# Patient Record
Sex: Female | Born: 1935
Health system: Southern US, Community
[De-identification: ages and names within clinical notes are randomized; demographics above are authoritative.]

## PROBLEM LIST (undated history)

## (undated) DIAGNOSIS — M8580 Other specified disorders of bone density and structure, unspecified site: Secondary | ICD-10-CM

## (undated) DIAGNOSIS — E785 Hyperlipidemia, unspecified: Secondary | ICD-10-CM

## (undated) DIAGNOSIS — I10 Essential (primary) hypertension: Secondary | ICD-10-CM

## (undated) DIAGNOSIS — N2889 Other specified disorders of kidney and ureter: Secondary | ICD-10-CM

## (undated) DIAGNOSIS — N261 Atrophy of kidney (terminal): Secondary | ICD-10-CM

## (undated) HISTORY — DX: Atrophy of kidney (terminal): N26.1

## (undated) HISTORY — PX: OTHER SURGICAL HISTORY: SHX169

## (undated) HISTORY — DX: Hyperlipidemia, unspecified: E78.5

## (undated) HISTORY — DX: Essential (primary) hypertension: I10

## (undated) HISTORY — DX: Other specified disorders of bone density and structure, unspecified site: M85.80

## (undated) HISTORY — DX: Other specified disorders of kidney and ureter: N28.89

## (undated) HISTORY — PX: CATARACT EXTRACTION: SUR2

---

## 2000-09-04 ENCOUNTER — Encounter: Admission: RE | Admit: 2000-09-04 | Discharge: 2000-12-03 | Payer: Self-pay | Admitting: Internal Medicine

## 2002-11-03 ENCOUNTER — Other Ambulatory Visit: Admission: RE | Admit: 2002-11-03 | Discharge: 2002-11-03 | Payer: Self-pay | Admitting: Dermatology

## 2003-09-16 LAB — HM COLONOSCOPY

## 2004-09-01 ENCOUNTER — Ambulatory Visit: Payer: Self-pay | Admitting: Internal Medicine

## 2004-09-16 ENCOUNTER — Ambulatory Visit: Payer: Self-pay | Admitting: Internal Medicine

## 2005-01-19 ENCOUNTER — Ambulatory Visit: Payer: Self-pay | Admitting: Internal Medicine

## 2005-02-16 ENCOUNTER — Ambulatory Visit: Payer: Self-pay | Admitting: Internal Medicine

## 2005-05-17 ENCOUNTER — Ambulatory Visit: Payer: Self-pay | Admitting: Internal Medicine

## 2005-05-30 ENCOUNTER — Ambulatory Visit: Payer: Self-pay | Admitting: Internal Medicine

## 2005-06-01 ENCOUNTER — Ambulatory Visit: Payer: Self-pay

## 2005-06-08 ENCOUNTER — Ambulatory Visit: Payer: Self-pay | Admitting: Internal Medicine

## 2005-09-26 ENCOUNTER — Ambulatory Visit: Payer: Self-pay | Admitting: Internal Medicine

## 2005-10-02 ENCOUNTER — Ambulatory Visit: Payer: Self-pay | Admitting: Internal Medicine

## 2005-10-23 ENCOUNTER — Ambulatory Visit: Payer: Self-pay | Admitting: Internal Medicine

## 2005-11-27 ENCOUNTER — Ambulatory Visit: Payer: Self-pay | Admitting: Internal Medicine

## 2005-12-06 ENCOUNTER — Ambulatory Visit: Payer: Self-pay | Admitting: Internal Medicine

## 2005-12-08 ENCOUNTER — Ambulatory Visit: Payer: Self-pay | Admitting: Internal Medicine

## 2005-12-12 ENCOUNTER — Ambulatory Visit: Payer: Self-pay | Admitting: Internal Medicine

## 2005-12-16 ENCOUNTER — Encounter: Admission: RE | Admit: 2005-12-16 | Discharge: 2005-12-16 | Payer: Self-pay | Admitting: Internal Medicine

## 2006-04-04 ENCOUNTER — Ambulatory Visit: Payer: Self-pay | Admitting: Internal Medicine

## 2006-07-18 ENCOUNTER — Ambulatory Visit: Payer: Self-pay | Admitting: Internal Medicine

## 2006-07-25 ENCOUNTER — Encounter: Admission: RE | Admit: 2006-07-25 | Discharge: 2006-07-25 | Payer: Self-pay | Admitting: Internal Medicine

## 2006-08-08 ENCOUNTER — Ambulatory Visit: Payer: Self-pay | Admitting: Internal Medicine

## 2006-10-31 ENCOUNTER — Ambulatory Visit: Payer: Self-pay | Admitting: Internal Medicine

## 2006-10-31 LAB — CONVERTED CEMR LAB
ALT: 26 units/L (ref 0–40)
AST: 24 units/L (ref 0–37)
Albumin: 4.4 g/dL (ref 3.5–5.2)
Alkaline Phosphatase: 62 units/L (ref 39–117)
BUN: 21 mg/dL (ref 6–23)
Bilirubin, Direct: 0.2 mg/dL (ref 0.0–0.3)
CO2: 29 meq/L (ref 19–32)
Calcium: 10 mg/dL (ref 8.4–10.5)
Chloride: 102 meq/L (ref 96–112)
Cholesterol: 182 mg/dL (ref 0–200)
Creatinine, Ser: 1 mg/dL (ref 0.4–1.2)
GFR calc Af Amer: 69 mL/min
GFR calc non Af Amer: 57 mL/min
Glucose, Bld: 150 mg/dL — ABNORMAL HIGH (ref 70–99)
HDL: 41.9 mg/dL (ref >39.0)
Hgb A1c MFr Bld: 6.9 % — ABNORMAL HIGH (ref 4.6–6.0)
LDL Cholesterol: 103 mg/dL — ABNORMAL HIGH (ref 0–99)
Potassium: 4.6 meq/L (ref 3.5–5.1)
Sodium: 140 meq/L (ref 135–145)
Total Bilirubin: 1.1 mg/dL (ref 0.3–1.2)
Total CHOL/HDL Ratio: 4.3
Total Protein: 7.2 g/dL (ref 6.0–8.3)
Triglycerides: 188 mg/dL — ABNORMAL HIGH (ref 0–149)
VLDL: 38 mg/dL (ref 0–40)

## 2006-11-07 ENCOUNTER — Ambulatory Visit: Payer: Self-pay | Admitting: Internal Medicine

## 2006-12-05 ENCOUNTER — Ambulatory Visit: Payer: Self-pay | Admitting: Internal Medicine

## 2007-02-11 ENCOUNTER — Ambulatory Visit: Payer: Self-pay | Admitting: Internal Medicine

## 2007-02-11 LAB — CONVERTED CEMR LAB
ALT: 20 units/L (ref 0–40)
AST: 21 units/L (ref 0–37)
Albumin: 4 g/dL (ref 3.5–5.2)
Alkaline Phosphatase: 72 units/L (ref 39–117)
BUN: 18 mg/dL (ref 6–23)
Bilirubin, Direct: 0.1 mg/dL (ref 0.0–0.3)
CO2: 31 meq/L (ref 19–32)
Calcium: 9.6 mg/dL (ref 8.4–10.5)
Chloride: 109 meq/L (ref 96–112)
Cholesterol: 289 mg/dL (ref 0–200)
Creatinine, Ser: 0.8 mg/dL (ref 0.4–1.2)
Creatinine,U: 30.7 mg/dL
Direct LDL: 201.3 mg/dL
GFR calc Af Amer: 89 mL/min
GFR calc non Af Amer: 74 mL/min
Glucose, Bld: 118 mg/dL — ABNORMAL HIGH (ref 70–99)
HDL: 39.6 mg/dL (ref >39.0)
Hgb A1c MFr Bld: 6.7 % — ABNORMAL HIGH (ref 4.6–6.0)
Microalb Creat Ratio: 9.8 mg/g (ref 0.0–30.0)
Microalb, Ur: 0.3 mg/dL (ref 0.0–1.9)
Potassium: 4 meq/L (ref 3.5–5.1)
Sodium: 146 meq/L — ABNORMAL HIGH (ref 135–145)
Total Bilirubin: 0.7 mg/dL (ref 0.3–1.2)
Total CHOL/HDL Ratio: 7.3
Total Protein: 7.5 g/dL (ref 6.0–8.3)
Triglycerides: 257 mg/dL (ref 0–149)
VLDL: 51 mg/dL — ABNORMAL HIGH (ref 0–40)

## 2007-04-18 DIAGNOSIS — M8580 Other specified disorders of bone density and structure, unspecified site: Secondary | ICD-10-CM

## 2007-04-18 DIAGNOSIS — I10 Essential (primary) hypertension: Secondary | ICD-10-CM | POA: Insufficient documentation

## 2007-04-18 DIAGNOSIS — E119 Type 2 diabetes mellitus without complications: Secondary | ICD-10-CM

## 2007-04-18 DIAGNOSIS — E785 Hyperlipidemia, unspecified: Secondary | ICD-10-CM

## 2007-06-21 ENCOUNTER — Ambulatory Visit: Payer: Self-pay | Admitting: Internal Medicine

## 2007-06-24 ENCOUNTER — Ambulatory Visit: Payer: Self-pay | Admitting: Cardiology

## 2007-06-24 LAB — CONVERTED CEMR LAB
ALT: 25 units/L (ref 0–35)
AST: 24 units/L (ref 0–37)
Albumin: 4.3 g/dL (ref 3.5–5.2)
Alkaline Phosphatase: 61 units/L (ref 39–117)
BUN: 25 mg/dL — ABNORMAL HIGH (ref 6–23)
Bilirubin, Direct: 0.1 mg/dL (ref 0.0–0.3)
CO2: 27 meq/L (ref 19–32)
Calcium: 9.7 mg/dL (ref 8.4–10.5)
Chloride: 103 meq/L (ref 96–112)
Cholesterol: 241 mg/dL (ref 0–200)
Creatinine, Ser: 0.8 mg/dL (ref 0.4–1.2)
Direct LDL: 183.9 mg/dL
GFR calc Af Amer: 89 mL/min
GFR calc non Af Amer: 74 mL/min
Glucose, Bld: 139 mg/dL — ABNORMAL HIGH (ref 70–99)
HDL: 39 mg/dL (ref >39.0)
Hgb A1c MFr Bld: 6.7 % — ABNORMAL HIGH (ref 4.6–6.0)
Potassium: 4.3 meq/L (ref 3.5–5.1)
Sodium: 139 meq/L (ref 135–145)
Total Bilirubin: 1.1 mg/dL (ref 0.3–1.2)
Total CHOL/HDL Ratio: 6.2
Total Protein: 7.3 g/dL (ref 6.0–8.3)
Triglycerides: 129 mg/dL (ref 0–149)
VLDL: 26 mg/dL (ref 0–40)

## 2007-07-11 ENCOUNTER — Encounter: Payer: Self-pay | Admitting: Internal Medicine

## 2007-08-22 ENCOUNTER — Ambulatory Visit: Payer: Self-pay | Admitting: Internal Medicine

## 2007-11-04 ENCOUNTER — Ambulatory Visit: Payer: Self-pay | Admitting: Internal Medicine

## 2007-11-04 DIAGNOSIS — D4959 Neoplasm of unspecified behavior of other genitourinary organ: Secondary | ICD-10-CM

## 2007-11-04 DIAGNOSIS — I8390 Asymptomatic varicose veins of unspecified lower extremity: Secondary | ICD-10-CM

## 2007-11-04 LAB — CONVERTED CEMR LAB
Bilirubin Urine: NEGATIVE
Glucose, Urine, Semiquant: NEGATIVE
Protein, U semiquant: NEGATIVE
Urobilinogen, UA: 0.2
pH: 5

## 2007-11-05 LAB — CONVERTED CEMR LAB
AST: 27 units/L (ref 0–37)
Albumin: 4.3 g/dL (ref 3.5–5.2)
Bilirubin, Direct: 0.1 mg/dL (ref 0.0–0.3)
Chloride: 103 meq/L (ref 96–112)
Cholesterol: 239 mg/dL (ref 0–200)
Direct LDL: 154.4 mg/dL
GFR calc non Af Amer: 64 mL/min
Glucose, Bld: 135 mg/dL — ABNORMAL HIGH (ref 70–99)
HDL: 39.5 mg/dL (ref >39.0)
Hgb A1c MFr Bld: 6.7 % — ABNORMAL HIGH (ref 4.6–6.0)
Potassium: 5 meq/L (ref 3.5–5.1)
Sodium: 143 meq/L (ref 135–145)
Total Bilirubin: 0.9 mg/dL (ref 0.3–1.2)
Total CHOL/HDL Ratio: 6.1
Triglycerides: 228 mg/dL (ref 0–149)
VLDL: 46 mg/dL — ABNORMAL HIGH (ref 0–40)

## 2008-01-10 ENCOUNTER — Encounter: Payer: Self-pay | Admitting: Internal Medicine

## 2008-01-22 ENCOUNTER — Telehealth: Payer: Self-pay | Admitting: Internal Medicine

## 2008-03-03 ENCOUNTER — Ambulatory Visit: Payer: Self-pay | Admitting: Internal Medicine

## 2008-03-04 LAB — CONVERTED CEMR LAB
AST: 18 units/L (ref 0–37)
CO2: 30 meq/L (ref 19–32)
Chloride: 109 meq/L (ref 96–112)
Glucose, Bld: 113 mg/dL — ABNORMAL HIGH (ref 70–99)
Hgb A1c MFr Bld: 6.7 % — ABNORMAL HIGH (ref 4.6–6.0)
Sodium: 143 meq/L (ref 135–145)
Total CHOL/HDL Ratio: 5.4
Triglycerides: 197 mg/dL — ABNORMAL HIGH (ref 0–149)

## 2008-07-01 ENCOUNTER — Ambulatory Visit: Payer: Self-pay | Admitting: Internal Medicine

## 2008-07-01 DIAGNOSIS — M1990 Unspecified osteoarthritis, unspecified site: Secondary | ICD-10-CM | POA: Insufficient documentation

## 2008-07-02 LAB — CONVERTED CEMR LAB
BUN: 20 mg/dL (ref 6–23)
CO2: 31 meq/L (ref 19–32)
Chloride: 111 meq/L (ref 96–112)
Cholesterol: 191 mg/dL (ref 0–200)
Creatinine, Ser: 0.8 mg/dL (ref 0.4–1.2)
Glucose, Bld: 121 mg/dL — ABNORMAL HIGH (ref 70–99)
HDL: 45 mg/dL (ref >39.0)
Triglycerides: 146 mg/dL (ref 0–149)

## 2008-08-10 ENCOUNTER — Telehealth: Payer: Self-pay | Admitting: Internal Medicine

## 2008-09-08 ENCOUNTER — Ambulatory Visit: Payer: Self-pay | Admitting: Internal Medicine

## 2008-10-14 ENCOUNTER — Telehealth: Payer: Self-pay | Admitting: Internal Medicine

## 2008-10-19 ENCOUNTER — Ambulatory Visit: Payer: Self-pay | Admitting: Internal Medicine

## 2008-10-29 ENCOUNTER — Ambulatory Visit: Payer: Self-pay | Admitting: Internal Medicine

## 2008-11-23 ENCOUNTER — Telehealth: Payer: Self-pay | Admitting: Internal Medicine

## 2008-11-25 ENCOUNTER — Ambulatory Visit: Payer: Self-pay | Admitting: Internal Medicine

## 2008-12-01 ENCOUNTER — Ambulatory Visit: Payer: Self-pay | Admitting: Internal Medicine

## 2008-12-09 ENCOUNTER — Ambulatory Visit: Payer: Self-pay | Admitting: Vascular Surgery

## 2009-01-19 ENCOUNTER — Telehealth: Payer: Self-pay | Admitting: Internal Medicine

## 2009-01-19 ENCOUNTER — Ambulatory Visit: Payer: Self-pay | Admitting: Internal Medicine

## 2009-01-19 LAB — CONVERTED CEMR LAB
ALT: 13 units/L (ref 0–35)
Basophils Relative: 0.3 % (ref 0.0–3.0)
CO2: 29 meq/L (ref 19–32)
Calcium: 9.5 mg/dL (ref 8.4–10.5)
Direct LDL: 147.6 mg/dL
Eosinophils Relative: 1.4 % (ref 0.0–5.0)
Glucose, Bld: 139 mg/dL — ABNORMAL HIGH (ref 70–99)
HCT: 38.6 % (ref 36.0–46.0)
HDL: 39.4 mg/dL (ref >39.00)
Hemoglobin: 13.1 g/dL (ref 12.0–15.0)
Lymphs Abs: 2.4 10*3/uL (ref 0.7–4.0)
MCV: 86.8 fL (ref 78.0–100.0)
Monocytes Absolute: 0.6 10*3/uL (ref 0.1–1.0)
Neutro Abs: 4.1 10*3/uL (ref 1.4–7.7)
Neutrophils Relative %: 57.2 % (ref 43.0–77.0)
RBC: 4.45 M/uL (ref 3.87–5.11)
Sodium: 141 meq/L (ref 135–145)
VLDL: 22.8 mg/dL (ref 0.0–40.0)
WBC: 7.2 10*3/uL (ref 4.5–10.5)

## 2009-02-05 ENCOUNTER — Encounter: Payer: Self-pay | Admitting: Internal Medicine

## 2009-02-11 ENCOUNTER — Ambulatory Visit: Payer: Self-pay | Admitting: Internal Medicine

## 2009-06-01 ENCOUNTER — Ambulatory Visit: Payer: Self-pay | Admitting: Internal Medicine

## 2009-06-01 LAB — CONVERTED CEMR LAB
ALT: 28 units/L (ref 0–35)
AST: 26 units/L (ref 0–37)
CO2: 31 meq/L (ref 19–32)
Chloride: 111 meq/L (ref 96–112)
Cholesterol: 211 mg/dL — ABNORMAL HIGH (ref 0–200)
Creatinine, Ser: 0.8 mg/dL (ref 0.4–1.2)
Direct LDL: 153.4 mg/dL
Sodium: 145 meq/L (ref 135–145)
Total Bilirubin: 0.8 mg/dL (ref 0.3–1.2)
Total CHOL/HDL Ratio: 6

## 2009-06-08 ENCOUNTER — Ambulatory Visit: Payer: Self-pay | Admitting: Internal Medicine

## 2009-07-30 ENCOUNTER — Ambulatory Visit: Payer: Self-pay | Admitting: Internal Medicine

## 2009-08-26 ENCOUNTER — Encounter: Payer: Self-pay | Admitting: Internal Medicine

## 2009-09-22 ENCOUNTER — Ambulatory Visit: Payer: Self-pay | Admitting: Internal Medicine

## 2009-09-22 LAB — CONVERTED CEMR LAB
Albumin: 4.4 g/dL (ref 3.5–5.2)
BUN: 19 mg/dL (ref 6–23)
Calcium: 9.7 mg/dL (ref 8.4–10.5)
Cholesterol: 199 mg/dL (ref 0–200)
Creatinine, Ser: 0.8 mg/dL (ref 0.4–1.2)
GFR calc non Af Amer: 73.26 mL/min (ref >60)
Glucose, Bld: 134 mg/dL — ABNORMAL HIGH (ref 70–99)
HDL: 43.8 mg/dL (ref >39.00)
Hgb A1c MFr Bld: 6.8 % — ABNORMAL HIGH (ref 4.6–6.5)
VLDL: 33.8 mg/dL (ref 0.0–40.0)

## 2009-09-29 ENCOUNTER — Ambulatory Visit: Payer: Self-pay | Admitting: Internal Medicine

## 2009-12-02 ENCOUNTER — Ambulatory Visit: Payer: Self-pay | Admitting: Internal Medicine

## 2009-12-23 ENCOUNTER — Telehealth: Payer: Self-pay | Admitting: *Deleted

## 2010-02-07 ENCOUNTER — Ambulatory Visit: Payer: Self-pay | Admitting: Internal Medicine

## 2010-02-07 LAB — CONVERTED CEMR LAB
ALT: 24 units/L (ref 0–35)
BUN: 15 mg/dL (ref 6–23)
GFR calc non Af Amer: 73.19 mL/min (ref >60)
HDL: 46.8 mg/dL (ref >39.00)
Hgb A1c MFr Bld: 6.9 % — ABNORMAL HIGH (ref 4.6–6.5)
Potassium: 4.1 meq/L (ref 3.5–5.1)
Sodium: 143 meq/L (ref 135–145)
Total Protein: 7.4 g/dL (ref 6.0–8.3)
Triglycerides: 159 mg/dL — ABNORMAL HIGH (ref 0.0–149.0)

## 2010-02-10 ENCOUNTER — Encounter: Payer: Self-pay | Admitting: Internal Medicine

## 2010-02-15 ENCOUNTER — Ambulatory Visit: Payer: Self-pay | Admitting: Internal Medicine

## 2010-06-22 ENCOUNTER — Ambulatory Visit: Payer: Self-pay | Admitting: Internal Medicine

## 2010-06-22 LAB — CONVERTED CEMR LAB
ALT: 19 units/L (ref 0–35)
AST: 22 units/L (ref 0–37)
Alkaline Phosphatase: 73 units/L (ref 39–117)
Calcium: 9.5 mg/dL (ref 8.4–10.5)
Cholesterol: 256 mg/dL — ABNORMAL HIGH (ref 0–200)
Creatinine, Ser: 0.6 mg/dL (ref 0.4–1.2)
GFR calc non Af Amer: 99.98 mL/min (ref >60)
Glucose, Bld: 123 mg/dL — ABNORMAL HIGH (ref 70–99)
Sodium: 142 meq/L (ref 135–145)
Total Bilirubin: 0.6 mg/dL (ref 0.3–1.2)
Triglycerides: 231 mg/dL — ABNORMAL HIGH (ref 0.0–149.0)

## 2010-06-29 ENCOUNTER — Ambulatory Visit: Payer: Self-pay | Admitting: Internal Medicine

## 2010-06-29 LAB — CONVERTED CEMR LAB
Glucose, Urine, Semiquant: NEGATIVE
Nitrite: NEGATIVE
Protein, U semiquant: NEGATIVE
Urobilinogen, UA: 0.2

## 2010-09-16 ENCOUNTER — Telehealth: Payer: Self-pay | Admitting: Internal Medicine

## 2010-09-16 ENCOUNTER — Ambulatory Visit: Payer: Self-pay | Admitting: Internal Medicine

## 2010-10-16 LAB — HM DIABETES EYE EXAM: HM Diabetic Eye Exam: NORMAL

## 2010-10-19 ENCOUNTER — Other Ambulatory Visit: Payer: Self-pay | Admitting: Internal Medicine

## 2010-10-19 ENCOUNTER — Ambulatory Visit
Admission: RE | Admit: 2010-10-19 | Discharge: 2010-10-19 | Payer: Self-pay | Source: Home / Self Care | Attending: Internal Medicine | Admitting: Internal Medicine

## 2010-10-19 LAB — BASIC METABOLIC PANEL
BUN: 21 mg/dL (ref 6–23)
CO2: 26 mEq/L (ref 19–32)
Calcium: 9.4 mg/dL (ref 8.4–10.5)
Chloride: 104 mEq/L (ref 96–112)
Creatinine, Ser: 0.6 mg/dL (ref 0.4–1.2)
GFR: 99.9 mL/min (ref >60.00)
Glucose, Bld: 127 mg/dL — ABNORMAL HIGH (ref 70–99)
Potassium: 4.5 mEq/L (ref 3.5–5.1)
Sodium: 139 mEq/L (ref 135–145)

## 2010-10-19 LAB — HEPATIC FUNCTION PANEL
ALT: 20 U/L (ref 0–35)
AST: 26 U/L (ref 0–37)
Albumin: 4.3 g/dL (ref 3.5–5.2)
Alkaline Phosphatase: 72 U/L (ref 39–117)
Bilirubin, Direct: 0.4 mg/dL — ABNORMAL HIGH (ref 0.0–0.3)
Total Bilirubin: 1.3 mg/dL — ABNORMAL HIGH (ref 0.3–1.2)
Total Protein: 7.8 g/dL (ref 6.0–8.3)

## 2010-10-19 LAB — LIPID PANEL
Cholesterol: 214 mg/dL — ABNORMAL HIGH (ref 0–200)
HDL: 43.4 mg/dL (ref >39.00)
Total CHOL/HDL Ratio: 5
Triglycerides: 175 mg/dL — ABNORMAL HIGH (ref 0.0–149.0)
VLDL: 35 mg/dL (ref 0.0–40.0)

## 2010-10-19 LAB — HEMOGLOBIN A1C: Hgb A1c MFr Bld: 6.9 % — ABNORMAL HIGH (ref 4.6–6.5)

## 2010-10-19 LAB — LDL CHOLESTEROL, DIRECT: Direct LDL: 147.1 mg/dL

## 2010-10-26 ENCOUNTER — Ambulatory Visit
Admission: RE | Admit: 2010-10-26 | Discharge: 2010-10-26 | Payer: Self-pay | Source: Home / Self Care | Attending: Internal Medicine | Admitting: Internal Medicine

## 2010-11-15 NOTE — Progress Notes (Signed)
Summary: Pt wants work in ov with Dr. Cato Mulligan today.Refuses to see diff dr  Phone Note Call from Patient Call back at Home Phone [phone]   Caller: Patient Summary of Call: Pt wants work in ov to see Dr. Cato Mulligan only today for pain in shoulder. Pt refuses to see another doctor. Pt req cortisone shot. Initial call taken by: Lucy Antigua,  September 16, 2010 8:47 AM  Follow-up for Phone Call        Phone Call Completed, Appt Scheduled Today Follow-up by: Alfred Levins, CMA,  September 16, 2010 8:57 AM

## 2010-11-15 NOTE — Progress Notes (Signed)
Summary: Pt has questions re: labs here and for Dr Aaron Edelman office  Phone Note Call from Patient Call back at [facility] Phone [phone]   Caller: Patient Reason for Call: Acute Illness Summary of Call: Pt called and said that Dr. Annabell Howells is wanting to do blood work on pt when she comes in for ov on 02/02/10, but pt already has bloodwork sch with Dr Cato Mulligan on 01/26/10. Pt wants to know if she still needs to have her blood work done here or not? Pt is unsure about what type of Labs Dr Annabell Howells wants to do.          Initial call taken by: Lucy Antigua,  December 23, 2009 2:51 PM  Follow-up for Phone Call        left message on machine suggested she have the lab at dr Aaron Edelman; and they will send dr swords report,therefore call back here about 1 week prior to scheduled lab work and dr swords can look at labs done at dr Annabell Howells and decided what he wants Follow-up by: Willy Eddy, LPN,  December 24, 2009 9:28 AM     Appended Document: Pt has questions re: labs here and for Dr Aaron Edelman office Pt called back to make sure appt was cx for labs at [facility] on 01/26/2010... Pt was instructed (as in previous notation) to have labwork performed at Dr Belva Crome office... Pt adv that she would take instruction sheet from previous appt w/ Dr Cato Mulligan to her lab appt at Dr Belva Crome office to make sure that the labs that Dr Cato Mulligan ordered for her would be included (lipid 272.4/lfts,bmet 995.2/a1c 250.02).... Pt has f/u appt with Dr Cato Mulligan on 02/07/2010 at 10:30am - Pt acknowledged same.

## 2010-11-15 NOTE — Assessment & Plan Note (Signed)
Summary: 4 month rov/njr   Vital Signs:  Patient profile:   77 year old female Height:      63 inches Weight:      129 pounds BMI:     22.93 Temp:     98.5 degrees F oral BP sitting:   150 / 84  (left arm) Cuff size:   regular  Vitals Entered By: Kern Reap CMA Duncan Dull) (June 29, 2010 10:35 AM)  Serial Vital Signs/Assessments:  Time      Position  BP       Pulse  Resp  Temp     By                     132/70                         Birdie Sons MD  CC: follow-up visit, polyuria Is Patient Diabetic? Yes   CC:  follow-up visit and polyuria.  History of Present Illness:  Follow-Up Visit      This is an 77 year old woman who presents for Follow-up visit.  The patient denies chest pain and palpitations.  Since the last visit the patient notes no new problems or concerns.  The patient reports taking meds as prescribed.  When questioned about possible medication side effects, the patient notes none.    All other systems reviewed and were negative   Current Medications (verified): 1)  Simvastatin 20 Mg  Tabs (Simvastatin) .... Once Daily 2)  Losartan Potassium 50 Mg Tabs (Losartan Potassium) .... Take 1 Tablet By Mouth Once A Day 3)  Cipro 250 Mg Tabs (Ciprofloxacin Hcl) .Marland Kitchen.. 1 By Mouth 2 Times Daily  Allergies: 1)  Lisinopril  Past History:  Past Medical History: Last updated: 11/04/2007 Diabetes mellitus, type II Hyperlipidemia Hypertension Osteopenia kidney mass atrophic left kidney  Past Surgical History: Last updated: 07/01/2008 Foot sx-spur  Family History: Last updated: 12/01/2008 non contributory  Social History: Last updated: 06/21/2007 Never Smoked widow  Risk Factors: Smoking Status: never (02/15/2010)  Review of Systems       Flu Vaccine Consent Questions     Do you have a history of severe allergic reactions to this vaccine? no    Any prior history of allergic reactions to egg and/or gelatin? no    Do you have a sensitivity to the  preservative Thimersol? no    Do you have a past history of Guillan-Barre Syndrome? no    Do you currently have an acute febrile illness? no    Have you ever had a severe reaction to latex? no    Vaccine information given and explained to patient? yes    Are you currently pregnant? no    Lot Number:[id number]   Exp Date:04/15/2011   Site Given  Left Deltoid IM   Physical Exam  General:  Well-developed,well-nourished,in no acute distress; alert,appropriate and cooperative throughout examination Head:  normocephalic and atraumatic.   Eyes:  pupils equal and pupils round.   Ears:  R ear normal and L ear normal.   Neck:  No deformities, masses, or tenderness noted. Chest Wall:  no deformities and no tenderness.   Lungs:  normal respiratory effort and no intercostal retractions.   Heart:  normal rate and regular rhythm.   Abdomen:  soft and non-tender.   Skin:  turgor normal, color normal, and no rashes.   Psych:  good eye contact and not anxious appearing.  Impression & Recommendations:  Problem # 1:  VARICOSE VEIN (ICD-456.8) no treatment necessary  Problem # 2:  UTI (ICD-599.0) see abx side effects dkscussed Her updated medication list for this problem includes:    Cipro 250 Mg Tabs (Ciprofloxacin hcl) .Marland Kitchen... 1 by mouth 2 times daily  Problem # 3:  DIABETES MELLITUS, TYPE II (ICD-250.00)  Her updated medication list for this problem includes:    Losartan Potassium 50 Mg Tabs (Losartan potassium) .Marland Kitchen... Take 1 tablet by mouth once a day  Problem # 4:  HYPERLIPIDEMIA (ICD-272.4)  Her updated medication list for this problem includes:    Simvastatin 20 Mg Tabs (Simvastatin) ..... Once daily  Complete Medication List: 1)  Simvastatin 20 Mg Tabs (Simvastatin) .... Once daily 2)  Losartan Potassium 50 Mg Tabs (Losartan potassium) .... Take 1 tablet by mouth once a day 3)  Cipro 250 Mg Tabs (Ciprofloxacin hcl) .Marland Kitchen.. 1 by mouth 2 times daily  Other Orders: UA Dipstick w/o  Micro (automated)  (81003) Admin 1st Vaccine (16109) Flu Vaccine 56yrs + (60454)  Patient Instructions: 1)  Please schedule a follow-up appointment in 4 months. 2)  labs one week prior to visit 3)  lipids---272.4 4)  lfts-995.2 5)  bmet-995.2 6)  A1C-250.02 7)     Prescriptions: SIMVASTATIN 20 MG  TABS (SIMVASTATIN) once daily  #90 Tablet x 3   Entered and Authorized by:   Birdie Sons MD   Signed by:   Birdie Sons MD on 06/29/2010   Method used:   Electronically to        [facility] (retail)       [address] [place], [place]  [address]       Ph: [phone]       Fax: [phone]   RxID:   [id number] CIPRO 250 MG TABS (CIPROFLOXACIN HCL) 1 by mouth 2 times daily  #10 x 0   Entered and Authorized by:   Birdie Sons MD   Signed by:   Birdie Sons MD on 06/29/2010   Method used:   Electronically to        [facility] (retail)       [address]       [place], [place]  [address]       Ph: [phone]       Fax: [phone]   RxID:   [id number]   Laboratory Results   Urine Tests  Date/Time Recieved: June 29, 2010 10:43 AM  Date/Time Reported: June 29, 2010 10:43 AM   Routine Urinalysis   Color: yellow Appearance: Clear Glucose: negative   (Normal Range: Negative) Bilirubin: negative   (Normal Range: Negative) Ketone: negative   (Normal Range: Negative) Spec. Gravity: 1.025   (Normal Range: 1.003-1.035) Blood: negative   (Normal Range: Negative) pH: 5.0   (Normal Range: 5.0-8.0) Protein: negative   (Normal Range: Negative) Urobilinogen: 0.2   (Normal Range: 0-1) Nitrite: negative   (Normal Range: Negative) Leukocyte Esterace: 2+   (Normal Range: Negative)    Comments: Wynona Canes, CMA  June 29, 2010 10:43 AM

## 2010-11-15 NOTE — Assessment & Plan Note (Signed)
Summary: swelling around ankles/cjr   Vital Signs:  Patient profile:   77 year old female Weight:      132 pounds Temp:     98.3 degrees F oral Pulse rate:   72 / minute Resp:     12 per minute BP sitting:   124 / 78  (left arm)  Vitals Entered By: Gladis Riffle, RN (December 02, 2009 10:58 AM)  Procedure Note Last Tetanus: Historical (10/16/2005)  Injections: Duration of symptoms: months  Procedure # 1: joint aspiration & injection    Location: knee    Medication: 40 mg depomedrol    Anesthesia: 1% lidocaine w/o epinephrine    Comment: verbal consent  CC: c/o swelling left leg x 2 days with pain at inner aspect knee down leg Is Patient Diabetic? Yes Did you bring your meter with you today? No   CC:  c/o swelling left leg x 2 days with pain at inner aspect knee down leg.  History of Present Illness: knee pain no swelling no known acute trauma rates pain 5/10 duration---progressive for 6 weeks  All other systems reviewed and were negative   Preventive Screening-Counseling & Management  Alcohol-Tobacco     Smoking Status: never  Medications Prior to Update: 1)  Simvastatin 20 Mg  Tabs (Simvastatin) .... Once Daily 2)  Amlodipine Besylate 10 Mg  Tabs (Amlodipine Besylate) .... Once Daily  Allergies: 1)  Lisinopril  Past History:  Past Medical History: Last updated: 11/04/2007 Diabetes mellitus, type II Hyperlipidemia Hypertension Osteopenia kidney mass atrophic left kidney  Past Surgical History: Last updated: 07/01/2008 Foot sx-spur  Family History: Last updated: 12/01/2008 non contributory  Social History: Last updated: 06/21/2007 Never Smoked widow  Risk Factors: Smoking Status: never (12/02/2009)  Review of Systems       All other systems reviewed and were negative   Physical Exam  General:  Well-developed,well-nourished,in no acute distress; alert,appropriate and cooperative throughout examination Head:  normocephalic and  atraumatic.   Eyes:  pupils equal and pupils round.   Ears:  R ear normal and L ear normal.   Neck:  No deformities, masses, or tenderness noted. Chest Wall:  no deformities and no tenderness.   Lungs:  Normal respiratory effort, chest expands symmetrically. Lungs are clear to auscultation, no crackles or wheezes. Abdomen:  soft and non-tender.   Neurologic:  cranial nerves II-XII intact and gait normal.     Impression & Recommendations:  Problem # 1:  OSTEOARTHROSIS, UNSPECIFIED SITE (ICD-715.90)  knee discussed trial voltaren gel knee injection---verbal consent  Orders: Joint Aspirate / Injection, Large (20610) Depo- Medrol 40mg  (J1030)  Complete Medication List: 1)  Simvastatin 20 Mg Tabs (Simvastatin) .... Once daily 2)  Amlodipine Besylate 10 Mg Tabs (Amlodipine besylate) .... Once daily 3)  Voltaren 1 % Gel (Diclofenac sodium) .... Apply to left knee two times a day for 5 days Prescriptions: VOLTAREN 1 % GEL (DICLOFENAC SODIUM) apply to left knee two times a day for 5 days  #1 tube x 0   Entered and Authorized by:   Birdie Sons MD   Signed by:   Birdie Sons MD on 12/02/2009   Method used:   Electronically to        [facility] (retail)       [address] [place], [place]  [address]       Ph: [phone]  Fax: [phone]   RxID:   [id number]

## 2010-11-15 NOTE — Assessment & Plan Note (Signed)
Summary: shoulder pain/cdw   Vital Signs:  Patient profile:   77 year old female Weight:      130 pounds Temp:     98.7 degrees F oral BP sitting:   162 / 80  (left arm) Cuff size:   regular  Vitals Entered By: Alfred Levins, CMA (September 16, 2010 9:47 AM) CC: rt shoulder pain   CC:  rt shoulder pain.  Current Medications (verified): 1)  Simvastatin 20 Mg  Tabs (Simvastatin) .... Once Daily 2)  Losartan Potassium 50 Mg Tabs (Losartan Potassium) .... Take 1 Tablet By Mouth Once A Day  Allergies (verified): 1)  Lisinopril   Impression & Recommendations:  Problem # 1:  OSTEOARTHROSIS, UNSPECIFIED SITE (ICD-715.90)  chronic shoulder pain. Progressively worse. Discussed risks and benefits of shoulder injection. She agreed to proceed. Verbal consent obtained. Right shoulder prepped and draped in a sterile fashion. Skin was infiltrated with 1% lidocaine. 22-gauge needle inserted in the subacromial space. 40 mg Depo-Medrol injected. No complications. Patient will continue to move shoulder.  Orders: Joint Aspirate / Injection, Large (20610) Depo- Medrol 40mg  (J1030)  Complete Medication List: 1)  Simvastatin 20 Mg Tabs (Simvastatin) .... Once daily 2)  Losartan Potassium 50 Mg Tabs (Losartan potassium) .... Take 1 tablet by mouth once a day   Orders Added: 1)  Joint Aspirate / Injection, Large [20610] 2)  Depo- Medrol 40mg  [J1030]

## 2010-11-15 NOTE — Letter (Signed)
Summary: [facility]  [facility]   Imported By: Maryln Gottron 02/23/2010 15:58:14  _____________________________________________________________________  External Attachment:    Type:   Image     Comment:   External Document

## 2010-11-15 NOTE — Assessment & Plan Note (Signed)
Summary: 4 month rov./njr/pt rsc/cjr/PT RESCD//CCM   Vital Signs:  Patient profile:   77 year old female Weight:      130 pounds Temp:     98.9 degrees F oral Pulse rate:   78 / minute Pulse rhythm:   regular Resp:     12 per minute BP sitting:   128 / 72  (left arm) Cuff size:   regular  Vitals Entered By: Gladis Riffle, RN (Feb 15, 2010 10:51 AM) CC: 4 month rov, labs done--states co-q 10 helping right leg, but left still hurting Is Patient Diabetic? No   CC:  4 month rov, labs done--states co-q 10 helping right leg, and but left still hurting.  History of Present Illness:  Follow-Up Visit      This is an 77 year old woman who presents for Follow-up visit.  The patient denies chest pain and palpitations.  Since the last visit the patient notes no new problems or concerns.  The patient reports taking meds as prescribed.  When questioned about possible medication side effects, the patient notes none except notes lower extremity edema.   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 Once Daily 2)  Amlodipine Besylate 10 Mg  Tabs (Amlodipine Besylate) .... Once Daily 3)  Voltaren 1 % Gel (Diclofenac Sodium) .... Apply To Left Knee Two Times A Day For 5 Days 4)  Co Q-10 30 Mg Caps (Coenzyme Q10) .... Two Times A Day  Allergies: 1)  Lisinopril  Family History: Reviewed history from 12/01/2008 and no changes required. non contributory  Social  History: Reviewed history from 06/21/2007 and no changes required. Never Smoked widow  Physical Exam  General:  Well-developed,well-nourished,in no acute distress; alert,appropriate and cooperative throughout examination Head:  normocephalic and atraumatic.   Eyes:  pupils equal and pupils round.   Ears:  R ear normal and L ear normal.   Nose:  no external deformity and no external erythema.   Neck:  No deformities, masses, or tenderness noted. Lungs:  normal respiratory effort.   Heart:  normal rate and regular rhythm.   Abdomen:  soft and non-tender.   Msk:  No deformity or scoliosis noted of thoracic or lumbar spine.   Neurologic:  cranial nerves II-XII intact and gait normal.     Impression & Recommendations:  Problem # 1:  VARICOSE VEIN (ICD-456.8) stable may be contributing to edema  Problem # 2:  HYPERTENSION (ICD-401.9) controlled but edema caused by amlodipine--will dc try losartan The following medications were removed from the medication list:    Amlodipine Besylate 10 Mg Tabs (Amlodipine besylate) ..... Once daily Her updated medication list for this problem includes:    Losartan Potassium 50 Mg Tabs (Losartan potassium) .Marland Kitchen... Take 1 tablet by mouth once a day  BP today: 128/72 Prior BP: 124/78 (12/02/2009)  Prior 10 Yr Risk Heart Disease: > 32 % (07/01/2008)  Labs Reviewed: K+: 4.1 (02/07/2010) Creat: : 0.8 (02/07/2010)   Chol: 203 (02/07/2010)   HDL: 46.80 (  02/07/2010)   LDL: 121 (09/22/2009)   TG: 159.0 (02/07/2010)  Problem # 3:  DISORDER, KIDNEY/URETER NOS (ICD-593.9)  reviewed dr wrenn's note---no f/u necessary  Problem # 4:  DIABETES MELLITUS, TYPE II (ICD-250.00) controlled no meds start ARB for BP Her updated medication list for this problem includes:    Losartan Potassium 50 Mg Tabs (Losartan potassium) .Marland Kitchen... Take 1 tablet by mouth once a day  Labs Reviewed: Creat: 0.8 (02/07/2010)     Last Eye Exam: normal (03/16/2009) Reviewed HgBA1c  results: 6.9 (02/07/2010)  6.8 (09/22/2009)  Complete Medication List: 1)  Simvastatin 20 Mg Tabs (Simvastatin) .... Once daily 2)  Voltaren 1 % Gel (Diclofenac sodium) .... Apply to left knee two times a day for 5 days 3)  Co Q-10 30 Mg Caps (Coenzyme q10) .... Two times a day 4)  Losartan Potassium 50 Mg Tabs (Losartan potassium) .... Take 1 tablet by mouth once a day  Patient Instructions: 1)  Please schedule a follow-up appointment in 4 months. 2)  labs one week prior to visit 3)  lipids---272.4 4)  lfts-995.2 5)  bmet-995.2 6)  A1C-250.02 7)     Prescriptions: LOSARTAN POTASSIUM 50 MG TABS (LOSARTAN POTASSIUM) Take 1 tablet by mouth once a day  #90 x 3   Entered and Authorized by:   Birdie Sons MD   Signed by:   Birdie Sons MD on 02/15/2010   Method used:   Electronically to        [facility] (retail)       [address] [place], [place]  [address]       Ph: [phone]       Fax: [phone]   RxID:   [id number]

## 2010-11-17 NOTE — Assessment & Plan Note (Signed)
Summary: 4 month fup//ccm   Vital Signs:  Patient profile:   77 year old female Weight:      133 pounds Temp:     98.9 degrees F oral Pulse rate:   68 / minute Pulse rhythm:   regular BP sitting:   132 / 74  (left arm) Cuff size:   regular  Vitals Entered By: Alfred Levins, CMA (October 26, 2010 10:11 AM)  Procedure Note Last Tetanus: Historical (10/16/2005)  Injections: Indication: chronic pain  Procedure # 1: joint aspiration & injection    Region: knee    Technique: 20 g needle    Medication: 40 mg depomedrol  CC: discuss lab results   CC:  discuss lab results.  History of Present Illness:  Follow-Up Visit      This is an 77 year old woman who presents for Follow-up visit.  The patient denies chest pain, palpitations, and SOB.  Since the last visit the patient notes no new problems or concerns---has had recurrent L knee pain---previous steroid injection with relief.  The patient reports taking meds as prescribed.  When questioned about possible medication side effects, the patient notes none.    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 Once Daily 2)  Losartan Potassium 50 Mg Tabs (Losartan Potassium) .... Take 1 Tablet By Mouth Once A Day  Allergies (verified): 1)  Lisinopril  Past History:  Past Medical History: Last updated: 11/04/2007 Diabetes mellitus, type II Hyperlipidemia Hypertension Osteopenia kidney mass atrophic left kidney  Past Surgical History: Last updated: 07/01/2008 Foot sx-spur  Family History: Last updated: 12/01/2008 non  contributory  Social History: Last updated: 06/21/2007 Never Smoked widow  Risk Factors: Smoking Status: never (02/15/2010)  Physical Exam  General:  Well-developed,well-nourished,in no acute distress; alert,appropriate and cooperative throughout examination Head:  normocephalic and atraumatic.   Eyes:  pupils equal and pupils round.   Neck:  No deformities, masses, or tenderness noted. Lungs:  normal respiratory effort and no intercostal retractions.   Heart:  normal rate and regular rhythm.   Abdomen:  soft and non-tender.   Skin:  turgor normal and color normal.   Psych:  normally interactive and good eye contact.    Diabetes Management Exam:    Eye Exam:       Eye Exam done elsewhere          Date: 10/16/2010          Results: normal-pt's report          Done by: ophthalm   Impression & Recommendations:  Problem # 1:  OSTEOARTHROSIS, UNSPECIFIED SITE (ICD-715.90)  knee  Orders: Depo- Medrol 40mg  (J1030) Joint Aspirate / Injection, Large (16109)  Problem # 2:  HYPERTENSION (ICD-401.9) controlled continue current medications  Her updated medication list for this problem includes:    Losartan Potassium 50 Mg Tabs (Losartan potassium) .Marland Kitchen... Take 1 tablet by mouth once a day  BP today: 132/74 Prior BP: 162/80 (09/16/2010)  Prior 10 Yr Risk Heart Disease: > 32 % (07/01/2008)  Labs Reviewed: K+: 4.5 (10/19/2010) Creat: : 0.6 (10/19/2010)   Chol: 214 (10/19/2010)   HDL: 43.40 (  10/19/2010)   LDL: 121 (09/22/2009)   TG: 175.0 (10/19/2010)  Problem # 3:  HYPERLIPIDEMIA (ICD-272.4) controlled continue current medications  Her updated medication list for this problem includes:    Simvastatin 20 Mg Tabs (Simvastatin) ..... Once daily  Labs Reviewed: SGOT: 26 (10/19/2010)   SGPT: 20 (10/19/2010)  Prior 10 Yr Risk Heart Disease: > 32 % (07/01/2008)   HDL:43.40 (10/19/2010), 41.40 (06/22/2010)  LDL:121 (09/22/2009), 117 (07/01/2008)  Chol:214 (10/19/2010), 256  (06/22/2010)  Trig:175.0 (10/19/2010), 231.0 (06/22/2010)  Complete Medication List: 1)  Simvastatin 20 Mg Tabs (Simvastatin) .... Once daily 2)  Losartan Potassium 50 Mg Tabs (Losartan potassium) .... Take 1 tablet by mouth once a day  Patient Instructions: 1)  Please schedule a follow-up appointment in 4 months. 2)  labs one week prior to visit 3)  lipids---272.4 4)  lfts-995.2 5)  bmet-995.2 6)  A1C-250.02 7)       Orders Added: 1)  Est. Patient Level IV [16109] 2)  Depo- Medrol 40mg  [J1030] 3)  Joint Aspirate / Injection, Large [20610]

## 2011-02-20 ENCOUNTER — Other Ambulatory Visit (INDEPENDENT_AMBULATORY_CARE_PROVIDER_SITE_OTHER): Payer: PRIVATE HEALTH INSURANCE | Admitting: Internal Medicine

## 2011-02-20 DIAGNOSIS — T887XXA Unspecified adverse effect of drug or medicament, initial encounter: Secondary | ICD-10-CM

## 2011-02-20 DIAGNOSIS — IMO0001 Reserved for inherently not codable concepts without codable children: Secondary | ICD-10-CM

## 2011-02-20 DIAGNOSIS — E785 Hyperlipidemia, unspecified: Secondary | ICD-10-CM

## 2011-02-20 LAB — BASIC METABOLIC PANEL
CO2: 26 mEq/L (ref 19–32)
Glucose, Bld: 123 mg/dL — ABNORMAL HIGH (ref 70–99)
Potassium: 4.3 mEq/L (ref 3.5–5.1)
Sodium: 139 mEq/L (ref 135–145)

## 2011-02-20 LAB — HEPATIC FUNCTION PANEL
ALT: 24 U/L (ref 0–35)
Alkaline Phosphatase: 69 U/L (ref 39–117)
Bilirubin, Direct: 0.1 mg/dL (ref 0.0–0.3)
Total Protein: 7 g/dL (ref 6.0–8.3)

## 2011-02-22 ENCOUNTER — Telehealth: Payer: Self-pay | Admitting: Internal Medicine

## 2011-02-22 MED ORDER — CIPROFLOXACIN HCL 500 MG PO TABS: 500.0000 mg | ORAL_TABLET | Freq: Two times a day (BID) | ORAL | Status: AC

## 2011-02-22 MED ORDER — METRONIDAZOLE 500 MG PO TABS: 500.0000 mg | ORAL_TABLET | Freq: Three times a day (TID) | ORAL | Status: AC

## 2011-02-22 NOTE — Telephone Encounter (Signed)
Pt has ? diverticulitis flare up. Pt in a lot of pt. Pt req work in appt with Dr Cato Mulligan today. Pt refuses to see another doctor. Pt says that if she can not be work in, pt req med be called in to [facility].

## 2011-02-22 NOTE — Telephone Encounter (Signed)
cipro 500 bid for 7 days Metronidazole 500 tid for 7 days  Call for fever

## 2011-02-22 NOTE — Telephone Encounter (Signed)
Notified pt of Dr. Marliss Coots recommendations.

## 2011-02-24 ENCOUNTER — Encounter: Payer: Self-pay | Admitting: Internal Medicine

## 2011-02-27 ENCOUNTER — Ambulatory Visit (INDEPENDENT_AMBULATORY_CARE_PROVIDER_SITE_OTHER): Payer: PRIVATE HEALTH INSURANCE | Admitting: Internal Medicine

## 2011-02-27 ENCOUNTER — Encounter: Payer: Self-pay | Admitting: Internal Medicine

## 2011-02-27 DIAGNOSIS — E119 Type 2 diabetes mellitus without complications: Secondary | ICD-10-CM

## 2011-02-27 DIAGNOSIS — I10 Essential (primary) hypertension: Secondary | ICD-10-CM

## 2011-02-27 DIAGNOSIS — E785 Hyperlipidemia, unspecified: Secondary | ICD-10-CM

## 2011-02-27 NOTE — Progress Notes (Signed)
  Subjective:    Patient ID: Linda Golden, female    DOB: 1927/04/26, 76 y.o.   MRN: [id number]  HPI  emperically treated for diverticulitis---she is much better Still on ABX   patient comes in for followup of multiple medical problems including type 2 diabetes, hyperlipidemia, hypertension. The patient does not check blood sugar or blood pressure at home. The patetient does not follow an exercise or diet program. The patient denies any polyuria, polydipsia.  In the past the patient has gone to diabetic treatment center. The patient is tolerating medications  Without difficulty. The patient does admit to medication compliance.    Past Medical History  Diagnosis Date  . Diabetes mellitus   . Hyperlipidemia   . Hypertension   . Osteopenia   . Kidney mass   . Atrophy kidney    Past Surgical History  Procedure Date  . Foot spur     reports that she has never smoked. She does not have any smokeless tobacco history on file. Her alcohol and drug histories not on file. family history is not on file. Allergies  Allergen Reactions  . Lisinopril     REACTION: cough     Review of Systems  patient denies chest pain, shortness of breath, orthopnea. Denies lower extremity edema, abdominal pain, change in appetite, change in bowel movements. Patient denies rashes, musculoskeletal complaints. No other specific complaints in a complete review of systems.      Objective:   Physical Exam  Well-developed well-nourished female in no acute distress. HEENT exam atraumatic, normocephalic, extraocular muscles are intact. Neck is supple. No jugular venous distention no thyromegaly. Chest clear to auscultation without increased work of breathing. Cardiac exam S1 and S2 are regular. Abdominal exam active bowel sounds, soft, nontender. Extremities no edema. Neurologic exam she is alert without any motor sensory deficits. Gait is normal.        Assessment & Plan:

## 2011-02-27 NOTE — Assessment & Plan Note (Signed)
Not controlled Discussed diet She refuses to change meds at this time

## 2011-02-27 NOTE — Assessment & Plan Note (Signed)
Controlled Continue meds 

## 2011-02-27 NOTE — Assessment & Plan Note (Signed)
Controlled Continue off all meds

## 2011-02-28 NOTE — Procedures (Signed)
LOWER EXTREMITY VENOUS REFLUX EXAM   INDICATION:  Left lower extremity varicose vein with pain.   EXAM:  Using color-flow imaging and pulse Doppler spectral analysis, the  left common femoral, superficial femoral, popliteal, posterior tibial,  greater and lesser saphenous veins are evaluated.  There is no evidence  suggesting deep venous insufficiency in the left lower extremity.   The left saphenofemoral junction is not competent.  The left GSV is not  competent with the caliber as described below.   The left proximal short saphenous vein demonstrates competency.   GSV Diameter (used if found to be incompetent only)                                            Right    Left  Proximal Greater Saphenous Vein           cm       1.60 cm  Proximal-to-mid-thigh                     cm       1.60 cm  Mid thigh                                 cm       0.53 cm  Mid-distal thigh                          cm       0.53 cm  Distal thigh                              cm       0.59 cm  Knee                                      cm       0.60 cm   IMPRESSION:  1. Left greater saphenous vein reflux is identified with the caliber      ranging from 0.60 cm to 1.05 cm knee to groin.  2. The left greater saphenous vein is not aneurysmal.  3. The left greater saphenous vein is not tortuous.  4. The deep venous system is competent.  5. The left lesser saphenous vein is competent.  6. No evidence of deep venous thrombosis noted in the left leg.   ___________________________________________  Larina Earthly, M.D.   MG/MEDQ  D:  12/09/2008  T:  12/10/2008  Job:  [id number]

## 2011-02-28 NOTE — Consult Note (Signed)
NEW PATIENT CONSULTATION   Linda Golden, Linda Golden  DOB:  [birth year]                                       12/09/2008  JYNWG#:[id number]   The patient presents today for evaluation of left leg venous  hypertension and varicose veins.  She is a very pleasant active 77-year-  old white female with progressive enlargement of varicosities in her  left posterior calf.  She does report some chronic swelling and some  aching sensation with prolonged standing in her distal calf.  She does  not have any prior history of deep venous thrombosis, superficial  thrombophlebitis or bleeding.   PAST MEDICAL HISTORY:  Significant for non-insulin-dependent diabetes,  hypertension, elevated cholesterol.   FAMILY HISTORY:  Negative for premature atherosclerotic disease.   SOCIAL HISTORY:  She is widowed with 1 child.  She does not smoke or  drink alcohol.   REVIEW OF SYSTEMS:  Weight is reported at 150 pounds.  She is 5 feet 4  inches tall.  She denies any cardiac, pulmonary, GI or GU dysfunction.  She does have arthritis and difficulty hearing.   MEDICATION ALLERGIES:  None.   CURRENT MEDICATIONS:  Simvastatin and amlodipine.   PHYSICAL EXAMINATION:  Vital signs:  Blood pressure is 177/74, pulse 74,  respirations 18.  General:  She is a well-developed, well-nourished  white female appearing younger than her stated age of 23.  She is  grossly intact neurologically.  She has 2+ radial and 2+ dorsalis pedis  pulses bilaterally.  Her right leg does not have any evidence of venous  pathology.  On her left leg she does have a marked nest of varices in  her left posterior calf.  She does not have any evidence of ulceration  although she does have slight swelling in her left ankle versus right.   She underwent a formal venous duplex in our office today and I reviewed  this result with the patient.  This does show a gross reflux throughout  her great saphenous vein from her groin distally  on the left.  She does  not have any reflux in her small saphenous vein and does not have any  reflux in her deep system.  I have discussed this at length with the  patient.  I explained that the venous hypertension related to her  saphenous vein incompetence is what is causing the swelling and  varicosities in her posterior calf.  She has had some symptomatic relief  with compression garments.  She reports that the discomfort is tolerable  and that she was simply concerned regarding any more serious problem  associated with this.  I reassured her that it is perfectly safe to  continue observation only without treatment.  I did explain the option  of laser ablation and stab phlebectomy as an outpatient procedure under  local in our office but would recommend this only for progressive  symptoms which she currently is not having.  We did fit her for slightly  higher compression than she is currently using with knee night 20-30 mm  compression garments and instructed her on their daily use.  She was  comfortable with this discussion and will see [place] again on an as needed  basis.   Larina Earthly, M.D.  Electronically Signed   TFE/MEDQ  D:  12/09/2008  T:  12/10/2008  Job:  [id number]   cc:   Valetta Mole. Swords, MD

## 2011-03-11 ENCOUNTER — Other Ambulatory Visit: Payer: Self-pay | Admitting: Internal Medicine

## 2011-06-06 ENCOUNTER — Encounter: Payer: Self-pay | Admitting: Family Medicine

## 2011-06-06 ENCOUNTER — Telehealth: Payer: Self-pay | Admitting: *Deleted

## 2011-06-06 ENCOUNTER — Ambulatory Visit (INDEPENDENT_AMBULATORY_CARE_PROVIDER_SITE_OTHER): Payer: PRIVATE HEALTH INSURANCE | Admitting: Family Medicine

## 2011-06-06 VITALS — BP 120/80 | HR 73 | Temp 98.4°F | Wt 131.0 lb

## 2011-06-06 DIAGNOSIS — M79603 Pain in arm, unspecified: Secondary | ICD-10-CM

## 2011-06-06 DIAGNOSIS — M79609 Pain in unspecified limb: Secondary | ICD-10-CM

## 2011-06-06 MED ORDER — HYDROCODONE-ACETAMINOPHEN 5-325 MG PO TABS: 1.0000 | ORAL_TABLET | Freq: Four times a day (QID) | ORAL | Status: AC | PRN

## 2011-06-06 NOTE — Telephone Encounter (Signed)
Called c/o rt arm pain - states she had an episode on Friday night with rapid heart beat and tightness in her chest- states pulse was 88--today bp is 267/73 and pulse is 67.  Per drj enkins- may see md this pm- appointment with dr fry put pt instructed to go to er before then if sx get worse or chest pain and numbness anywhere on body

## 2011-06-06 NOTE — Progress Notes (Signed)
  Subjective:    Patient ID: Linda Golden, female    DOB: October 10, 1927, 77 y.o.   MRN: [id number]  HPI Here for 2 days of non-stop pain in the right arm. No recent trauma. No neck or back pain. It started yesterday evening, and it has been present ever since. No chest pain or SOB or seats or nausea. Using Tylenol. The pain is difficult for her to describe but it seems to be a dull, achy type of pain. It is not sharp or burning. No rashes are seen. No swelling or numbness in the hand or arm.   Review of Systems  Constitutional: Negative.   Respiratory: Negative.   Cardiovascular: Negative.        Objective:   Physical Exam  Constitutional: She appears well-developed and well-nourished.  Cardiovascular: Normal rate, regular rhythm, normal heart sounds and intact distal pulses.   Pulmonary/Chest: Effort normal and breath sounds normal.  Musculoskeletal:       The arm is slightly tender in the upper arm, but no swelling is noted. No cords. Distal circulation is intact. The shoulder has full ROM and is not tender. The neck and back are not tender   Skin: Skin is warm. No rash noted. No erythema. No pallor.          Assessment & Plan:  Arm pain of unclear etiology. Treat with Vicodin. She is to watch for swelling in the hand or any rashes on the arm. Recheck prn

## 2011-06-23 ENCOUNTER — Other Ambulatory Visit (INDEPENDENT_AMBULATORY_CARE_PROVIDER_SITE_OTHER): Payer: PRIVATE HEALTH INSURANCE

## 2011-06-23 DIAGNOSIS — E119 Type 2 diabetes mellitus without complications: Secondary | ICD-10-CM

## 2011-06-23 LAB — LIPID PANEL
Cholesterol: 210 mg/dL — ABNORMAL HIGH (ref 0–200)
HDL: 44.1 mg/dL (ref >39.00)
Triglycerides: 162 mg/dL — ABNORMAL HIGH (ref 0.0–149.0)
VLDL: 32.4 mg/dL (ref 0.0–40.0)

## 2011-06-23 LAB — BASIC METABOLIC PANEL
Chloride: 105 mEq/L (ref 96–112)
GFR: 66.21 mL/min (ref >60.00)
Potassium: 4 mEq/L (ref 3.5–5.1)
Sodium: 140 mEq/L (ref 135–145)

## 2011-06-23 LAB — HEMOGLOBIN A1C: Hgb A1c MFr Bld: 6.9 % — ABNORMAL HIGH (ref 4.6–6.5)

## 2011-06-23 LAB — HEPATIC FUNCTION PANEL
ALT: 24 U/L (ref 0–35)
Albumin: 4.2 g/dL (ref 3.5–5.2)
Total Protein: 7.4 g/dL (ref 6.0–8.3)

## 2011-06-30 ENCOUNTER — Ambulatory Visit: Payer: PRIVATE HEALTH INSURANCE

## 2011-07-27 ENCOUNTER — Ambulatory Visit (INDEPENDENT_AMBULATORY_CARE_PROVIDER_SITE_OTHER): Payer: PRIVATE HEALTH INSURANCE

## 2011-07-27 DIAGNOSIS — Z23 Encounter for immunization: Secondary | ICD-10-CM

## 2011-08-24 ENCOUNTER — Other Ambulatory Visit: Payer: Self-pay | Admitting: *Deleted

## 2011-08-24 MED ORDER — SIMVASTATIN 20 MG PO TABS: 20.0000 mg | ORAL_TABLET | Freq: Every day | ORAL | Status: DC

## 2011-10-27 ENCOUNTER — Ambulatory Visit (INDEPENDENT_AMBULATORY_CARE_PROVIDER_SITE_OTHER): Payer: PRIVATE HEALTH INSURANCE | Admitting: Internal Medicine

## 2011-10-27 ENCOUNTER — Encounter: Payer: Self-pay | Admitting: Internal Medicine

## 2011-10-27 DIAGNOSIS — I10 Essential (primary) hypertension: Secondary | ICD-10-CM

## 2011-10-27 DIAGNOSIS — E119 Type 2 diabetes mellitus without complications: Secondary | ICD-10-CM

## 2011-10-27 DIAGNOSIS — N390 Urinary tract infection, site not specified: Secondary | ICD-10-CM

## 2011-10-27 DIAGNOSIS — E785 Hyperlipidemia, unspecified: Secondary | ICD-10-CM

## 2011-10-27 LAB — HEMOGLOBIN A1C: Hgb A1c MFr Bld: 7 % — ABNORMAL HIGH (ref 4.6–6.5)

## 2011-10-27 LAB — HEPATIC FUNCTION PANEL
Bilirubin, Direct: 0.1 mg/dL (ref 0.0–0.3)
Total Bilirubin: 0.7 mg/dL (ref 0.3–1.2)
Total Protein: 7.4 g/dL (ref 6.0–8.3)

## 2011-10-27 LAB — POCT URINALYSIS DIPSTICK
Ketones, UA: NEGATIVE
Protein, UA: NEGATIVE
Spec Grav, UA: >=1.03
Urobilinogen, UA: 0.2
pH, UA: 5

## 2011-10-27 LAB — LIPID PANEL
HDL: 45 mg/dL (ref >39.00)
Triglycerides: 149 mg/dL (ref 0.0–149.0)
VLDL: 29.8 mg/dL (ref 0.0–40.0)

## 2011-10-27 LAB — BASIC METABOLIC PANEL
Calcium: 9.2 mg/dL (ref 8.4–10.5)
GFR: 82.3 mL/min (ref >60.00)
Glucose, Bld: 126 mg/dL — ABNORMAL HIGH (ref 70–99)
Potassium: 4 mEq/L (ref 3.5–5.1)
Sodium: 143 mEq/L (ref 135–145)

## 2011-10-27 MED ORDER — LOSARTAN POTASSIUM 100 MG PO TABS: 100.0000 mg | ORAL_TABLET | Freq: Every day | ORAL | Status: DC

## 2011-10-27 NOTE — Progress Notes (Signed)
Patient ID: Linda Golden, female   DOB: 08-06-1927, 78 y.o.   MRN: [id number]  patient comes in for followup of multiple medical problems including type 2 diabetes, hyperlipidemia, hypertension. The patient does not check blood sugar or blood pressure at home. The patetient does not follow an exercise or diet program. The patient denies any polyuria, polydipsia.  In the past the patient has gone to diabetic treatment center. The patient is tolerating medications  Without difficulty. The patient does admit to medication compliance.   She is not monitoring home BPs  Past Medical History  Diagnosis Date  . Diabetes mellitus   . Hyperlipidemia   . Hypertension   . Osteopenia   . Kidney mass   . Atrophy kidney     History   Social History  . Marital Status: Married    Spouse Name: N/A    Number of Children: N/A  . Years of Education: N/A   Occupational History  . Not on file.   Social History Main Topics  . Smoking status: Never Smoker   . Smokeless tobacco: Never Used  . Alcohol Use: No  . Drug Use: No  . Sexually Active: Not on file   Other Topics Concern  . Not on file   Social History Narrative  . No narrative on file    Past Surgical History  Procedure Date  . Foot spur     No family history on file.  Allergies  Allergen Reactions  . Lisinopril     REACTION: cough    Current Outpatient Prescriptions on File Prior to Visit  Medication Sig Dispense Refill  . COZAAR 50 MG tablet TAKE 1 TABLET BY MOUTH   ONCE A DAY.  30 each  5  . simvastatin (ZOCOR) 20 MG tablet Take 1 tablet (20 mg total) by mouth at bedtime.  90 tablet  1     patient denies chest pain, shortness of breath, orthopnea. Denies lower extremity edema, abdominal pain, change in appetite, change in bowel movements. Patient denies rashes, musculoskeletal complaints. No other specific complaints in a complete review of systems.   BP 184/94  Pulse 76  Temp(Src) 98.2 F (36.8 C) (Oral)  Wt 133 lb  (60.328 kg)  Well-developed well-nourished female in no acute distress. HEENT exam atraumatic, normocephalic, extraocular muscles are intact. Neck is supple. No jugular venous distention no thyromegaly. Chest clear to auscultation without increased work of breathing. Cardiac exam S1 and S2 are regular. Abdominal exam active bowel sounds, soft, nontender. Extremities no edema.

## 2011-10-27 NOTE — Assessment & Plan Note (Addendum)
Not controlled BP Readings from Last 3 Encounters:  10/27/11 184/94  06/06/11 120/80  02/27/11 128/78   Will increase losartan and ask her to monitor BP at home

## 2011-10-27 NOTE — Assessment & Plan Note (Signed)
Check labs today Stay on same meds for now

## 2011-10-27 NOTE — Assessment & Plan Note (Signed)
Check labs today.

## 2011-10-31 ENCOUNTER — Other Ambulatory Visit: Payer: Self-pay | Admitting: Internal Medicine

## 2011-11-01 ENCOUNTER — Other Ambulatory Visit: Payer: Self-pay | Admitting: *Deleted

## 2011-11-01 MED ORDER — ATORVASTATIN CALCIUM 40 MG PO TABS: 40.0000 mg | ORAL_TABLET | Freq: Every day | ORAL | Status: DC

## 2011-11-01 NOTE — Progress Notes (Signed)
yes

## 2011-11-01 NOTE — Progress Notes (Addendum)
Addended by: Rita Ohara R on: 11/01/2011 09:39 AM  Modules accepted: Orders   Has uti, Prescribe cipro

## 2011-11-04 LAB — URINE CULTURE

## 2011-11-05 MED ORDER — CIPROFLOXACIN HCL 250 MG PO TABS: 250.0000 mg | ORAL_TABLET | Freq: Two times a day (BID) | ORAL | Status: AC

## 2011-11-05 NOTE — Progress Notes (Signed)
Addended by: Lindley Magnus on: 11/05/2011 07:35 PM   Modules accepted: Orders

## 2011-11-06 ENCOUNTER — Other Ambulatory Visit: Payer: Self-pay | Admitting: *Deleted

## 2012-01-29 ENCOUNTER — Other Ambulatory Visit: Payer: Self-pay | Admitting: Vascular Surgery

## 2012-04-22 ENCOUNTER — Ambulatory Visit (INDEPENDENT_AMBULATORY_CARE_PROVIDER_SITE_OTHER): Payer: PRIVATE HEALTH INSURANCE | Admitting: Internal Medicine

## 2012-04-22 ENCOUNTER — Encounter: Payer: Self-pay | Admitting: Internal Medicine

## 2012-04-22 VITALS — BP 142/74 | HR 76 | Temp 98.4°F | Wt 130.0 lb

## 2012-04-22 DIAGNOSIS — E785 Hyperlipidemia, unspecified: Secondary | ICD-10-CM

## 2012-04-22 DIAGNOSIS — E119 Type 2 diabetes mellitus without complications: Secondary | ICD-10-CM

## 2012-04-22 DIAGNOSIS — I10 Essential (primary) hypertension: Secondary | ICD-10-CM

## 2012-04-22 LAB — BASIC METABOLIC PANEL
BUN: 26 mg/dL — ABNORMAL HIGH (ref 6–23)
CO2: 25 mEq/L (ref 19–32)
Calcium: 9.3 mg/dL (ref 8.4–10.5)
GFR: 69.76 mL/min (ref >60.00)
Glucose, Bld: 146 mg/dL — ABNORMAL HIGH (ref 70–99)
Potassium: 4.1 mEq/L (ref 3.5–5.1)

## 2012-04-22 LAB — HEMOGLOBIN A1C: Hgb A1c MFr Bld: 7.3 % — ABNORMAL HIGH (ref 4.6–6.5)

## 2012-04-22 LAB — HEPATIC FUNCTION PANEL
ALT: 20 U/L (ref 0–35)
AST: 19 U/L (ref 0–37)
Bilirubin, Direct: 0.1 mg/dL (ref 0.0–0.3)
Total Bilirubin: 1 mg/dL (ref 0.3–1.2)

## 2012-04-22 LAB — LIPID PANEL: Cholesterol: 174 mg/dL (ref 0–200)

## 2012-04-22 NOTE — Progress Notes (Signed)
Quick Note:  Pt informed on home VM ______ 

## 2012-04-22 NOTE — Progress Notes (Signed)
  Subjective:    Patient ID: Linda Golden, female    DOB: 02-17-1927, 78 y.o.   MRN: [id number]  HPI   patient comes in for followup of multiple medical problems including type 2 diabetes, hyperlipidemia, hypertension. The patient does not check blood sugar or blood pressure at home. The patetient does not follow an exercise or diet program. The patient denies any polyuria, polydipsia.  In the past the patient has gone to diabetic treatment center. The patient is tolerating medications  Without difficulty. The patient does admit to medication compliance.   Past Medical History  Diagnosis Date  . Diabetes mellitus   . Hyperlipidemia   . Hypertension   . Osteopenia   . Kidney mass   . Atrophy kidney     History   Social History  . Marital Status: Married    Spouse Name: N/A    Number of Children: N/A  . Years of Education: N/A   Occupational History  . Not on file.   Social History Main Topics  . Smoking status: Never Smoker   . Smokeless tobacco: Never Used  . Alcohol Use: No  . Drug Use: No  . Sexually Active: Not on file   Other Topics Concern  . Not on file   Social History Narrative  . No narrative on file    Past Surgical History  Procedure Date  . Foot spur     No family history on file.  Allergies  Allergen Reactions  . Lisinopril     REACTION: cough    Current Outpatient Prescriptions on File Prior to Visit  Medication Sig Dispense Refill  . atorvastatin (LIPITOR) 40 MG tablet Take 1 tablet (40 mg total) by mouth daily.  90 tablet  3  . losartan (COZAAR) 100 MG tablet Take 1 tablet (100 mg total) by mouth daily.  90 tablet  3     patient denies chest pain, shortness of breath, orthopnea. Denies lower extremity edema, abdominal pain, change in appetite, change in bowel movements. Patient denies rashes, musculoskeletal complaints. No other specific complaints in a complete review of systems.   BP 158/92  Pulse 76  Temp 98.4 F (36.9 C) (Oral)  Wt  130 lb (58.968 kg)  Well-developed well-nourished female in no acute distress. HEENT exam atraumatic, normocephalic, extraocular muscles are intact. Neck is supple. No jugular venous distention no thyromegaly. Chest clear to auscultation without increased work of breathing. Cardiac exam S1 and S2 are regular. Abdominal exam active bowel sounds, soft, nontender. Extremities no edema. Neurologic exam she is alert without any motor sensory deficits. Gait is normal.   Review of Systems     Objective:   Physical Exam        Assessment & Plan:

## 2012-04-23 NOTE — Assessment & Plan Note (Signed)
Needs followup laboratory will check on day of office visit.

## 2012-04-23 NOTE — Assessment & Plan Note (Signed)
BP Readings from Last 3 Encounters:  04/22/12 142/74  10/27/11 184/94  06/06/11 120/80   Blood pressure is fairly well controlled. We'll continue current medications. I like her to monitor blood pressure at home. Goal blood pressure less than 135/85.

## 2012-04-23 NOTE — Assessment & Plan Note (Signed)
Check labs today.

## 2012-05-24 ENCOUNTER — Encounter: Payer: Self-pay | Admitting: Family Medicine

## 2012-05-24 ENCOUNTER — Ambulatory Visit (INDEPENDENT_AMBULATORY_CARE_PROVIDER_SITE_OTHER): Payer: PRIVATE HEALTH INSURANCE | Admitting: Family Medicine

## 2012-05-24 VITALS — BP 150/80 | Temp 98.3°F | Wt 130.0 lb

## 2012-05-24 DIAGNOSIS — K5732 Diverticulitis of large intestine without perforation or abscess without bleeding: Secondary | ICD-10-CM

## 2012-05-24 MED ORDER — CIPROFLOXACIN HCL 500 MG PO TABS: 500.0000 mg | ORAL_TABLET | Freq: Two times a day (BID) | ORAL | Status: AC

## 2012-05-24 MED ORDER — METRONIDAZOLE 500 MG PO TABS: 500.0000 mg | ORAL_TABLET | Freq: Two times a day (BID) | ORAL | Status: AC

## 2012-05-24 NOTE — Progress Notes (Signed)
  Subjective:    Patient ID: Linda Golden, female    DOB: 1927/07/08, 78 y.o.   MRN: [id number]  HPI Here for 2 days of LLQ pain and one episode of bright red blood in the stool last night. No fever. She has a hx of diverticulitis, and she thinks this has come back.    Review of Systems  Constitutional: Negative.   Gastrointestinal: Positive for abdominal pain and blood in stool. Negative for nausea, vomiting, diarrhea, constipation, abdominal distention, anal bleeding and rectal pain.       Objective:   Physical Exam  Constitutional: She appears well-developed and well-nourished.  Abdominal: Soft. Bowel sounds are normal. She exhibits no distension and no mass. There is no rebound and no guarding.       Mildly tender in the LLQ          Assessment & Plan:  Recurrent diverticulitis. Treat with Cipro and Flagyl. She knows to go to the ER over the weekend if she gets any worse.

## 2012-07-01 ENCOUNTER — Ambulatory Visit (INDEPENDENT_AMBULATORY_CARE_PROVIDER_SITE_OTHER): Payer: PRIVATE HEALTH INSURANCE | Admitting: Internal Medicine

## 2012-07-01 VITALS — BP 162/84 | Temp 98.7°F | Wt 130.0 lb

## 2012-07-01 DIAGNOSIS — I803 Phlebitis and thrombophlebitis of lower extremities, unspecified: Secondary | ICD-10-CM | POA: Insufficient documentation

## 2012-07-01 MED ORDER — ASPIRIN EC 325 MG PO TBEC: 325.0000 mg | DELAYED_RELEASE_TABLET | Freq: Every day | ORAL | Status: DC

## 2012-07-01 NOTE — Patient Instructions (Addendum)
Use warm compress to left leg 2-3 times per day Patient advised to call office if symptoms persist or worsen.

## 2012-07-01 NOTE — Progress Notes (Signed)
  Subjective:    Patient ID: Linda Golden, female    DOB: September 25, 1927, 78 y.o.   MRN: [id number]  HPI  78 year old white female with history of type 2 diabetes, hypertension and varicose veins complains of left upper leg swelling and pain x1 week. She has focal area below her left knee and medial aspect of her lower extremity that is red swollen and tender. She has a varicosity that is symptomatic.  The rest of her calf is non tender.  Review of Systems Negative for chest pain or shortness of breath.    Past Medical History  Diagnosis Date  . Diabetes mellitus   . Hyperlipidemia   . Hypertension   . Osteopenia   . Kidney mass   . Atrophy kidney     History   Social History  . Marital Status: Married    Spouse Name: N/A    Number of Children: N/A  . Years of Education: N/A   Occupational History  . Not on file.   Social History Main Topics  . Smoking status: Never Smoker   . Smokeless tobacco: Never Used  . Alcohol Use: No  . Drug Use: No  . Sexually Active: Not on file   Other Topics Concern  . Not on file   Social History Narrative  . No narrative on file    Past Surgical History  Procedure Date  . Foot spur     No family history on file.  Allergies  Allergen Reactions  . Lisinopril     REACTION: cough    Current Outpatient Prescriptions on File Prior to Visit  Medication Sig Dispense Refill  . atorvastatin (LIPITOR) 40 MG tablet Take 1 tablet (40 mg total) by mouth daily.  90 tablet  3  . losartan (COZAAR) 100 MG tablet Take 1 tablet (100 mg total) by mouth daily.  90 tablet  3    BP 162/84  Temp 98.7 F (37.1 C) (Oral)  Wt 130 lb (58.968 kg)    Objective:   Physical Exam  Constitutional: She appears well-developed and well-nourished.  Cardiovascular: Normal rate, regular rhythm and normal heart sounds.   Pulmonary/Chest: Effort normal and breath sounds normal. She has no wheezes.  Skin:       Tender varicosity along left upper lower  extremity          Assessment & Plan:

## 2012-07-01 NOTE — Assessment & Plan Note (Signed)
78 year old white female with symptomatic, phlebitis of left leg. Treat with aspirin and warm compresse. Reassess in one week. If persistent symptoms we discussed using low-dose Lovenox.

## 2012-07-08 ENCOUNTER — Encounter: Payer: Self-pay | Admitting: Internal Medicine

## 2012-07-08 ENCOUNTER — Ambulatory Visit (INDEPENDENT_AMBULATORY_CARE_PROVIDER_SITE_OTHER): Payer: PRIVATE HEALTH INSURANCE | Admitting: Internal Medicine

## 2012-07-08 VITALS — BP 152/80 | Temp 98.8°F | Wt 130.0 lb

## 2012-07-08 DIAGNOSIS — I10 Essential (primary) hypertension: Secondary | ICD-10-CM

## 2012-07-08 DIAGNOSIS — I803 Phlebitis and thrombophlebitis of lower extremities, unspecified: Secondary | ICD-10-CM

## 2012-07-08 DIAGNOSIS — Z23 Encounter for immunization: Secondary | ICD-10-CM

## 2012-07-08 MED ORDER — AMLODIPINE BESYLATE 2.5 MG PO TABS: 2.5000 mg | ORAL_TABLET | Freq: Every day | ORAL | Status: DC

## 2012-07-08 NOTE — Assessment & Plan Note (Signed)
Blood pressure is suboptimally controlled. Continue losartan 100 mg once daily. Add amlodipine 2.5 mg once daily. BP: 152/80 mmHg  Lab Results  Component Value Date   CREATININE 0.8 04/22/2012   Reassess in 6 weeks.

## 2012-07-08 NOTE — Assessment & Plan Note (Signed)
Improved with daily aspirin one compresses. She still has firm varicosity left upper calf. Patient advised to continue to use warm compress and aspirin daily for additional 2 weeks. She declines referral to vascular specialist.

## 2012-07-08 NOTE — Patient Instructions (Signed)
Continue to use compression hose and daily aspirin for additional 2-4 weeks.

## 2012-07-08 NOTE — Progress Notes (Signed)
  Subjective:    Patient ID: Linda Golden, female    DOB: 1927-03-13, 78 y.o.   MRN: [id number]  HPI  78 year old white female for follow up regarding superficial thrombophlebitis of left leg. Patient treated with daily aspirin and warm compresses. Patient reports redness and swelling significantly improved. She still has firm area of her custody of left lower leg.  Hypertension-patient reports good medication compliance with losartan 100 mg once daily. She monitors her blood pressure at home and her systolic blood pressure readings are between 140 and 150.  Review of Systems Negative for fever or chills  Past Medical History  Diagnosis Date  . Diabetes mellitus   . Hyperlipidemia   . Hypertension   . Osteopenia   . Kidney mass   . Atrophy kidney     History   Social History  . Marital Status: Married    Spouse Name: N/A    Number of Children: N/A  . Years of Education: N/A   Occupational History  . Not on file.   Social History Main Topics  . Smoking status: Never Smoker   . Smokeless tobacco: Never Used  . Alcohol Use: No  . Drug Use: No  . Sexually Active: Not on file   Other Topics Concern  . Not on file   Social History Narrative  . No narrative on file    Past Surgical History  Procedure Date  . Foot spur     No family history on file.  Allergies  Allergen Reactions  . Lisinopril     REACTION: cough    Current Outpatient Prescriptions on File Prior to Visit  Medication Sig Dispense Refill  . aspirin EC 325 MG tablet Take 1 tablet (325 mg total) by mouth daily.  30 tablet  0  . atorvastatin (LIPITOR) 40 MG tablet Take 1 tablet (40 mg total) by mouth daily.  90 tablet  3  . losartan (COZAAR) 100 MG tablet Take 1 tablet (100 mg total) by mouth daily.  90 tablet  3  . amLODipine (NORVASC) 2.5 MG tablet Take 1 tablet (2.5 mg total) by mouth daily.  30 tablet  3    BP 152/80  Temp 98.8 F (37.1 C) (Oral)  Wt 130 lb (58.968 kg)         Objective:   Physical Exam  Constitutional: She appears well-developed and well-nourished.  Cardiovascular: Normal rate, regular rhythm and normal heart sounds.   Pulmonary/Chest: Effort normal and breath sounds normal. She has no wheezes.  Skin:       Persistent firm area/varicosity of left upper calf. No tenderness, no redness          Assessment & Plan:

## 2012-08-21 ENCOUNTER — Encounter: Payer: Self-pay | Admitting: Internal Medicine

## 2012-08-21 ENCOUNTER — Ambulatory Visit (INDEPENDENT_AMBULATORY_CARE_PROVIDER_SITE_OTHER): Payer: PRIVATE HEALTH INSURANCE | Admitting: Internal Medicine

## 2012-08-21 VITALS — BP 155/75 | HR 68 | Temp 98.1°F | Wt 131.0 lb

## 2012-08-21 DIAGNOSIS — R350 Frequency of micturition: Secondary | ICD-10-CM

## 2012-08-21 DIAGNOSIS — I10 Essential (primary) hypertension: Secondary | ICD-10-CM

## 2012-08-21 DIAGNOSIS — E119 Type 2 diabetes mellitus without complications: Secondary | ICD-10-CM

## 2012-08-21 DIAGNOSIS — E785 Hyperlipidemia, unspecified: Secondary | ICD-10-CM

## 2012-08-21 LAB — POCT URINALYSIS DIPSTICK
Bilirubin, UA: NEGATIVE
Protein, UA: NEGATIVE
Spec Grav, UA: 1.025

## 2012-08-21 NOTE — Assessment & Plan Note (Signed)
Previously controlled Continue same meds 

## 2012-08-21 NOTE — Addendum Note (Signed)
Addended by: Lindley Magnus on: 08/21/2012 09:23 AM   Modules accepted: Orders

## 2012-08-21 NOTE — Assessment & Plan Note (Signed)
Home bps are fair She states that amlodipine caused urinary frequency. Unlikely I'll check ua to r/o infection Based on that i might resume amlodipine

## 2012-08-21 NOTE — Progress Notes (Signed)
Patient ID: CLAUDIO GAILLARD, female   DOB: Apr 06, 1927, 78 y.o.   MRN: [id number] Superficial thrombophlebitis-- much improved  Dm-- CBG average in 120s  BPs 140s/60s  Past Medical History  Diagnosis Date  . Diabetes mellitus   . Hyperlipidemia   . Hypertension   . Osteopenia   . Kidney mass   . Atrophy kidney     History   Social History  . Marital Status: Married    Spouse Name: N/A    Number of Children: N/A  . Years of Education: N/A   Occupational History  . Not on file.   Social History Main Topics  . Smoking status: Never Smoker   . Smokeless tobacco: Never Used  . Alcohol Use: No  . Drug Use: No  . Sexually Active: Not on file   Other Topics Concern  . Not on file   Social History Narrative  . No narrative on file    Past Surgical History  Procedure Date  . Foot spur     No family history on file.  Allergies  Allergen Reactions  . Lisinopril     REACTION: cough    Current Outpatient Prescriptions on File Prior to Visit  Medication Sig Dispense Refill  . atorvastatin (LIPITOR) 40 MG tablet Take 1 tablet (40 mg total) by mouth daily.  90 tablet  3  . losartan (COZAAR) 100 MG tablet Take 1 tablet (100 mg total) by mouth daily.  90 tablet  3  . amLODipine (NORVASC) 2.5 MG tablet Take 1 tablet (2.5 mg total) by mouth daily.  30 tablet  3     patient denies chest pain, shortness of breath, orthopnea. Denies lower extremity edema, abdominal pain, change in appetite, change in bowel movements. Patient denies rashes, musculoskeletal complaints. No other specific complaints in a complete review of systems.   BP 155/75  Pulse 68  Temp 98.1 F (36.7 C) (Oral)  Wt 131 lb (59.421 kg)  Well-developed well-nourished female in no acute distress. HEENT exam atraumatic, normocephalic, extraocular muscles are intact. Neck is supple. No jugular venous distention no thyromegaly. Chest clear to auscultation without increased work of breathing. Cardiac exam S1 and S2  are regular. Abdominal exam active bowel sounds, soft, nontender. Extremities no edema. Neurologic exam she is alert without any motor sensory deficits. Gait is normal.

## 2012-08-21 NOTE — Assessment & Plan Note (Signed)
Will check labs today

## 2012-08-23 LAB — URINE CULTURE: Colony Count: >=100000

## 2012-08-26 ENCOUNTER — Other Ambulatory Visit: Payer: Self-pay | Admitting: *Deleted

## 2012-08-26 MED ORDER — CIPROFLOXACIN HCL 250 MG PO TABS: 250.0000 mg | ORAL_TABLET | Freq: Two times a day (BID) | ORAL | Status: DC

## 2012-10-28 ENCOUNTER — Other Ambulatory Visit: Payer: Self-pay | Admitting: Internal Medicine

## 2012-10-29 ENCOUNTER — Telehealth: Payer: Self-pay | Admitting: *Deleted

## 2012-10-29 NOTE — Telephone Encounter (Signed)
Pt was seen by Dr Artist Pais for phlebitis of her leg and he told her to take asp 325mg .  She wanted to know if she should still take it or go on a lower dose or stay the same.  Leg is better.  Per Dr Cato Mulligan lower dose to 81 mg.  Pt verbalized understanding and had no questions.  Med list updated

## 2012-12-10 ENCOUNTER — Other Ambulatory Visit: Payer: Self-pay | Admitting: *Deleted

## 2012-12-10 MED ORDER — ATORVASTATIN CALCIUM 40 MG PO TABS: 40.0000 mg | ORAL_TABLET | Freq: Every day | ORAL | Status: DC

## 2013-04-08 ENCOUNTER — Ambulatory Visit (INDEPENDENT_AMBULATORY_CARE_PROVIDER_SITE_OTHER): Payer: Medicare Other | Admitting: Internal Medicine

## 2013-04-08 ENCOUNTER — Encounter: Payer: Self-pay | Admitting: Internal Medicine

## 2013-04-08 VITALS — BP 136/75 | HR 64 | Temp 98.0°F | Wt 132.0 lb

## 2013-04-08 DIAGNOSIS — E1169 Type 2 diabetes mellitus with other specified complication: Secondary | ICD-10-CM

## 2013-04-08 DIAGNOSIS — E119 Type 2 diabetes mellitus without complications: Secondary | ICD-10-CM

## 2013-04-08 DIAGNOSIS — E785 Hyperlipidemia, unspecified: Secondary | ICD-10-CM

## 2013-04-08 LAB — HEMOGLOBIN A1C: Hgb A1c MFr Bld: 7.3 % — ABNORMAL HIGH (ref 4.6–6.5)

## 2013-04-08 LAB — HEPATIC FUNCTION PANEL
ALT: 28 U/L (ref 0–35)
AST: 25 U/L (ref 0–37)
Albumin: 4.3 g/dL (ref 3.5–5.2)
Alkaline Phosphatase: 67 U/L (ref 39–117)
Bilirubin, Direct: 0.1 mg/dL (ref 0.0–0.3)
Total Protein: 7.8 g/dL (ref 6.0–8.3)

## 2013-04-08 LAB — BASIC METABOLIC PANEL
BUN: 22 mg/dL (ref 6–23)
Creatinine, Ser: 0.8 mg/dL (ref 0.4–1.2)
GFR: 72.62 mL/min (ref >60.00)

## 2013-04-08 LAB — LIPID PANEL
Cholesterol: 210 mg/dL — ABNORMAL HIGH (ref 0–200)
Triglycerides: 227 mg/dL — ABNORMAL HIGH (ref 0.0–149.0)

## 2013-04-08 NOTE — Progress Notes (Signed)
Patient ID: Linda Golden, female   DOB: 1927/05/02, 79 y.o.   MRN: [id number]  patient comes in for followup of multiple medical problems including type 2 diabetes, hyperlipidemia, hypertension. The patient does not check blood sugar or blood pressure at home. The patetient does follow an exercise and diet program. The patient denies any polyuria, polydipsia.  In the past the patient has gone to diabetic treatment center. The patient is tolerating medications  Without difficulty. The patient does admit to medication compliance.   She has complaints of hair loss- convinced that lipitor is the culprit  Renal mass- has f/u with Dr. Annabell Howells-  Past Medical History  Diagnosis Date  . Diabetes mellitus   . Hyperlipidemia   . Hypertension   . Osteopenia   . Kidney mass   . Atrophy kidney     History   Social History  . Marital Status: Married    Spouse Name: N/A    Number of Children: N/A  . Years of Education: N/A   Occupational History  . Not on file.   Social History Main Topics  . Smoking status: Never Smoker   . Smokeless tobacco: Never Used  . Alcohol Use: No  . Drug Use: No  . Sexually Active: Not on file   Other Topics Concern  . Not on file   Social History Narrative  . No narrative on file    Past Surgical History  Procedure Laterality Date  . Foot spur      No family history on file.  Allergies  Allergen Reactions  . Lisinopril     REACTION: cough    Current Outpatient Prescriptions on File Prior to Visit  Medication Sig Dispense Refill  . aspirin 81 MG tablet Take 81 mg by mouth daily.      Marland Kitchen atorvastatin (LIPITOR) 40 MG tablet Take 1 tablet (40 mg total) by mouth daily.  90 tablet  3  . losartan (COZAAR) 100 MG tablet TAKE 1 TABLET BY MOUTH ONCE A DAY.  90 tablet  1   No current facility-administered medications on file prior to visit.     patient denies chest pain, shortness of breath, orthopnea. Denies lower extremity edema, abdominal pain, change in  appetite, change in bowel movements. Patient denies rashes, musculoskeletal complaints. No other specific complaints in a complete review of systems.   BP 180/92  Pulse 64  Temp(Src) 98 F (36.7 C) (Oral)  Wt 132 lb (59.875 kg)  BMI 23.39 kg/m2   Well-developed well-nourished female in no acute distress. HEENT exam atraumatic, normocephalic, extraocular muscles are intact. Neck is supple. No jugular venous distention no thyromegaly. Chest clear to auscultation without increased work of breathing. Cardiac exam S1 and S2 are regular. Abdominal exam active bowel sounds, soft, nontender. Extremities no edema. Neurologic exam she is alert without any motor sensory deficits. Gait is normal.

## 2013-04-10 NOTE — Assessment & Plan Note (Signed)
Check labs contiue meds and risk factor modification

## 2013-04-10 NOTE — Assessment & Plan Note (Signed)
She has stopped meds-- ? Hair loss Will follow

## 2013-04-24 ENCOUNTER — Other Ambulatory Visit: Payer: Self-pay

## 2013-05-13 ENCOUNTER — Other Ambulatory Visit: Payer: Self-pay | Admitting: *Deleted

## 2013-05-13 MED ORDER — LOSARTAN POTASSIUM 100 MG PO TABS: ORAL_TABLET | ORAL | Status: DC

## 2013-08-01 ENCOUNTER — Ambulatory Visit (INDEPENDENT_AMBULATORY_CARE_PROVIDER_SITE_OTHER): Payer: Medicare Other

## 2013-08-01 DIAGNOSIS — Z23 Encounter for immunization: Secondary | ICD-10-CM

## 2013-09-27 ENCOUNTER — Ambulatory Visit (INDEPENDENT_AMBULATORY_CARE_PROVIDER_SITE_OTHER): Payer: Medicare Other | Admitting: Internal Medicine

## 2013-09-27 ENCOUNTER — Encounter: Payer: Self-pay | Admitting: Internal Medicine

## 2013-09-27 VITALS — BP 160/80 | HR 82 | Temp 97.0°F | Resp 20 | Wt 131.0 lb

## 2013-09-27 DIAGNOSIS — I7300 Raynaud's syndrome without gangrene: Secondary | ICD-10-CM

## 2013-09-27 NOTE — Progress Notes (Signed)
Pre-visit discussion using our clinic review tool. No additional management support is needed unless otherwise documented below in the visit note.  

## 2013-09-27 NOTE — Progress Notes (Signed)
Pt well known to me She describes 2 separate instances over the past 72 hours. 1) right index finger turned white and felt "numb". Resolved with warm water 2) left 3rd finger looked dark and then turned white- resolved with warm water  Reviewed pmh, psh, sochx Reviewed meds  Ros- no other complaints She has remained her acitve self- raking leaves etc..  Exam BP 160/80  Pulse 82  Temp(Src) 97 F (36.1 C) (Oral)  Resp 20  Wt 131 lb (59.421 kg)  SpO2 97% nad Chest CTA cv Reg rate extr No CCE Alle test- equivocal when ulnar artery released (slow refill), radial release provides prompt refill. This is bilateral.  A/p- i suspect raynaud's  This is not embolic as sxs resolved quickly with warm water I have an appt with her in 10 days

## 2013-10-06 ENCOUNTER — Ambulatory Visit: Payer: Medicare Other | Admitting: Internal Medicine

## 2013-10-06 ENCOUNTER — Ambulatory Visit (INDEPENDENT_AMBULATORY_CARE_PROVIDER_SITE_OTHER): Payer: Medicare Other | Admitting: Internal Medicine

## 2013-10-06 ENCOUNTER — Encounter: Payer: Self-pay | Admitting: Internal Medicine

## 2013-10-06 VITALS — BP 140/88 | HR 80 | Temp 97.9°F | Ht 63.0 in | Wt 131.0 lb

## 2013-10-06 DIAGNOSIS — E119 Type 2 diabetes mellitus without complications: Secondary | ICD-10-CM

## 2013-10-06 DIAGNOSIS — I10 Essential (primary) hypertension: Secondary | ICD-10-CM

## 2013-10-06 DIAGNOSIS — E785 Hyperlipidemia, unspecified: Secondary | ICD-10-CM

## 2013-10-06 DIAGNOSIS — I7300 Raynaud's syndrome without gangrene: Secondary | ICD-10-CM

## 2013-10-06 LAB — HEPATIC FUNCTION PANEL
ALT: 29 U/L (ref 0–35)
Albumin: 4.3 g/dL (ref 3.5–5.2)
Alkaline Phosphatase: 68 U/L (ref 39–117)
Bilirubin, Direct: 0.1 mg/dL (ref 0.0–0.3)
Total Protein: 7.6 g/dL (ref 6.0–8.3)

## 2013-10-06 LAB — MICROALBUMIN / CREATININE URINE RATIO
Creatinine,U: 123.6 mg/dL
Microalb Creat Ratio: 1.5 mg/g (ref 0.0–30.0)
Microalb, Ur: 1.8 mg/dL (ref 0.0–1.9)

## 2013-10-06 LAB — HM DIABETES FOOT EXAM

## 2013-10-06 NOTE — Progress Notes (Signed)
Pre visit review using our clinic review tool, if applicable. No additional management support is needed unless otherwise documented below in the visit note. 

## 2013-10-07 LAB — BASIC METABOLIC PANEL
CO2: 28 mEq/L (ref 19–32)
Chloride: 105 mEq/L (ref 96–112)
Glucose, Bld: 116 mg/dL — ABNORMAL HIGH (ref 70–99)
Sodium: 140 mEq/L (ref 135–145)

## 2013-10-07 LAB — LDL CHOLESTEROL, DIRECT: Direct LDL: 189.9 mg/dL

## 2013-10-07 LAB — LIPID PANEL: Total CHOL/HDL Ratio: 6

## 2013-10-08 DIAGNOSIS — I7300 Raynaud's syndrome without gangrene: Secondary | ICD-10-CM | POA: Insufficient documentation

## 2013-10-08 NOTE — Assessment & Plan Note (Signed)
Will check laboratory work today. She remains quite active. She is currently on no medications for diabetes.

## 2013-10-08 NOTE — Assessment & Plan Note (Signed)
She is intolerant to statins.

## 2013-10-08 NOTE — Assessment & Plan Note (Signed)
BP Readings from Last 3 Encounters:  10/06/13 140/88  09/27/13 160/80  04/08/13 136/75   Fair control. Continue current medications.

## 2013-10-13 ENCOUNTER — Ambulatory Visit: Payer: Medicare Other | Admitting: Internal Medicine

## 2014-01-29 DIAGNOSIS — L570 Actinic keratosis: Secondary | ICD-10-CM | POA: Diagnosis not present

## 2014-01-29 DIAGNOSIS — D235 Other benign neoplasm of skin of trunk: Secondary | ICD-10-CM | POA: Diagnosis not present

## 2014-01-29 DIAGNOSIS — Z85828 Personal history of other malignant neoplasm of skin: Secondary | ICD-10-CM | POA: Diagnosis not present

## 2014-01-29 DIAGNOSIS — L659 Nonscarring hair loss, unspecified: Secondary | ICD-10-CM | POA: Diagnosis not present

## 2014-02-19 DIAGNOSIS — E1149 Type 2 diabetes mellitus with other diabetic neurological complication: Secondary | ICD-10-CM | POA: Diagnosis not present

## 2014-02-19 DIAGNOSIS — E119 Type 2 diabetes mellitus without complications: Secondary | ICD-10-CM | POA: Diagnosis not present

## 2014-03-02 DIAGNOSIS — H2510 Age-related nuclear cataract, unspecified eye: Secondary | ICD-10-CM | POA: Diagnosis not present

## 2014-03-02 DIAGNOSIS — I10 Essential (primary) hypertension: Secondary | ICD-10-CM | POA: Diagnosis not present

## 2014-03-02 DIAGNOSIS — H25019 Cortical age-related cataract, unspecified eye: Secondary | ICD-10-CM | POA: Diagnosis not present

## 2014-03-02 DIAGNOSIS — H18419 Arcus senilis, unspecified eye: Secondary | ICD-10-CM | POA: Diagnosis not present

## 2014-03-04 ENCOUNTER — Ambulatory Visit (INDEPENDENT_AMBULATORY_CARE_PROVIDER_SITE_OTHER): Payer: Medicare Other | Admitting: Physician Assistant

## 2014-03-04 ENCOUNTER — Telehealth: Payer: Self-pay

## 2014-03-04 ENCOUNTER — Encounter: Payer: Self-pay | Admitting: Physician Assistant

## 2014-03-04 VITALS — BP 130/78 | HR 76 | Temp 97.9°F | Resp 20 | Wt 130.0 lb

## 2014-03-04 DIAGNOSIS — J309 Allergic rhinitis, unspecified: Secondary | ICD-10-CM | POA: Diagnosis not present

## 2014-03-04 NOTE — Patient Instructions (Signed)
Plain Over the Counter Mucinex (NOT Mucinex D) for thick secretions  Force NON dairy fluids, drinking plenty of water is best.    Over the Counter Flonase OR Nasacort AQ 1 spray in each nostril twice a day as needed. Use the "crossover" technique into opposite nostril spraying toward opposite ear @ 45 degree angle, not straight up into nostril.   Plain Over the Counter Allegra (NOT D )  160 daily , OR Loratidine 10 mg , OR Zyrtec 10 mg @ bedtime  as needed for itchy eyes & sneezing.  Saline Irrigation and Saline Sprays can also help reduce symptoms.  Follow up in 1 week to reassess, or sooner if symptoms fail to improve despite conservative treatment.   Allergic Rhinitis Allergic rhinitis is when the mucous membranes in the nose respond to allergens. Allergens are particles in the air that cause your body to have an allergic reaction. This causes you to release allergic antibodies. Through a chain of events, these eventually cause you to release histamine into the blood stream. Although meant to protect the body, it is this release of histamine that causes your discomfort, such as frequent sneezing, congestion, and an itchy, runny nose.  CAUSES  Seasonal allergic rhinitis (hay fever) is caused by pollen allergens that may come from grasses, trees, and weeds. Year-round allergic rhinitis (perennial allergic rhinitis) is caused by allergens such as house dust mites, pet dander, and mold spores.  SYMPTOMS   Nasal stuffiness (congestion).  Itchy, runny nose with sneezing and tearing of the eyes. DIAGNOSIS  Your health care provider can help you determine the allergen or allergens that trigger your symptoms. If you and your health care provider are unable to determine the allergen, skin or blood testing may be used. TREATMENT  Allergic Rhinitis does not have a cure, but it can be controlled by:  Medicines and allergy shots (immunotherapy).  Avoiding the allergen. Hay fever may often be  treated with antihistamines in pill or nasal spray forms. Antihistamines block the effects of histamine. There are over-the-counter medicines that may help with nasal congestion and swelling around the eyes. Check with your health care provider before taking or giving this medicine.  If avoiding the allergen or the medicine prescribed do not work, there are many new medicines your health care provider can prescribe. Stronger medicine may be used if initial measures are ineffective. Desensitizing injections can be used if medicine and avoidance does not work. Desensitization is when a patient is given ongoing shots until the body becomes less sensitive to the allergen. Make sure you follow up with your health care provider if problems continue. HOME CARE INSTRUCTIONS It is not possible to completely avoid allergens, but you can reduce your symptoms by taking steps to limit your exposure to them. It helps to know exactly what you are allergic to so that you can avoid your specific triggers. SEEK MEDICAL CARE IF:   You have a fever.  You develop a cough that does not stop easily (persistent).  You have shortness of breath.  You start wheezing.  Symptoms interfere with normal daily activities. Document Released: 06/27/2001 Document Revised: 07/23/2013 Document Reviewed: 06/09/2013 [facility] Patient Information 2014 [employer].

## 2014-03-04 NOTE — Telephone Encounter (Signed)
Pt called and she was working in the yard on Thursday and Friday.  Pt has been sick since Saturday.  Pt is exercising hoarseness, coughing up green mucus,. Pt denies fever, body aches, chills.  Pt does not have an appetite. Made pt an appt with Linda Golden 03/04/14 at 2:30 pm.  Pt is aware.

## 2014-03-04 NOTE — Progress Notes (Signed)
Subjective:    Patient ID: Linda Golden, female    DOB: 05/13/1927, 80 y.o.   MRN: [id number]  HPI Patient is an 80 year old Caucasian female presenting to the clinic for cough. Patient states that last week she was working in her yard and felt like the pollen started to bother her nose and throat. She had a lot of nasal congestion, postnasal drip, sore throat, cough with sputum, and hoarseness. She has used throat lozenges and salt water gargles to relieve her sore throat, which offered moderate relief. She has not tried anything else. She has no history of COPD, no history of asthma, and no documented history of environmental allergies, though she admits pollen has bothered her in the past. She denies fevers, chills, nausea, vomiting, diarrhea, and shortness of breath.    Review of Systems As per the history of present illness and otherwise negative.  Past Medical History  Diagnosis Date  . Diabetes mellitus   . Hyperlipidemia   . Hypertension   . Osteopenia   . Kidney mass   . Atrophy kidney    Past Surgical History  Procedure Laterality Date  . Foot spur      reports that she has never smoked. She has never used smokeless tobacco. She reports that she does not drink alcohol or use illicit drugs. family history is not on file. Allergies  Allergen Reactions  . Lisinopril     REACTION: cough  . Statins     Hair loss       Objective:   Physical Exam  Nursing note and vitals reviewed. Constitutional: She is oriented to person, place, and time. She appears well-developed and well-nourished. No distress.  HENT:  Head: Normocephalic and atraumatic.  Right Ear: External ear normal.  Left Ear: External ear normal.  Mouth/Throat: No oropharyngeal exudate.  Oropharynx is slightly erythematous with cobblestoning, no exudate.  Bilateral tympanic membranes are normal.  Bilateral frontal and maxillary sinuses are nontender to palpation.  Nasal mucosa is boggy.  Eyes:  Conjunctivae and EOM are normal. Pupils are equal, round, and reactive to light. Right eye exhibits no discharge. Left eye exhibits no discharge.  Neck: Normal range of motion. Neck supple. No JVD present.  Cardiovascular: Normal rate, regular rhythm, normal heart sounds and intact distal pulses.  Exam reveals no gallop and no friction rub.   No murmur heard. Pulmonary/Chest: Effort normal and breath sounds normal. No stridor. No respiratory distress. She has no wheezes. She has no rales. She exhibits no tenderness.  Musculoskeletal: Normal range of motion.  Lymphadenopathy:    She has no cervical adenopathy.  Neurological: She is alert and oriented to person, place, and time.  Skin: Skin is warm and dry. No rash noted. She is not diaphoretic. No erythema. No pallor.  Psychiatric: She has a normal mood and affect. Her behavior is normal. Judgment and thought content normal.    Filed Vitals:   03/04/14 1438  BP: 130/78  Pulse: 76  Temp: 97.9 F (36.6 C)  Resp: 20    Lab Results  Component Value Date   WBC 7.2 01/19/2009   HGB 13.1 01/19/2009   HCT 38.6 01/19/2009   PLT 204.0 01/19/2009   GLUCOSE 116* 10/06/2013   CHOL 249* 10/06/2013   TRIG 196.0* 10/06/2013   HDL 38.60* 10/06/2013   LDLDIRECT 189.9 10/06/2013   LDLCALC 98 04/22/2012   ALT 29 10/06/2013   AST 27 10/06/2013   NA 140 10/06/2013   K 5.0 10/06/2013  CL 105 10/06/2013   CREATININE 0.7 10/06/2013   BUN 20 10/06/2013   CO2 28 10/06/2013   TSH 2.85 04/08/2013   HGBA1C 6.9* 10/06/2013   MICROALBUR 1.8 10/06/2013        Assessment & Plan:  Linda Golden was seen today for cough.  Diagnoses and associated orders for this visit:  Allergic rhinitis   Patient provided information for over-the-counter medications that may relieve symptoms from allergic rhinitis, as per the AVS below.  Plan to follow up in one week to reassess. Patient states that she will not return in one week if she is feeling better.   Patient  Instructions  Plain Over the Counter Mucinex (NOT Mucinex D) for thick secretions  Force NON dairy fluids, drinking plenty of water is best.    Over the Counter Flonase OR Nasacort AQ 1 spray in each nostril twice a day as needed. Use the "crossover" technique into opposite nostril spraying toward opposite ear @ 45 degree angle, not straight up into nostril.   Plain Over the Counter Allegra (NOT D )  160 daily , OR Loratidine 10 mg , OR Zyrtec 10 mg @ bedtime  as needed for itchy eyes & sneezing.  Saline Irrigation and Saline Sprays can also help reduce symptoms.  Follow up in 1 week to reassess, or sooner if symptoms fail to improve despite conservative treatment.

## 2014-03-04 NOTE — Progress Notes (Signed)
Pre visit review using our clinic review tool, if applicable. No additional management support is needed unless otherwise documented below in the visit note. 

## 2014-03-11 ENCOUNTER — Ambulatory Visit: Payer: Medicare Other | Admitting: Physician Assistant

## 2014-03-16 LAB — HM DIABETES EYE EXAM

## 2014-04-08 ENCOUNTER — Ambulatory Visit (INDEPENDENT_AMBULATORY_CARE_PROVIDER_SITE_OTHER): Payer: Medicare Other | Admitting: Internal Medicine

## 2014-04-08 ENCOUNTER — Encounter: Payer: Self-pay | Admitting: Internal Medicine

## 2014-04-08 VITALS — BP 134/76 | HR 68 | Temp 98.2°F | Ht 63.0 in | Wt 131.0 lb

## 2014-04-08 DIAGNOSIS — I10 Essential (primary) hypertension: Secondary | ICD-10-CM

## 2014-04-08 DIAGNOSIS — E785 Hyperlipidemia, unspecified: Secondary | ICD-10-CM

## 2014-04-08 DIAGNOSIS — E119 Type 2 diabetes mellitus without complications: Secondary | ICD-10-CM | POA: Diagnosis not present

## 2014-04-08 LAB — MICROALBUMIN / CREATININE URINE RATIO
CREATININE, U: 171.1 mg/dL
MICROALB UR: 2.7 mg/dL — AB (ref 0.0–1.9)
MICROALB/CREAT RATIO: 1.6 mg/g (ref 0.0–30.0)

## 2014-04-08 LAB — BASIC METABOLIC PANEL
BUN: 21 mg/dL (ref 6–23)
CALCIUM: 9.3 mg/dL (ref 8.4–10.5)
CO2: 27 meq/L (ref 19–32)
Chloride: 109 mEq/L (ref 96–112)
Creatinine, Ser: 0.8 mg/dL (ref 0.4–1.2)
GFR: 76.86 mL/min (ref >60.00)
Glucose, Bld: 146 mg/dL — ABNORMAL HIGH (ref 70–99)
Potassium: 4.8 mEq/L (ref 3.5–5.1)
SODIUM: 141 meq/L (ref 135–145)

## 2014-04-08 LAB — HEMOGLOBIN A1C: HEMOGLOBIN A1C: 7.2 % — AB (ref 4.6–6.5)

## 2014-04-08 LAB — HM DIABETES FOOT EXAM

## 2014-04-08 NOTE — Progress Notes (Signed)
Pre visit review using our clinic review tool, if applicable. No additional management support is needed unless otherwise documented below in the visit note. 

## 2014-04-08 NOTE — Progress Notes (Signed)
DM-- rare home CBG No home bps   Cataract- has scheduled removal  htn- tolerating meds  Past Medical History  Diagnosis Date  . Diabetes mellitus   . Hyperlipidemia   . Hypertension   . Osteopenia   . Kidney mass   . Atrophy kidney     History   Social History  . Marital Status: Married    Spouse Name: N/A    Number of Children: N/A  . Years of Education: N/A   Occupational History  . Not on file.   Social History Main Topics  . Smoking status: Never Smoker   . Smokeless tobacco: Never Used  . Alcohol Use: No  . Drug Use: No  . Sexual Activity: Not on file   Other Topics Concern  . Not on file   Social History Narrative  . No narrative on file    Past Surgical History  Procedure Laterality Date  . Foot spur      No family history on file.  Allergies  Allergen Reactions  . Lisinopril     REACTION: cough  . Statins     Hair loss    Current Outpatient Prescriptions on File Prior to Visit  Medication Sig Dispense Refill  . aspirin 81 MG tablet Take 81 mg by mouth daily.      Marland Kitchen losartan (COZAAR) 100 MG tablet TAKE 1 TABLET BY MOUTH ONCE A DAY.  90 tablet  3   No current facility-administered medications on file prior to visit.     patient denies chest pain, shortness of breath, orthopnea. Denies lower extremity edema, abdominal pain, change in appetite, change in bowel movements. Patient denies rashes, musculoskeletal complaints. No other specific complaints in a complete review of systems.   BP 134/76  Pulse 68  Temp(Src) 98.2 F (36.8 C) (Oral)  Ht 5\' 3"  (1.6 m)  Wt 131 lb (59.421 kg)  BMI 23.21 kg/m2  Well-developed well-nourished female in no acute distress. HEENT exam atraumatic, normocephalic, extraocular muscles are intact. Neck is supple. No jugular venous distention no thyromegaly. Chest clear to auscultation without increased work of breathing. Cardiac exam S1 and S2 are regular. Abdominal exam active bowel sounds, soft, nontender.  Extremities no edema. Neurologic exam she is alert without any motor sensory deficits. Gait is normal.  DIABETES MELLITUS, TYPE II She needs followup laboratory work today.  HYPERLIPIDEMIA She is unable to tolerate statins.  HYPERTENSION BP: 134/76 mmHg  Adequate control. Continue current medications.

## 2014-04-10 NOTE — Assessment & Plan Note (Signed)
She needs followup laboratory work today.

## 2014-04-10 NOTE — Assessment & Plan Note (Signed)
She is unable to tolerate statins.

## 2014-04-10 NOTE — Assessment & Plan Note (Signed)
BP: 134/76 mmHg  Adequate control. Continue current medications.

## 2014-04-14 DIAGNOSIS — H2589 Other age-related cataract: Secondary | ICD-10-CM | POA: Diagnosis not present

## 2014-04-14 DIAGNOSIS — H2510 Age-related nuclear cataract, unspecified eye: Secondary | ICD-10-CM | POA: Diagnosis not present

## 2014-04-14 DIAGNOSIS — H269 Unspecified cataract: Secondary | ICD-10-CM | POA: Diagnosis not present

## 2014-05-07 DIAGNOSIS — E119 Type 2 diabetes mellitus without complications: Secondary | ICD-10-CM | POA: Diagnosis not present

## 2014-05-07 DIAGNOSIS — E1149 Type 2 diabetes mellitus with other diabetic neurological complication: Secondary | ICD-10-CM | POA: Diagnosis not present

## 2014-05-12 ENCOUNTER — Other Ambulatory Visit: Payer: Self-pay | Admitting: Internal Medicine

## 2014-05-16 DIAGNOSIS — H16149 Punctate keratitis, unspecified eye: Secondary | ICD-10-CM | POA: Diagnosis not present

## 2014-05-16 DIAGNOSIS — Z961 Presence of intraocular lens: Secondary | ICD-10-CM | POA: Diagnosis not present

## 2014-05-16 DIAGNOSIS — H2510 Age-related nuclear cataract, unspecified eye: Secondary | ICD-10-CM | POA: Diagnosis not present

## 2014-05-16 DIAGNOSIS — H18239 Secondary corneal edema, unspecified eye: Secondary | ICD-10-CM | POA: Diagnosis not present

## 2014-06-26 ENCOUNTER — Encounter: Payer: Self-pay | Admitting: Family Medicine

## 2014-06-26 ENCOUNTER — Ambulatory Visit (INDEPENDENT_AMBULATORY_CARE_PROVIDER_SITE_OTHER): Payer: Medicare Other | Admitting: Family Medicine

## 2014-06-26 VITALS — BP 176/77 | HR 69 | Temp 98.7°F | Ht 63.0 in | Wt 130.0 lb

## 2014-06-26 DIAGNOSIS — K5900 Constipation, unspecified: Secondary | ICD-10-CM | POA: Diagnosis not present

## 2014-06-26 NOTE — Progress Notes (Signed)
   Subjective:    Patient ID: Linda Golden, female    DOB: [birth year], 80 y.o.   MRN: [id number]  HPI Here for constipation. Normally she has regular BMs and averages one BM daily. Then about a week ago she started to feel bloated and felt like her stool was backing up. She has passed only  2 very small stools in the past week. Some mild bilateral lower abdominal cramping, but this feels very different to her than the diverticulitis pains that she has had in the past. No fever or nausea. No blood in the stools. No urinary issues. She drinks lots of water and eats prunes.    Review of Systems  Constitutional: Negative.   Gastrointestinal: Positive for abdominal pain, constipation and abdominal distention. Negative for nausea, vomiting, diarrhea, blood in stool, anal bleeding and rectal pain.  Genitourinary: Negative.        Objective:   Physical Exam  Constitutional: She appears well-developed and well-nourished. No distress.  Cardiovascular: Normal rate, regular rhythm, normal heart sounds and intact distal pulses.   Pulmonary/Chest: Effort normal and breath sounds normal.  Abdominal: Soft. Bowel sounds are normal. She exhibits no distension and no mass. There is no rebound and no guarding.  Mildly tender in both lower quadrants           Assessment & Plan:  Constipation. Try taking Milk of Magnesia one tbsp QID and Miralax 17 gms BID for a few days. Recheck prn

## 2014-06-26 NOTE — Progress Notes (Signed)
Pre visit review using our clinic review tool, if applicable. No additional management support is needed unless otherwise documented below in the visit note. 

## 2014-08-05 ENCOUNTER — Ambulatory Visit (INDEPENDENT_AMBULATORY_CARE_PROVIDER_SITE_OTHER): Payer: Medicare Other

## 2014-08-05 DIAGNOSIS — Z23 Encounter for immunization: Secondary | ICD-10-CM | POA: Diagnosis not present

## 2014-09-01 DIAGNOSIS — L851 Acquired keratosis [keratoderma] palmaris et plantaris: Secondary | ICD-10-CM | POA: Diagnosis not present

## 2014-09-01 DIAGNOSIS — E1342 Other specified diabetes mellitus with diabetic polyneuropathy: Secondary | ICD-10-CM | POA: Diagnosis not present

## 2014-09-01 DIAGNOSIS — B351 Tinea unguium: Secondary | ICD-10-CM | POA: Diagnosis not present

## 2014-09-25 ENCOUNTER — Ambulatory Visit (INDEPENDENT_AMBULATORY_CARE_PROVIDER_SITE_OTHER): Payer: Medicare Other | Admitting: Family Medicine

## 2014-09-25 ENCOUNTER — Encounter: Payer: Self-pay | Admitting: Family Medicine

## 2014-09-25 VITALS — BP 130/68 | HR 71 | Temp 97.7°F | Ht 63.0 in | Wt 127.6 lb

## 2014-09-25 DIAGNOSIS — J069 Acute upper respiratory infection, unspecified: Secondary | ICD-10-CM

## 2014-09-25 NOTE — Progress Notes (Signed)
   HPI:  URI: -started: 1 week ago -symptoms:nasal congestion, sore throat, cough, ear feel stopped up -denies:fever, SOB, NVD, tooth pain -has tried:  musinex -sick contacts/travel/risks: denies flu exposure, tick exposure or or Ebola risks  ROS: See pertinent positives and negatives per HPI.  Past Medical History  Diagnosis Date  . Diabetes mellitus   . Hyperlipidemia   . Hypertension   . Osteopenia   . Kidney mass   . Atrophy kidney     Past Surgical History  Procedure Laterality Date  . Foot spur      No family history on file.  History   Social History  . Marital Status: Married    Spouse Name: N/A    Number of Children: N/A  . Years of Education: N/A   Social History Main Topics  . Smoking status: Never Smoker   . Smokeless tobacco: Never Used  . Alcohol Use: No  . Drug Use: No  . Sexual Activity: None   Other Topics Concern  . None   Social History Narrative    Current outpatient prescriptions: aspirin 81 MG tablet, Take 81 mg by mouth daily., Disp: , Rfl: ;  losartan (COZAAR) 100 MG tablet, TAKE 1 TABLET BY MOUTH ONCE A DAY., Disp: 90 tablet, Rfl: 1  EXAM:  Filed Vitals:   09/25/14 1425  BP: 130/68  Pulse: 71  Temp: 97.7 F (36.5 C)    Body mass index is 22.61 kg/(m^2).  GENERAL: vitals reviewed and listed above, alert, oriented, appears well hydrated and in no acute distress  HEENT: atraumatic, conjunttiva clear, no obvious abnormalities on inspection of external nose and ears, normal appearance of ear canals and TMs, clear nasal congestion, mild post oropharyngeal erythema with PND, no tonsillar edema or exudate, no sinus TTP  NECK: no obvious masses on inspection  LUNGS: clear to auscultation bilaterally, no wheezes, rales or rhonchi, good air movement  CV: HRRR, no peripheral edema  MS: moves all extremities without noticeable abnormality  PSYCH: pleasant and cooperative, no obvious depression or anxiety  ASSESSMENT AND  PLAN:  Discussed the following assessment and plan:  Acute upper respiratory infection  -given HPI and exam findings today, a serious infection or illness is unlikely. We discussed potential etiologies, with VURI being most likely, and advised supportive care and monitoring. We discussed treatment side effects, likely course, antibiotic misuse, transmission, and signs of developing a serious illness. -offered prescription cough medication - declined -of course, we advised to return or notify a doctor immediately if symptoms worsen or persist or new concerns arise.    Patient Instructions  INSTRUCTIONS FOR UPPER RESPIRATORY INFECTION:  -plenty of rest and fluids  -nasal saline wash 2-3 times daily (use prepackaged nasal saline or bottled/distilled water if making your own)   -can use AFRIN nasal spray for drainage and nasal congestion - but do NOT use longer then 3-4 days  -can use tylenol or ibuprofen as directed for aches and sorethroat  -in the winter time, using a humidifier at night is helpful (please follow cleaning instructions)  -if you are taking a cough medication - use only as directed, may also try a teaspoon of honey to coat the throat and throat lozenges  -for sore throat, salt water gargles can help  -follow up if you have fevers, facial pain, tooth pain, difficulty breathing or are worsening or not getting better in 5-7 days      KIM, HANNAH R.

## 2014-09-25 NOTE — Progress Notes (Signed)
Pre visit review using our clinic review tool, if applicable. No additional management support is needed unless otherwise documented below in the visit note. 

## 2014-09-25 NOTE — Patient Instructions (Signed)

## 2014-10-23 ENCOUNTER — Encounter: Payer: Self-pay | Admitting: Family Medicine

## 2014-10-23 ENCOUNTER — Ambulatory Visit (INDEPENDENT_AMBULATORY_CARE_PROVIDER_SITE_OTHER): Payer: Medicare Other | Admitting: Family Medicine

## 2014-10-23 VITALS — BP 138/86 | Temp 97.6°F | Wt 130.0 lb

## 2014-10-23 DIAGNOSIS — E119 Type 2 diabetes mellitus without complications: Secondary | ICD-10-CM | POA: Diagnosis not present

## 2014-10-23 DIAGNOSIS — I10 Essential (primary) hypertension: Secondary | ICD-10-CM | POA: Diagnosis not present

## 2014-10-23 DIAGNOSIS — E785 Hyperlipidemia, unspecified: Secondary | ICD-10-CM

## 2014-10-23 DIAGNOSIS — N183 Chronic kidney disease, stage 3 unspecified: Secondary | ICD-10-CM | POA: Insufficient documentation

## 2014-10-23 LAB — BASIC METABOLIC PANEL
BUN: 25 mg/dL — ABNORMAL HIGH (ref 6–23)
CALCIUM: 9.6 mg/dL (ref 8.4–10.5)
CHLORIDE: 107 meq/L (ref 96–112)
CO2: 26 meq/L (ref 19–32)
Creatinine, Ser: 0.8 mg/dL (ref 0.4–1.2)
GFR: 71.32 mL/min (ref >60.00)
Glucose, Bld: 138 mg/dL — ABNORMAL HIGH (ref 70–99)
Potassium: 5 mEq/L (ref 3.5–5.1)
SODIUM: 143 meq/L (ref 135–145)

## 2014-10-23 LAB — HEMOGLOBIN A1C: HEMOGLOBIN A1C: 7.4 % — AB (ref 4.6–6.5)

## 2014-10-23 MED ORDER — BLOOD PRESSURE CUFF MISC
Status: DC
Start: 2014-10-23 — End: 2015-12-24

## 2014-10-23 NOTE — Patient Instructions (Signed)
Check a1c and kidney function today. As long as a1c is less than 8 we will not start medication.   Gave you a new meter. See if apothecary can give you low cost blood pressure cuff-sent in prescription.   See you in 6 months. Great to meet you!

## 2014-10-23 NOTE — Assessment & Plan Note (Signed)
Statin intolerant due to hair loss. No clear indication for primary prevention at 87. Continue without medication> Patient states even if we wanted to start one, she would not take one.

## 2014-10-23 NOTE — Assessment & Plan Note (Addendum)
a1c up to 7.4. Given age, living alone and no diabetic complications, F5O goal set for 8 before starting medication. New freestyle meter given.

## 2014-10-23 NOTE — Assessment & Plan Note (Signed)
Controlled on repeat blood pressure check. Continue losartan

## 2014-10-23 NOTE — Progress Notes (Signed)
Linda Reddish, MD Phone: [phone]  Subjective:  Patient presents today to establish care with me as their new primary care provider. Patient was formerly a patient of Dr. Leanne Golden. Chief complaint-noted.   Hypertension-controlled on recheck  BP Readings from Last 3 Encounters:  10/23/14 138/86  09/25/14 130/68  06/26/14 176/77  Home BP monitoring-not checking Compliant with medications-yes without side effects, losartan ROS-Denies any CP, HA, SOB, blurry vision, LE edema.   DIABETES Type II-reasonable control for age  Lab Results  Component Value Date   HGBA1C 7.4* 10/23/2014   HGBA1C 7.2* 04/08/2014   HGBA1C 6.9* 10/06/2013   Medications taking and tolerating-none Blood Sugars per patient-does not check but has freestyle meter at home which is no longer working On Aspirin-yes On statin-no, intolerant Daily foot monitoring-yes  ROS- Denies Polyuria,Polydipsia, nocturia, Vision changes (known cataract in left eye), feet or hand numbness/pain/tingling. Denies  Hypoglycemia symptoms (shaky, sweaty, hungry, weak anxious, tremor, palpitations, confusion, behavior change).   Hyperlipidemia-poor control with last ldl 189 On statin: intolerant, hair loss Regular exercise: no, active at farm though Diet: tries to eat well ROS- no chest pain or shortness of breath. No myalgias  The following were reviewed and entered/updated in epic: Past Medical History  Diagnosis Date  . Diabetes mellitus   . Hyperlipidemia   . Hypertension   . Osteopenia   . Kidney mass     was told was born with this  . Atrophy kidney     was told was born with this   Patient Active Problem List   Diagnosis Date Noted  . Diabetes mellitus, type II 04/18/2007    Priority: High  . Hyperlipidemia 04/18/2007    Priority: Medium  . HYPERTENSION 04/18/2007    Priority: Medium  . Raynaud's disease 10/08/2013    Priority: Low  . Osteoarthritis 07/01/2008    Priority: Low  . Varicose veins 11/04/2007     Priority: Low  . Osteopenia 04/18/2007    Priority: Low   Past Surgical History  Procedure Laterality Date  . Foot spur    . Cataract extraction      bilateral planned-right done, left soon    Family History  Problem Relation Age of Onset  . Hypertension Father   . Alzheimer's disease Mother     Medications- reviewed and updated Current Outpatient Prescriptions  Medication Sig Dispense Refill  . aspirin 81 MG tablet Take 81 mg by mouth daily.    Marland Kitchen losartan (COZAAR) 100 MG tablet TAKE 1 TABLET BY MOUTH ONCE A DAY. 90 tablet 1   No current facility-administered medications for this visit.    Allergies-reviewed and updated Allergies  Allergen Reactions  . Lisinopril     REACTION: cough  . Statins     Hair loss  . Sulfa Antibiotics Other (See Comments)    No reaction-patient states her family member was "burned" from this medication    History   Social History  . Marital Status: Married    Spouse Name: N/A    Number of Children: N/A  . Years of Education: N/A   Social History Main Topics  . Smoking status: Never Smoker   . Smokeless tobacco: Never Used  . Alcohol Use: No  . Drug Use: No  . Sexual Activity: None   Other Topics Concern  . None   Social History Narrative   Widowed 2004. 1 son. 2 adopted grandchildren, 2 greatgrandkids.    Live alone [place] farm. Son lives close as well as grandson (  next door). Completely independent. Still mows her yard.       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       Hobbies: reading, work outside          ROS--See HPI   Objective: BP 138/86 mmHg  Temp(Src) 97.6 F (36.4 C)  Wt 130 lb (58.968 kg) Gen: NAD, resting comfortably HEENT: Mucous membranes are moist. Oropharynx normal. Good dentition, no dentures.  CV: RRR no murmurs rubs or gallops Lungs: CTAB no crackles, wheeze, rhonchi Abdomen: soft/nontender/nondistended/normal bowel sounds.  Ext: no edema Skin: warm, dry, no rash Neuro: grossly normal,  moves all extremities, PERRLA   Assessment/Plan:  Diabetes mellitus, type II a1c up to 7.4. Given age, living alone and no diabetic complications, U9W goal set for 8 before starting medication. New freestyle meter given.   Hyperlipidemia Statin intolerant due to hair loss. No clear indication for primary prevention at 87. Continue without medication> Patient states even if we wanted to start one, she would not take one.   Essential hypertension Controlled on repeat blood pressure check. Continue losartan   Return precautions advised. 6 month follow up  fasting Results for orders placed or performed in visit on 10/23/14 (from the past 24 hour(s))  Hemoglobin A1c     Status: Abnormal   Collection Time: 10/23/14 11:00 AM  Result Value Ref Range   Hgb A1c MFr Bld 7.4 (H) 4.6 - 6.5 %  Basic metabolic panel     Status: Abnormal   Collection Time: 10/23/14 11:00 AM  Result Value Ref Range   Sodium 143 135 - 145 mEq/L   Potassium 5.0 3.5 - 5.1 mEq/L   Chloride 107 96 - 112 mEq/L   CO2 26 19 - 32 mEq/L   Glucose, Bld 138 (H) 70 - 99 mg/dL   BUN 25 (H) 6 - 23 mg/dL   Creatinine, Ser 0.8 0.4 - 1.2 mg/dL   Calcium 9.6 8.4 - 10.5 mg/dL   GFR 71.32 >60.00 mL/min   Wants to see if insurance will pay for blood pressure cuff Meds ordered this encounter  Medications  . Blood Pressure Monitoring (BLOOD PRESSURE CUFF) MISC    Sig: Please provide 1 blood pressure cuff    Dispense:  1 each    Refill:  0

## 2014-11-12 ENCOUNTER — Telehealth: Payer: Self-pay | Admitting: Family Medicine

## 2014-11-12 MED ORDER — LOSARTAN POTASSIUM 100 MG PO TABS: 100.0000 mg | ORAL_TABLET | Freq: Every day | ORAL | Status: DC

## 2014-11-12 NOTE — Telephone Encounter (Signed)
Refill sent.

## 2014-11-12 NOTE — Telephone Encounter (Signed)
Pt request refill losartan (COZAAR) 100 MG tablet [facility]/ [facility] 90 day w/ refills until appt 05/24/15

## 2014-11-18 DIAGNOSIS — H6120 Impacted cerumen, unspecified ear: Secondary | ICD-10-CM | POA: Diagnosis not present

## 2014-12-18 DIAGNOSIS — E1342 Other specified diabetes mellitus with diabetic polyneuropathy: Secondary | ICD-10-CM | POA: Diagnosis not present

## 2014-12-18 DIAGNOSIS — B351 Tinea unguium: Secondary | ICD-10-CM | POA: Diagnosis not present

## 2014-12-18 DIAGNOSIS — L851 Acquired keratosis [keratoderma] palmaris et plantaris: Secondary | ICD-10-CM | POA: Diagnosis not present

## 2014-12-31 DIAGNOSIS — L570 Actinic keratosis: Secondary | ICD-10-CM | POA: Diagnosis not present

## 2014-12-31 DIAGNOSIS — C44310 Basal cell carcinoma of skin of unspecified parts of face: Secondary | ICD-10-CM | POA: Diagnosis not present

## 2014-12-31 DIAGNOSIS — X32XXXD Exposure to sunlight, subsequent encounter: Secondary | ICD-10-CM | POA: Diagnosis not present

## 2014-12-31 DIAGNOSIS — D045 Carcinoma in situ of skin of trunk: Secondary | ICD-10-CM | POA: Diagnosis not present

## 2015-02-11 DIAGNOSIS — Z85828 Personal history of other malignant neoplasm of skin: Secondary | ICD-10-CM | POA: Diagnosis not present

## 2015-02-11 DIAGNOSIS — Z08 Encounter for follow-up examination after completed treatment for malignant neoplasm: Secondary | ICD-10-CM | POA: Diagnosis not present

## 2015-02-11 DIAGNOSIS — C44612 Basal cell carcinoma of skin of right upper limb, including shoulder: Secondary | ICD-10-CM | POA: Diagnosis not present

## 2015-02-26 DIAGNOSIS — L851 Acquired keratosis [keratoderma] palmaris et plantaris: Secondary | ICD-10-CM | POA: Diagnosis not present

## 2015-02-26 DIAGNOSIS — B351 Tinea unguium: Secondary | ICD-10-CM | POA: Diagnosis not present

## 2015-02-26 DIAGNOSIS — E1342 Other specified diabetes mellitus with diabetic polyneuropathy: Secondary | ICD-10-CM | POA: Diagnosis not present

## 2015-04-12 DIAGNOSIS — Z08 Encounter for follow-up examination after completed treatment for malignant neoplasm: Secondary | ICD-10-CM | POA: Diagnosis not present

## 2015-04-12 DIAGNOSIS — X32XXXD Exposure to sunlight, subsequent encounter: Secondary | ICD-10-CM | POA: Diagnosis not present

## 2015-04-12 DIAGNOSIS — D225 Melanocytic nevi of trunk: Secondary | ICD-10-CM | POA: Diagnosis not present

## 2015-04-12 DIAGNOSIS — L570 Actinic keratosis: Secondary | ICD-10-CM | POA: Diagnosis not present

## 2015-04-12 DIAGNOSIS — Z85828 Personal history of other malignant neoplasm of skin: Secondary | ICD-10-CM | POA: Diagnosis not present

## 2015-04-26 DIAGNOSIS — H25012 Cortical age-related cataract, left eye: Secondary | ICD-10-CM | POA: Diagnosis not present

## 2015-04-26 DIAGNOSIS — I10 Essential (primary) hypertension: Secondary | ICD-10-CM | POA: Diagnosis not present

## 2015-04-26 DIAGNOSIS — H2512 Age-related nuclear cataract, left eye: Secondary | ICD-10-CM | POA: Diagnosis not present

## 2015-04-26 DIAGNOSIS — E119 Type 2 diabetes mellitus without complications: Secondary | ICD-10-CM | POA: Diagnosis not present

## 2015-04-26 DIAGNOSIS — H3530 Unspecified macular degeneration: Secondary | ICD-10-CM | POA: Diagnosis not present

## 2015-04-26 DIAGNOSIS — H04123 Dry eye syndrome of bilateral lacrimal glands: Secondary | ICD-10-CM | POA: Diagnosis not present

## 2015-04-29 ENCOUNTER — Telehealth: Payer: Self-pay | Admitting: Family Medicine

## 2015-04-30 NOTE — Telephone Encounter (Signed)
Pt is requesting a rx for diabetic supplies, pharmacy needs to know which meter patient uses.  They can provide the patient with a easy max meter and supplies, if approved.

## 2015-05-03 MED ORDER — GLUCOSE BLOOD VI STRP: ORAL_STRIP | Status: DC

## 2015-05-03 NOTE — Telephone Encounter (Signed)
Rx sent and spoke with patient.

## 2015-05-11 DIAGNOSIS — H2512 Age-related nuclear cataract, left eye: Secondary | ICD-10-CM | POA: Diagnosis not present

## 2015-05-11 DIAGNOSIS — H25812 Combined forms of age-related cataract, left eye: Secondary | ICD-10-CM | POA: Diagnosis not present

## 2015-05-14 DIAGNOSIS — E1342 Other specified diabetes mellitus with diabetic polyneuropathy: Secondary | ICD-10-CM | POA: Diagnosis not present

## 2015-05-14 DIAGNOSIS — L851 Acquired keratosis [keratoderma] palmaris et plantaris: Secondary | ICD-10-CM | POA: Diagnosis not present

## 2015-05-14 DIAGNOSIS — B351 Tinea unguium: Secondary | ICD-10-CM | POA: Diagnosis not present

## 2015-05-24 ENCOUNTER — Ambulatory Visit (INDEPENDENT_AMBULATORY_CARE_PROVIDER_SITE_OTHER): Payer: Medicare Other | Admitting: Family Medicine

## 2015-05-24 ENCOUNTER — Encounter: Payer: Self-pay | Admitting: Family Medicine

## 2015-05-24 VITALS — BP 140/82 | HR 72 | Temp 98.6°F | Wt 129.0 lb

## 2015-05-24 DIAGNOSIS — E875 Hyperkalemia: Secondary | ICD-10-CM | POA: Diagnosis not present

## 2015-05-24 DIAGNOSIS — I10 Essential (primary) hypertension: Secondary | ICD-10-CM | POA: Diagnosis not present

## 2015-05-24 DIAGNOSIS — R312 Other microscopic hematuria: Secondary | ICD-10-CM | POA: Diagnosis not present

## 2015-05-24 DIAGNOSIS — Z23 Encounter for immunization: Secondary | ICD-10-CM

## 2015-05-24 DIAGNOSIS — E119 Type 2 diabetes mellitus without complications: Secondary | ICD-10-CM | POA: Diagnosis not present

## 2015-05-24 DIAGNOSIS — R3129 Other microscopic hematuria: Secondary | ICD-10-CM

## 2015-05-24 LAB — COMPREHENSIVE METABOLIC PANEL
ALT: 26 U/L (ref 0–35)
AST: 23 U/L (ref 0–37)
Albumin: 4.4 g/dL (ref 3.5–5.2)
Alkaline Phosphatase: 73 U/L (ref 39–117)
BUN: 29 mg/dL — AB (ref 6–23)
CALCIUM: 10 mg/dL (ref 8.4–10.5)
CO2: 26 mEq/L (ref 19–32)
Chloride: 107 mEq/L (ref 96–112)
Creatinine, Ser: 0.92 mg/dL (ref 0.40–1.20)
GFR: 61.49 mL/min (ref >60.00)
Glucose, Bld: 142 mg/dL — ABNORMAL HIGH (ref 70–99)
POTASSIUM: 5.3 meq/L — AB (ref 3.5–5.1)
Sodium: 143 mEq/L (ref 135–145)
TOTAL PROTEIN: 7.8 g/dL (ref 6.0–8.3)
Total Bilirubin: 0.5 mg/dL (ref 0.2–1.2)

## 2015-05-24 LAB — HEMOGLOBIN A1C: HEMOGLOBIN A1C: 6.6 % — AB (ref 4.6–6.5)

## 2015-05-24 LAB — POCT URINALYSIS DIPSTICK
Bilirubin, UA: NEGATIVE
GLUCOSE UA: NEGATIVE
KETONES UA: NEGATIVE
Nitrite, UA: NEGATIVE
PH UA: 5.5
Protein, UA: NEGATIVE
SPEC GRAV UA: 1.015
Urobilinogen, UA: 0.2

## 2015-05-24 MED ORDER — LOSARTAN POTASSIUM-HCTZ 100-12.5 MG PO TABS: 1.0000 | ORAL_TABLET | Freq: Every day | ORAL | Status: DC

## 2015-05-24 MED ORDER — LOSARTAN POTASSIUM 100 MG PO TABS: 100.0000 mg | ORAL_TABLET | Freq: Every day | ORAL | Status: DC

## 2015-05-24 NOTE — Patient Instructions (Addendum)
Received final pneumonia shot today ([id number]).  Get diabetic eye exam done at your follow up visit next week and have results faxed to [place] at [phone].  Bloodwork before you go and urine.   See [place] back in 6 months- we will let you know if you need to be seen sooner based off labs  If you get more than 2 readings in a row above 150 on blood pressure, come see [place] as that is a sign may need to increase your medicine

## 2015-05-24 NOTE — Assessment & Plan Note (Addendum)
S: mild poor control, home readings mostly around 140, occasionally above 150 BP Readings from Last 3 Encounters:  05/24/15 140/82  10/23/14 138/86  09/25/14 130/68  A/P: continue current rx given age with losartan 100mg . We discussed if >150/90 on more than 1 occasion in a row, return ot care sooner than 6 months  Addendum: potassium 5.3. Change to losartan-hctz 100-12.5mg  to help control BP and lower potassium. Potassium had been trending up.

## 2015-05-24 NOTE — Progress Notes (Addendum)
[name], MD  Subjective:  Linda Golden is a 81 y.o. year old very pleasant female patient who presents with:  See problem oriented charting ROS- no chest pain, shortness of breath, no hypoglycemia, some blurry vision but this is due to cataract surgery  Past Medical History- hyperlipidemia, varicose veins, raynauds disease, osteopenia, osteoarthritis  Medications- reviewed and updated Current Outpatient Prescriptions  Medication Sig Dispense Refill  . aspirin 81 MG tablet Take 81 mg by mouth daily.    . Blood Pressure Monitoring (BLOOD PRESSURE CUFF) MISC Please provide 1 blood pressure cuff (Patient not taking: Reported on 05/24/2015) 1 each 0  . glucose blood (FREESTYLE LITE) test strip Test once daily. Dx E11.9 100 each 12  . losartan (COZAAR) 100 MG tablet Take 1 tablet (100 mg total) by mouth daily. 90 tablet 1   Objective: BP 140/82 mmHg  Pulse 72  Temp(Src) 98.6 F (37 C)  Wt 129 lb (58.514 kg) Gen: NAD, resting comfortably, appears stated age CV: RRR no murmurs rubs or gallops Lungs: CTAB no crackles, wheeze, rhonchi Abdomen: soft/nontender/nondistended/normal bowel sounds. No rebound or guarding.  Ext: no edema, varicose veins noted Skin: warm, dry, no rash Neuro: grossly normal, moves all extremities  Diabetic Foot Exam - Simple   Simple Foot Form  Diabetic Foot exam was performed with the following findings:  Yes 05/24/2015  9:30 AM  Visual Inspection  See comments:  Yes  Sensation Testing  Intact to touch and monofilament testing bilaterally:  Yes  Pulse Check  Posterior Tibialis and Dorsalis pulse intact bilaterally:  Yes  Comments  Pared down calluses through ortho noted       Assessment/Plan:  Diabetes mellitus, type II S: slightly less active due to cataract surgery but will be more active again within a few weeks Lab Results  Component Value Date   HGBA1C 7.4* 10/23/2014  cbg in 110- 130 range in AM  A/P: check a1c today, goal a1c <8 due to  living alone and age   Essential hypertension S: mild poor control, home readings mostly around 140, occasionally above 150 BP Readings from Last 3 Encounters:  05/24/15 140/82  10/23/14 138/86  09/25/14 130/68  A/P: continue current rx given age with losartan 100mg . We discussed if >150/90 on more than 1 occasion in a row, return ot care sooner than 6 months  Addendum: potassium 5.3. Change to losartan-hctz 100-12.5mg  to help control BP and lower potassium. Potassium had been trending up.   6 months f/u.   Orders Placed This Encounter  Procedures  . Pneumococcal conjugate vaccine 13-valent  . Hemoglobin A1c  . Comprehensive metabolic panel    [place]  . POCT urinalysis dipstick

## 2015-05-24 NOTE — Addendum Note (Signed)
Addended by: Clyde Lundborg A on: 05/24/2015 09:42 AM   Modules accepted: Orders

## 2015-05-24 NOTE — Addendum Note (Signed)
Addended by: Clyde Lundborg A on: 05/24/2015 01:45 PM   Modules accepted: Orders

## 2015-05-24 NOTE — Addendum Note (Signed)
Addended by: Marin Olp on: 05/24/2015 12:23 PM   Modules accepted: Orders

## 2015-05-24 NOTE — Assessment & Plan Note (Signed)
S: slightly less active due to cataract surgery but will be more active again within a few weeks Lab Results  Component Value Date   HGBA1C 7.4* 10/23/2014  cbg in 110- 130 range in AM  A/P: check a1c today, goal a1c <8 due to living alone and age

## 2015-05-24 NOTE — Addendum Note (Signed)
Addended by: Marin Olp on: 05/24/2015 12:44 PM   Modules accepted: Orders, Medications

## 2015-05-31 ENCOUNTER — Telehealth: Payer: Self-pay | Admitting: Family Medicine

## 2015-05-31 NOTE — Telephone Encounter (Signed)
Pt would blood work results. Pt has not taken losartan because bp has not been 150

## 2015-05-31 NOTE — Telephone Encounter (Signed)
Called and lm on pt vm tcb. 

## 2015-06-16 ENCOUNTER — Other Ambulatory Visit (INDEPENDENT_AMBULATORY_CARE_PROVIDER_SITE_OTHER): Payer: Medicare Other

## 2015-06-16 DIAGNOSIS — E875 Hyperkalemia: Secondary | ICD-10-CM | POA: Diagnosis not present

## 2015-06-16 LAB — BASIC METABOLIC PANEL
BUN: 35 mg/dL — AB (ref 6–23)
CO2: 29 meq/L (ref 19–32)
Calcium: 10.2 mg/dL (ref 8.4–10.5)
Chloride: 100 mEq/L (ref 96–112)
Creatinine, Ser: 1.08 mg/dL (ref 0.40–1.20)
GFR: 51.1 mL/min — AB (ref >60.00)
Glucose, Bld: 193 mg/dL — ABNORMAL HIGH (ref 70–99)
Potassium: 5 mEq/L (ref 3.5–5.1)
Sodium: 137 mEq/L (ref 135–145)

## 2015-06-25 ENCOUNTER — Telehealth: Payer: Self-pay | Admitting: Family Medicine

## 2015-06-25 NOTE — Telephone Encounter (Signed)
Pt given lab results 

## 2015-06-25 NOTE — Telephone Encounter (Signed)
Pt would like blood work results °

## 2015-07-03 ENCOUNTER — Encounter (HOSPITAL_COMMUNITY): Payer: Self-pay | Admitting: *Deleted

## 2015-07-03 ENCOUNTER — Inpatient Hospital Stay (HOSPITAL_COMMUNITY)
Admission: EM | Admit: 2015-07-03 | Discharge: 2015-07-06 | DRG: 392 | Disposition: A | Discharge status: Home / Self Care | Payer: Medicare Other | Attending: Internal Medicine | Admitting: Internal Medicine

## 2015-07-03 ENCOUNTER — Emergency Department (HOSPITAL_COMMUNITY): Payer: Medicare Other

## 2015-07-03 DIAGNOSIS — N183 Chronic kidney disease, stage 3 unspecified: Secondary | ICD-10-CM | POA: Diagnosis present

## 2015-07-03 DIAGNOSIS — K5780 Diverticulitis of intestine, part unspecified, with perforation and abscess without bleeding: Secondary | ICD-10-CM | POA: Diagnosis present

## 2015-07-03 DIAGNOSIS — N390 Urinary tract infection, site not specified: Secondary | ICD-10-CM | POA: Diagnosis present

## 2015-07-03 DIAGNOSIS — I10 Essential (primary) hypertension: Secondary | ICD-10-CM | POA: Diagnosis present

## 2015-07-03 DIAGNOSIS — N189 Chronic kidney disease, unspecified: Secondary | ICD-10-CM

## 2015-07-03 DIAGNOSIS — Z7982 Long term (current) use of aspirin: Secondary | ICD-10-CM

## 2015-07-03 DIAGNOSIS — Z79899 Other long term (current) drug therapy: Secondary | ICD-10-CM

## 2015-07-03 DIAGNOSIS — K5720 Diverticulitis of large intestine with perforation and abscess without bleeding: Secondary | ICD-10-CM | POA: Diagnosis not present

## 2015-07-03 DIAGNOSIS — R1031 Right lower quadrant pain: Secondary | ICD-10-CM | POA: Diagnosis not present

## 2015-07-03 DIAGNOSIS — D72829 Elevated white blood cell count, unspecified: Secondary | ICD-10-CM | POA: Diagnosis present

## 2015-07-03 DIAGNOSIS — R110 Nausea: Secondary | ICD-10-CM

## 2015-07-03 DIAGNOSIS — E119 Type 2 diabetes mellitus without complications: Secondary | ICD-10-CM | POA: Diagnosis not present

## 2015-07-03 DIAGNOSIS — E1122 Type 2 diabetes mellitus with diabetic chronic kidney disease: Secondary | ICD-10-CM | POA: Diagnosis not present

## 2015-07-03 DIAGNOSIS — N179 Acute kidney failure, unspecified: Secondary | ICD-10-CM | POA: Diagnosis not present

## 2015-07-03 DIAGNOSIS — Z820 Family history of epilepsy and other diseases of the nervous system: Secondary | ICD-10-CM

## 2015-07-03 DIAGNOSIS — R1032 Left lower quadrant pain: Secondary | ICD-10-CM | POA: Diagnosis not present

## 2015-07-03 DIAGNOSIS — Z1623 Resistance to quinolones and fluoroquinolones: Secondary | ICD-10-CM | POA: Diagnosis present

## 2015-07-03 DIAGNOSIS — N182 Chronic kidney disease, stage 2 (mild): Secondary | ICD-10-CM | POA: Diagnosis not present

## 2015-07-03 DIAGNOSIS — Z8249 Family history of ischemic heart disease and other diseases of the circulatory system: Secondary | ICD-10-CM

## 2015-07-03 DIAGNOSIS — I129 Hypertensive chronic kidney disease with stage 1 through stage 4 chronic kidney disease, or unspecified chronic kidney disease: Secondary | ICD-10-CM | POA: Diagnosis not present

## 2015-07-03 DIAGNOSIS — M8580 Other specified disorders of bone density and structure, unspecified site: Secondary | ICD-10-CM | POA: Diagnosis present

## 2015-07-03 DIAGNOSIS — I152 Hypertension secondary to endocrine disorders: Secondary | ICD-10-CM | POA: Diagnosis present

## 2015-07-03 DIAGNOSIS — I2510 Atherosclerotic heart disease of native coronary artery without angina pectoris: Secondary | ICD-10-CM | POA: Diagnosis present

## 2015-07-03 DIAGNOSIS — E785 Hyperlipidemia, unspecified: Secondary | ICD-10-CM | POA: Diagnosis present

## 2015-07-03 DIAGNOSIS — E1129 Type 2 diabetes mellitus with other diabetic kidney complication: Secondary | ICD-10-CM

## 2015-07-03 DIAGNOSIS — B9620 Unspecified Escherichia coli [E. coli] as the cause of diseases classified elsewhere: Secondary | ICD-10-CM | POA: Diagnosis present

## 2015-07-03 DIAGNOSIS — E1169 Type 2 diabetes mellitus with other specified complication: Secondary | ICD-10-CM | POA: Diagnosis present

## 2015-07-03 DIAGNOSIS — E1159 Type 2 diabetes mellitus with other circulatory complications: Secondary | ICD-10-CM | POA: Diagnosis present

## 2015-07-03 LAB — URINALYSIS, ROUTINE W REFLEX MICROSCOPIC
Glucose, UA: NEGATIVE mg/dL
KETONES UR: NEGATIVE mg/dL
NITRITE: NEGATIVE
PROTEIN: 100 mg/dL — AB
Specific Gravity, Urine: 1.016 (ref 1.005–1.030)
Urobilinogen, UA: 1 mg/dL (ref 0.0–1.0)
pH: 5.5 (ref 5.0–8.0)

## 2015-07-03 LAB — COMPREHENSIVE METABOLIC PANEL
ALT: 21 U/L (ref 14–54)
ANION GAP: 10 (ref 5–15)
AST: 20 U/L (ref 15–41)
Albumin: 4.3 g/dL (ref 3.5–5.0)
Alkaline Phosphatase: 68 U/L (ref 38–126)
BUN: 23 mg/dL — ABNORMAL HIGH (ref 6–20)
CALCIUM: 9.4 mg/dL (ref 8.9–10.3)
CHLORIDE: 101 mmol/L (ref 101–111)
CO2: 26 mmol/L (ref 22–32)
CREATININE: 1.26 mg/dL — AB (ref 0.44–1.00)
GFR, EST AFRICAN AMERICAN: 43 mL/min — AB (ref >60)
GFR, EST NON AFRICAN AMERICAN: 37 mL/min — AB (ref >60)
Glucose, Bld: 145 mg/dL — ABNORMAL HIGH (ref 65–99)
Potassium: 3.8 mmol/L (ref 3.5–5.1)
SODIUM: 137 mmol/L (ref 135–145)
Total Bilirubin: 1 mg/dL (ref 0.3–1.2)
Total Protein: 8.2 g/dL — ABNORMAL HIGH (ref 6.5–8.1)

## 2015-07-03 LAB — CBC
HCT: 41.8 % (ref 36.0–46.0)
HEMOGLOBIN: 14.1 g/dL (ref 12.0–15.0)
MCH: 29.9 pg (ref 26.0–34.0)
MCHC: 33.7 g/dL (ref 30.0–36.0)
MCV: 88.6 fL (ref 78.0–100.0)
PLATELETS: 201 10*3/uL (ref 150–400)
RBC: 4.72 MIL/uL (ref 3.87–5.11)
RDW: 12.1 % (ref 11.5–15.5)
WBC: 10.8 10*3/uL — AB (ref 4.0–10.5)

## 2015-07-03 LAB — URINE MICROSCOPIC-ADD ON

## 2015-07-03 LAB — LIPASE, BLOOD: Lipase: 20 U/L — ABNORMAL LOW (ref 22–51)

## 2015-07-03 LAB — GLUCOSE, CAPILLARY: Glucose-Capillary: 118 mg/dL — ABNORMAL HIGH (ref 65–99)

## 2015-07-03 MED ORDER — CIPROFLOXACIN IN D5W 400 MG/200ML IV SOLN
400.0000 mg | Freq: Once | INTRAVENOUS | Status: AC
  Administered 2015-07-03: 400 mg via INTRAVENOUS
  Filled 2015-07-03: qty 200

## 2015-07-03 MED ORDER — ENOXAPARIN SODIUM 30 MG/0.3ML ~~LOC~~ SOLN
30.0000 mg | SUBCUTANEOUS | Status: DC
  Administered 2015-07-03 – 2015-07-05 (×3): 30 mg via SUBCUTANEOUS
  Filled 2015-07-03 (×4): qty 0.3

## 2015-07-03 MED ORDER — SENNOSIDES-DOCUSATE SODIUM 8.6-50 MG PO TABS
1.0000 | ORAL_TABLET | Freq: Every evening | ORAL | Status: DC | PRN
  Administered 2015-07-04: 1 via ORAL

## 2015-07-03 MED ORDER — SODIUM CHLORIDE 0.9 % IV SOLN
INTRAVENOUS | Status: DC
  Administered 2015-07-03 – 2015-07-05 (×3): via INTRAVENOUS

## 2015-07-03 MED ORDER — METRONIDAZOLE IN NACL 5-0.79 MG/ML-% IV SOLN
500.0000 mg | Freq: Three times a day (TID) | INTRAVENOUS | Status: DC
  Administered 2015-07-03 – 2015-07-04 (×2): 500 mg via INTRAVENOUS
  Filled 2015-07-03 (×3): qty 100

## 2015-07-03 MED ORDER — SODIUM CHLORIDE 0.9 % IV SOLN
INTRAVENOUS | Status: DC
  Administered 2015-07-03: 16:00:00 via INTRAVENOUS

## 2015-07-03 MED ORDER — ACETAMINOPHEN 325 MG PO TABS: 650.0000 mg | ORAL_TABLET | Freq: Four times a day (QID) | ORAL | Status: DC | PRN

## 2015-07-03 MED ORDER — ONDANSETRON HCL 4 MG PO TABS: 4.0000 mg | ORAL_TABLET | Freq: Four times a day (QID) | ORAL | Status: DC | PRN

## 2015-07-03 MED ORDER — ONDANSETRON HCL 4 MG/2ML IJ SOLN
4.0000 mg | Freq: Once | INTRAMUSCULAR | Status: AC
  Administered 2015-07-03: 4 mg via INTRAVENOUS
  Filled 2015-07-03: qty 2

## 2015-07-03 MED ORDER — ASPIRIN EC 81 MG PO TBEC
81.0000 mg | DELAYED_RELEASE_TABLET | Freq: Every day | ORAL | Status: DC
  Administered 2015-07-04 – 2015-07-06 (×3): 81 mg via ORAL
  Filled 2015-07-03 (×3): qty 1

## 2015-07-03 MED ORDER — METRONIDAZOLE IN NACL 5-0.79 MG/ML-% IV SOLN
500.0000 mg | Freq: Once | INTRAVENOUS | Status: DC
  Filled 2015-07-03: qty 100

## 2015-07-03 MED ORDER — ACETAMINOPHEN 650 MG RE SUPP: 650.0000 mg | Freq: Four times a day (QID) | RECTAL | Status: DC | PRN

## 2015-07-03 MED ORDER — IOHEXOL 300 MG/ML  SOLN
100.0000 mL | Freq: Once | INTRAMUSCULAR | Status: AC | PRN
  Administered 2015-07-03: 100 mL via INTRAVENOUS

## 2015-07-03 MED ORDER — MORPHINE SULFATE (PF) 4 MG/ML IV SOLN
4.0000 mg | Freq: Once | INTRAVENOUS | Status: AC
  Administered 2015-07-03: 4 mg via INTRAVENOUS
  Filled 2015-07-03: qty 1

## 2015-07-03 MED ORDER — CIPROFLOXACIN IN D5W 400 MG/200ML IV SOLN
400.0000 mg | Freq: Two times a day (BID) | INTRAVENOUS | Status: DC
  Administered 2015-07-04: 400 mg via INTRAVENOUS
  Filled 2015-07-03: qty 200

## 2015-07-03 MED ORDER — ONDANSETRON HCL 4 MG/2ML IJ SOLN: 4.0000 mg | Freq: Four times a day (QID) | INTRAMUSCULAR | Status: DC | PRN

## 2015-07-03 NOTE — ED Provider Notes (Signed)
Patient presented to the ER with abdominal pain. Patient complaining of lower abdominal and left lower abdominal pain for 3 days. She has a history of diverticulitis. No fever, vomiting, diarrhea or rectal bleeding.  Face to face Exam: HEENT - PERRLA Lungs - CTAB Heart - RRR, no M/R/G Abd - S/ND, tender suprapubic and left lower quadrant without guarding or rebound Neuro - alert, oriented x3  Plan: Urinalysis suggest infection. CT scan to rule out diverticulitis as cause of pain.  Orpah Greek, MD 07/03/15 [phone]

## 2015-07-03 NOTE — ED Provider Notes (Signed)
CSN: [id number]     Arrival date & time 07/03/15  1133 History   First MD Initiated Contact with Patient 07/03/15 1513     Chief Complaint  Patient presents with  . Abdominal Pain     (Consider location/radiation/quality/duration/timing/severity/associated sxs/prior Treatment) Patient is a 81 y.o. female presenting with abdominal pain. The history is provided by the patient and medical records.  Abdominal Pain Associated symptoms: nausea    81 y.o. F with hx of HTN, HLP, DM, osteopenia, presenting to the ED for lower abdominal pain.  Patient states for the past 3 days she has had intermittent lower abdominal pain, described as cramping with sharp, stabbing sensations.  Endorses nausea without vomiting.  No diarrhea.  No melena or hematochezia.  No fever, chills, sweats.  No urinary symptoms or vaginal complaints.  Patient does have hx of diverticulosis.  No prior abdominal surgeries.  Patient has not been able to eat today.  VSS.  Past Medical History  Diagnosis Date  . Diabetes mellitus   . Hyperlipidemia   . Hypertension   . Osteopenia   . Kidney mass     was told was born with this  . Atrophy kidney     was told was born with this   Past Surgical History  Procedure Laterality Date  . Foot spur    . Cataract extraction      bilateral planned-right done, left soon   Family History  Problem Relation Age of Onset  . Hypertension Father   . Alzheimer's disease Mother    Social History  Substance Use Topics  . Smoking status: Never Smoker   . Smokeless tobacco: Never Used  . Alcohol Use: No   OB History    No data available     Review of Systems  Gastrointestinal: Positive for nausea and abdominal pain.  All other systems reviewed and are negative.     Allergies  Lisinopril; Statins; and Sulfa antibiotics  Home Medications   Prior to Admission medications   Medication Sig Start Date End Date Taking? Authorizing Provider  Blood Pressure Monitoring (BLOOD  PRESSURE CUFF) MISC Please provide 1 blood pressure cuff 10/23/14  Yes Marin Olp, MD  glucose blood (FREESTYLE LITE) test strip Test once daily. Dx E11.9 05/03/15  Yes Marin Olp, MD  aspirin 81 MG tablet Take 81 mg by mouth daily.    Historical Provider, MD  losartan-hydrochlorothiazide (HYZAAR) 100-12.5 MG per tablet Take 1 tablet by mouth daily. 05/24/15   Marin Olp, MD   BP 140/57 mmHg  Pulse 88  Temp(Src) 97.7 F (36.5 C) (Oral)  Resp 18  Ht 5\' 3"  (1.6 m)  Wt 129 lb (58.514 kg)  BMI 22.86 kg/m2  SpO2 100%   Physical Exam  Constitutional: She is oriented to person, place, and time. She appears well-developed and well-nourished.  HENT:  Head: Normocephalic and atraumatic.  Mouth/Throat: Oropharynx is clear and moist.  Eyes: Conjunctivae and EOM are normal. Pupils are equal, round, and reactive to light.  Neck: Normal range of motion.  Cardiovascular: Normal rate, regular rhythm and normal heart sounds.   Pulmonary/Chest: Effort normal and breath sounds normal. No respiratory distress. She has no wheezes.  Abdominal: Soft. Bowel sounds are normal. There is tenderness in the left lower quadrant. There is guarding. There is no CVA tenderness.  Abdomen soft, non-distended, TTP in LLQ with voluntary guarding  Musculoskeletal: Normal range of motion.  Neurological: She is alert and oriented to person, place, and  time.  Skin: Skin is warm and dry.  Psychiatric: She has a normal mood and affect.  Nursing note and vitals reviewed.   ED Course  Procedures (including critical care time) Labs Review Labs Reviewed  COMPREHENSIVE METABOLIC PANEL - Abnormal; Notable for the following:    Glucose, Bld 145 (*)    BUN 23 (*)    Creatinine, Ser 1.26 (*)    Total Protein 8.2 (*)    GFR calc non Af Amer 37 (*)    GFR calc Af Amer 43 (*)    All other components within normal limits  CBC - Abnormal; Notable for the following:    WBC 10.8 (*)    All other components within  normal limits  URINALYSIS, ROUTINE W REFLEX MICROSCOPIC (NOT AT [facility]) - Abnormal; Notable for the following:    Color, Urine AMBER (*)    APPearance TURBID (*)    Hgb urine dipstick SMALL (*)    Bilirubin Urine SMALL (*)    Protein, ur 100 (*)    Leukocytes, UA MODERATE (*)    All other components within normal limits  LIPASE, BLOOD - Abnormal; Notable for the following:    Lipase 20 (*)    All other components within normal limits  URINE MICROSCOPIC-ADD ON - Abnormal; Notable for the following:    Squamous Epithelial / LPF FEW (*)    Bacteria, UA MANY (*)    All other components within normal limits  URINE CULTURE    Imaging Review Ct Abdomen Pelvis W Contrast  07/03/2015   CLINICAL DATA:  Bilateral lower abdominal pain, more on the left side. History of diverticulitis. Nausea with no vomiting or diarrhea.  EXAM: CT ABDOMEN AND PELVIS WITH CONTRAST  TECHNIQUE: Multidetector CT imaging of the abdomen and pelvis was performed using the standard protocol following bolus administration of intravenous contrast.  CONTRAST:  113mL OMNIPAQUE IOHEXOL 300 MG/ML  SOLN  COMPARISON:  02/02/2010  FINDINGS: Lung bases: Chronic scarring atelectasis in the posterior inferior right middle lobe with mild scarring in the posterior base of the left upper lobe lingula. Heart mildly enlarged. There are coronary artery calcifications. These findings are stable.  Liver:  Fatty infiltration.  No mass or focal lesion.  Spleen, gallbladder, pancreas, adrenal glands:  Unremarkable.  Kidneys, ureters, bladder: 9 mm mixed density mass arises from the anterior midpole the right kidney. This is similar in size to what was in September 2008. The strongly supports a benign etiology. Tiny presumed cyst arises from the anterior lower pole of the right kidney. There is left renal cortical thinning. No other renal masses. No stones. No hydronephrosis. Ureters normal course and caliber. Bladder are unremarkable.  Uterus and adnexa:  Prominence in the right uterine fundus is similar to the prior study likely a fibroid. Otherwise unremarkable.  Lymph nodes: There are prominent and mildly enlarged porta caval lymph nodes, largest measuring 12.6 mm in short axis, similar to the prior study. No other adenopathy.  Ascites:  None.  Gastrointestinal: There is inflammation of the mid sigmoid colon where there are numerous diverticula. A small fluid collection protrudes from the superior margin of the involved segment of sigmoid colon measuring 14 mm in size consistent with small very diverticular abscess. No extraluminal air. With no other evidence diverticulitis. Small bowel is unremarkable. Normal appendix is visualized. There are numerous other colonic diverted  Musculoskeletal: There are degenerative changes of the lumbar spine and arthropathic changes of both hips. No osteoblastic or osteolytic lesions.  IMPRESSION: 1. Diverticulitis of the mid sigmoid colon with a small, 14 mm cup very diverticular abscess. 2. No other acute findings.   Electronically Signed   By: Lajean Manes M.D.   On: 07/03/2015 17:14   I have personally reviewed and evaluated these images and lab results as part of my medical decision-making.   EKG Interpretation None      MDM   Final diagnoses:  Diverticulitis of large intestine with abscess without bleeding  Nausea  UTI (lower urinary tract infection)   81 year old female here with 3 days of intermittent lower abdominal pain. Patient is afebrile, nontoxic. She has focal tenderness in her left lower quadrant with voluntary guarding. She does have history of diverticulitis, I have clinical concern for the same given her symptoms and tenderness on exam. Lab work was obtained which is overall reassuring, slight leukocytosis noted. UA appears infectious, however patient denies any current urinary symptoms. Urine culture pending. CT scan does reveal diverticulitis of the sigmoid colon with small 14 mm abscess. No  perforation or free air. Patient was started on IV ciprofloxacin and Flagyl and will be admitted to medicine service for further management.  Larene Pickett, PA-C 07/03/15 1847  Orpah Greek, MD 07/04/15 [phone]

## 2015-07-03 NOTE — ED Notes (Signed)
Patient transported to CT 

## 2015-07-03 NOTE — ED Notes (Signed)
Awake. Verbally responsive. Resp even and unlabored. No audible adventitious breath sounds noted. ABC's intact. Abd soft/nondistended but tender to palpate. BS (+) and active x4 quadrants. No N/V/D reported. IV infusing NS at 148ml/hr without difficulty. Family at bedside.

## 2015-07-03 NOTE — H&P (Signed)
[facility]          History and Physical    PCP:   [name], MD   EDP: Malachy Moan, EDP  Chief Complaint:  Abdominal pain  HPI: Patient is a very pleasant 81 year old woman with past medical history significant for diet-controlled diabetes, cholesterolemia not currently on medications, hypertension who presents to the hospital today with a three-day history of left lower quadrant pain, mild nausea but no vomiting, denies fevers or chills. Decided to come into the hospital today for evaluation at the urging of her son. Patient is very active and independent and still drives. On CT scan she was found to have diverticulitis of the mid sigmoid colon with a small 14 mm abscess, very mild leukocytosis of 10.8. We have been asked to admit her for further evaluation and management.  Allergies:   Allergies  Allergen Reactions  . Lisinopril Cough  . Statins Other (See Comments)    Hair loss  . Sulfa Antibiotics Other (See Comments)    No reaction-patient states her family member was "burned" from this medication      Past Medical History  Diagnosis Date  . Diabetes mellitus   . Hyperlipidemia   . Hypertension   . Osteopenia   . Kidney mass     was told was born with this  . Atrophy kidney     was told was born with this    Past Surgical History  Procedure Laterality Date  . Foot spur    . Cataract extraction      bilateral planned-right done, left soon    Prior to Admission medications   Medication Sig Start Date End Date Taking? Authorizing Ji Feldner  acetaminophen (TYLENOL) 500 MG tablet Take 500 mg by mouth every 6 (six) hours as needed for mild pain, moderate pain, fever or headache.   Yes Historical Shalen Petrak, MD  aspirin EC 81 MG tablet Take 81 mg by mouth daily.   Yes Historical Dorell Gatlin, MD  Blood Pressure Monitoring (BLOOD PRESSURE CUFF) MISC Please provide 1 blood pressure cuff 10/23/14  Yes Marin Olp, MD  glucose blood  (FREESTYLE LITE) test strip Test once daily. Dx E11.9 05/03/15  Yes Marin Olp, MD  losartan-hydrochlorothiazide (HYZAAR) 100-12.5 MG per tablet Take 1 tablet by mouth daily. 05/24/15  Yes Marin Olp, MD    Social History:  reports that she has never smoked. She has never used smokeless tobacco. She reports that she does not drink alcohol or use illicit drugs.  Family History  Problem Relation Age of Onset  . Hypertension Father   . Alzheimer's disease Mother     Review of Systems:  Constitutional: Denies fever, chills, diaphoresis, appetite change and fatigue.  HEENT: Denies photophobia, eye pain, redness, hearing loss, ear pain, congestion, sore throat, rhinorrhea, sneezing, mouth sores, trouble swallowing, neck pain, neck stiffness and tinnitus.   Respiratory: Denies SOB, DOE, cough, chest tightness,  and wheezing.   Cardiovascular: Denies chest pain, palpitations and leg swelling.  Gastrointestinal: Denies vomiting, diarrhea, constipation, blood in stool and abdominal distention.  Genitourinary: Denies dysuria, urgency, frequency, hematuria, flank pain and difficulty urinating.  Endocrine: Denies: hot or cold intolerance, sweats, changes in hair or nails, polyuria, polydipsia. Musculoskeletal: Denies myalgias, back pain, joint swelling, arthralgias and gait problem.  Skin: Denies pallor, rash and wound.  Neurological: Denies dizziness, seizures, syncope, weakness, light-headedness, numbness and headaches.  Hematological: Denies adenopathy.  Easy bruising, personal or family bleeding history  Psychiatric/Behavioral: Denies suicidal ideation, mood changes, confusion, nervousness, sleep disturbance and agitation   Physical Exam: Blood pressure 135/65, pulse 68, temperature 98 F (36.7 C), temperature source Oral, resp. rate 20, height _0  (1.6 m), weight 58.514 kg (129 lb), SpO2 97 %. General: Alert, awake, oriented 3, no distress. HEENT: Normocephalic, atraumatic, pupils  equal round and reactive to light, extraocular movements intact, moist mucous membranes. Neck: Supple, no JVD, no lymphadenopathy, no bruits, no quarter. Cardiovascular: Regular rate and rhythm, no murmurs, rubs or gallops  Extremities: No clubbing, cyanosis or edema, positive pulses. Neurologic: Grossly intact and nonfocal.  Labs on Admission:  Results for orders placed or performed during the hospital encounter of 07/03/15 (from the past 48 hour(s))  Comprehensive metabolic panel     Status: Abnormal   Collection Time: 07/03/15 12:10 PM  Result Value Ref Range   Sodium 137 135 - 145 mmol/L   Potassium 3.8 3.5 - 5.1 mmol/L   Chloride 101 101 - 111 mmol/L   CO2 26 22 - 32 mmol/L   Glucose, Bld 145 (H) 65 - 99 mg/dL   BUN 23 (H) 6 - 20 mg/dL   Creatinine, Ser 1.26 (H) 0.44 - 1.00 mg/dL   Calcium 9.4 8.9 - 10.3 mg/dL   Total Protein 8.2 (H) 6.5 - 8.1 g/dL   Albumin 4.3 3.5 - 5.0 g/dL   AST 20 15 - 41 U/L   ALT 21 14 - 54 U/L   Alkaline Phosphatase 68 38 - 126 U/L   Total Bilirubin 1.0 0.3 - 1.2 mg/dL   GFR calc non Af Amer 37 (L) >60 mL/min   GFR calc Af Amer 43 (L) >60 mL/min    Comment: (NOTE) The eGFR has been calculated using the CKD EPI equation. This calculation has not been validated in all clinical situations. eGFR's persistently <60 mL/min signify possible Chronic Kidney Disease.    Anion gap 10 5 - 15  CBC     Status: Abnormal   Collection Time: 07/03/15 12:10 PM  Result Value Ref Range   WBC 10.8 (H) 4.0 - 10.5 K/uL   RBC 4.72 3.87 - 5.11 MIL/uL   Hemoglobin 14.1 12.0 - 15.0 g/dL   HCT 41.8 36.0 - 46.0 %   MCV 88.6 78.0 - 100.0 fL   MCH 29.9 26.0 - 34.0 pg   MCHC 33.7 30.0 - 36.0 g/dL   RDW 12.1 11.5 - 15.5 %   Platelets 201 150 - 400 K/uL  Lipase, blood     Status: Abnormal   Collection Time: 07/03/15 12:10 PM  Result Value Ref Range   Lipase 20 (L) 22 - 51 U/L  Urinalysis, Routine w reflex microscopic (not at [facility])     Status: Abnormal   Collection Time:  07/03/15 12:15 PM  Result Value Ref Range   Color, Urine AMBER (A) YELLOW    Comment: BIOCHEMICALS MAY BE AFFECTED BY COLOR   APPearance TURBID (A) CLEAR   Specific Gravity, Urine 1.016 1.005 - 1.030   pH 5.5 5.0 - 8.0   Glucose, UA NEGATIVE NEGATIVE mg/dL   Hgb urine dipstick SMALL (A) NEGATIVE   Bilirubin Urine SMALL (A) NEGATIVE   Ketones, ur NEGATIVE NEGATIVE mg/dL   Protein, ur 100 (A) NEGATIVE mg/dL   Urobilinogen, UA 1.0 0.0 - 1.0 mg/dL   Nitrite NEGATIVE NEGATIVE   Leukocytes, UA MODERATE (A) NEGATIVE  Urine microscopic-add on     Status: Abnormal  Collection Time: 07/03/15 12:15 PM  Result Value Ref Range   Squamous Epithelial / LPF FEW (A) RARE   WBC, UA 21-50 <3 WBC/hpf   RBC / HPF 3-6 <3 RBC/hpf   Bacteria, UA MANY (A) RARE    Radiological Exams on Admission: Ct Abdomen Pelvis W Contrast  07/03/2015   CLINICAL DATA:  Bilateral lower abdominal pain, more on the left side. History of diverticulitis. Nausea with no vomiting or diarrhea.  EXAM: CT ABDOMEN AND PELVIS WITH CONTRAST  TECHNIQUE: Multidetector CT imaging of the abdomen and pelvis was performed using the standard protocol following bolus administration of intravenous contrast.  CONTRAST:  156m OMNIPAQUE IOHEXOL 300 MG/ML  SOLN  COMPARISON:  02/02/2010  FINDINGS: Lung bases: Chronic scarring atelectasis in the posterior inferior right middle lobe with mild scarring in the posterior base of the left upper lobe lingula. Heart mildly enlarged. There are coronary artery calcifications. These findings are stable.  Liver:  Fatty infiltration.  No mass or focal lesion.  Spleen, gallbladder, pancreas, adrenal glands:  Unremarkable.  Kidneys, ureters, bladder: 9 mm mixed density mass arises from the anterior midpole the right kidney. This is similar in size to what was in September 2008. The strongly supports a benign etiology. Tiny presumed cyst arises from the anterior lower pole of the right kidney. There is left renal  cortical thinning. No other renal masses. No stones. No hydronephrosis. Ureters normal course and caliber. Bladder are unremarkable.  Uterus and adnexa: Prominence in the right uterine fundus is similar to the prior study likely a fibroid. Otherwise unremarkable.  Lymph nodes: There are prominent and mildly enlarged porta caval lymph nodes, largest measuring 12.6 mm in short axis, similar to the prior study. No other adenopathy.  Ascites:  None.  Gastrointestinal: There is inflammation of the mid sigmoid colon where there are numerous diverticula. A small fluid collection protrudes from the superior margin of the involved segment of sigmoid colon measuring 14 mm in size consistent with small very diverticular abscess. No extraluminal air. With no other evidence diverticulitis. Small bowel is unremarkable. Normal appendix is visualized. There are numerous other colonic diverted  Musculoskeletal: There are degenerative changes of the lumbar spine and arthropathic changes of both hips. No osteoblastic or osteolytic lesions.  IMPRESSION: 1. Diverticulitis of the mid sigmoid colon with a small, 14 mm cup very diverticular abscess. 2. No other acute findings.   Electronically Signed   By: DLajean ManesM.D.   On: 07/03/2015 17:14    Assessment/Plan Principal Problem:   Diverticulitis of intestine with abscess Active Problems:   Diabetes mellitus, type II   Hyperlipidemia   Essential hypertension   CKD (chronic kidney disease), stage II   Renal failure (ARF), acute on chronic    Diverticulitis of intestine with abscess formation -We'll admit to the hospital under observation status for monitoring and IV antibiotics. -She has already been started on a clear liquid diet and is doing well with that, will advance diet as tolerated. -As long as she does not become febrile, nauseated or have increased abdominal pain, suspect she might be able to be discharged from the hospital over the next 24-48  hours.  Acute on chronic kidney disease stage II -Baseline creatinine around 1.06. -Creatinine is 1.26 on admission. -We'll give IV fluids, hold ARB/diuretic. -Recheck renal function in a.m.  Type 2 diabetes -Is diet controlled. -Check hemoglobin A1c, monitor CBGs every before meals and daily at bedtime. Next  Hypertension -Monitor off of antihypertensives which  have been placed on hold given acute renal failure.  DVT prophylaxis -Lovenox  CODE STATUS -Full code as discussed with patient and son at bedside.   Time Spent on Admission: 85 minutes  [facility] Pager: [phone] 07/03/2015, 7:45 PM

## 2015-07-03 NOTE — ED Notes (Signed)
Awake. Verbally responsive. A/O x4. Resp even and unlabored. No audible adventitious breath sounds noted. ABC's intact. IV infusing NS at 168ml/hr without difficulty. Family at bedside.

## 2015-07-03 NOTE — ED Notes (Signed)
Pt reports bilateral lower abd.pain, more so on the left side, hx of diverticulitis, reports nausea, no vomiting or diarrhea

## 2015-07-03 NOTE — ED Notes (Addendum)
Pt reported intermittent lower abd pain x 3 days, no nausea without emesis/diarrhea. Pt denies urinary problems.

## 2015-07-04 DIAGNOSIS — B9620 Unspecified Escherichia coli [E. coli] as the cause of diseases classified elsewhere: Secondary | ICD-10-CM | POA: Diagnosis present

## 2015-07-04 DIAGNOSIS — E785 Hyperlipidemia, unspecified: Secondary | ICD-10-CM | POA: Diagnosis not present

## 2015-07-04 DIAGNOSIS — E119 Type 2 diabetes mellitus without complications: Secondary | ICD-10-CM | POA: Diagnosis present

## 2015-07-04 DIAGNOSIS — N182 Chronic kidney disease, stage 2 (mild): Secondary | ICD-10-CM

## 2015-07-04 DIAGNOSIS — Z8249 Family history of ischemic heart disease and other diseases of the circulatory system: Secondary | ICD-10-CM | POA: Diagnosis not present

## 2015-07-04 DIAGNOSIS — M8580 Other specified disorders of bone density and structure, unspecified site: Secondary | ICD-10-CM | POA: Diagnosis present

## 2015-07-04 DIAGNOSIS — N179 Acute kidney failure, unspecified: Secondary | ICD-10-CM | POA: Diagnosis present

## 2015-07-04 DIAGNOSIS — I2510 Atherosclerotic heart disease of native coronary artery without angina pectoris: Secondary | ICD-10-CM | POA: Diagnosis present

## 2015-07-04 DIAGNOSIS — E11349 Type 2 diabetes mellitus with severe nonproliferative diabetic retinopathy without macular edema: Secondary | ICD-10-CM | POA: Diagnosis not present

## 2015-07-04 DIAGNOSIS — N390 Urinary tract infection, site not specified: Secondary | ICD-10-CM | POA: Diagnosis present

## 2015-07-04 DIAGNOSIS — K5720 Diverticulitis of large intestine with perforation and abscess without bleeding: Secondary | ICD-10-CM | POA: Diagnosis present

## 2015-07-04 DIAGNOSIS — D72829 Elevated white blood cell count, unspecified: Secondary | ICD-10-CM | POA: Diagnosis present

## 2015-07-04 DIAGNOSIS — I10 Essential (primary) hypertension: Secondary | ICD-10-CM | POA: Diagnosis not present

## 2015-07-04 DIAGNOSIS — Z79899 Other long term (current) drug therapy: Secondary | ICD-10-CM | POA: Diagnosis not present

## 2015-07-04 DIAGNOSIS — R1032 Left lower quadrant pain: Secondary | ICD-10-CM | POA: Diagnosis not present

## 2015-07-04 DIAGNOSIS — Z7982 Long term (current) use of aspirin: Secondary | ICD-10-CM | POA: Diagnosis not present

## 2015-07-04 DIAGNOSIS — Z1623 Resistance to quinolones and fluoroquinolones: Secondary | ICD-10-CM | POA: Diagnosis present

## 2015-07-04 DIAGNOSIS — K5700 Diverticulitis of small intestine with perforation and abscess without bleeding: Secondary | ICD-10-CM

## 2015-07-04 DIAGNOSIS — Z820 Family history of epilepsy and other diseases of the nervous system: Secondary | ICD-10-CM | POA: Diagnosis not present

## 2015-07-04 DIAGNOSIS — I129 Hypertensive chronic kidney disease with stage 1 through stage 4 chronic kidney disease, or unspecified chronic kidney disease: Secondary | ICD-10-CM | POA: Diagnosis present

## 2015-07-04 LAB — COMPREHENSIVE METABOLIC PANEL
ALK PHOS: 52 U/L (ref 38–126)
ALT: 14 U/L (ref 14–54)
ANION GAP: 8 (ref 5–15)
AST: 17 U/L (ref 15–41)
Albumin: 3.3 g/dL — ABNORMAL LOW (ref 3.5–5.0)
BILIRUBIN TOTAL: 0.9 mg/dL (ref 0.3–1.2)
BUN: 21 mg/dL — ABNORMAL HIGH (ref 6–20)
CALCIUM: 8.5 mg/dL — AB (ref 8.9–10.3)
CO2: 26 mmol/L (ref 22–32)
Chloride: 105 mmol/L (ref 101–111)
Creatinine, Ser: 1.13 mg/dL — ABNORMAL HIGH (ref 0.44–1.00)
GFR, EST AFRICAN AMERICAN: 50 mL/min — AB (ref >60)
GFR, EST NON AFRICAN AMERICAN: 43 mL/min — AB (ref >60)
Glucose, Bld: 113 mg/dL — ABNORMAL HIGH (ref 65–99)
Potassium: 3.9 mmol/L (ref 3.5–5.1)
SODIUM: 139 mmol/L (ref 135–145)
TOTAL PROTEIN: 6.6 g/dL (ref 6.5–8.1)

## 2015-07-04 LAB — GLUCOSE, CAPILLARY
GLUCOSE-CAPILLARY: 139 mg/dL — AB (ref 65–99)
GLUCOSE-CAPILLARY: 111 mg/dL — AB (ref 65–99)
GLUCOSE-CAPILLARY: 111 mg/dL — AB (ref 65–99)
GLUCOSE-CAPILLARY: 127 mg/dL — AB (ref 65–99)

## 2015-07-04 LAB — CBC
HCT: 35.4 % — ABNORMAL LOW (ref 36.0–46.0)
HEMOGLOBIN: 11.7 g/dL — AB (ref 12.0–15.0)
MCH: 29.3 pg (ref 26.0–34.0)
MCHC: 33.1 g/dL (ref 30.0–36.0)
MCV: 88.5 fL (ref 78.0–100.0)
Platelets: 170 10*3/uL (ref 150–400)
RBC: 4 MIL/uL (ref 3.87–5.11)
RDW: 12.3 % (ref 11.5–15.5)
WBC: 7.7 10*3/uL (ref 4.0–10.5)

## 2015-07-04 MED ORDER — HYDRALAZINE HCL 20 MG/ML IJ SOLN: 10.0000 mg | Freq: Three times a day (TID) | INTRAMUSCULAR | Status: DC | PRN

## 2015-07-04 MED ORDER — PIPERACILLIN-TAZOBACTAM 3.375 G IVPB
3.3750 g | Freq: Three times a day (TID) | INTRAVENOUS | Status: DC
  Administered 2015-07-04 – 2015-07-06 (×7): 3.375 g via INTRAVENOUS
  Filled 2015-07-04 (×8): qty 50

## 2015-07-04 NOTE — Progress Notes (Signed)
[facility] PROGRESS NOTE  Linda Golden BSW:[id number] DOB: [birth year] DOA: 07/03/2015 PCP: [name], MD  Assessment/Plan: 1-Acute diverticulitis with 14 mm abscess;  -Abdominal pain has improved. Continue with clear diet.  -Will change IV ciprofloxacin and flagyl to zosyn for better coverage. Discussed with surgery.  -Monitor WBC and clinically. If patient get worse will consult surgery.   DVT prophylaxis:  Lovenox.   Hypertension -Monitor off of antihypertensives.   Acute on chronic kidney disease stage II -Baseline creatinine around 1.06. -Creatinine is 1.26 on admission. -hold ARB/diuretic. -Continue with IV fluids.   Type 2 diabetes -Is diet controlled. -hemoglobin A1-c at 6.6 last month.  -check CBG.   UTI; UA with 21-50 WBC. Follow urine culture. On zosyn.   Code Status: Full code.  Family Communication: Care discussed with patient.  Disposition Plan: Remain inpatient for treatment of diverticulitis.    Consultants:  none  Procedures:  none  Antibiotics:  Zosyn 9-18  HPI/Subjective: She is feeling better today.  Abdominal pain is much better. Was lower quadrant, now is just left side. Pain is intermittent. No BM. She is willigng to eat.   Objective: Filed Vitals:   07/04/15 0509  BP: 128/38  Pulse: 57  Temp: 98.7 F (37.1 C)  Resp: 18    Intake/Output Summary (Last 24 hours) at 07/04/15 0813 Last data filed at 07/04/15 0509  Gross per 24 hour  Intake    120 ml  Output      0 ml  Net    120 ml   Filed Weights   07/03/15 1144  Weight: 58.514 kg (129 lb)    Exam:   General:  NAD  Cardiovascular: S 1, S 2 RRR  Respiratory: CTA  Abdomen: BS present, soft, no tenderness  Musculoskeletal: no edema  Data Reviewed: Basic Metabolic Panel:  Recent Labs Lab 07/03/15 1210 07/04/15 0505  NA 137 139  K 3.8 3.9  CL 101 105  CO2 26 26  GLUCOSE 145* 113*  BUN 23* 21*  CREATININE 1.26* 1.13*  CALCIUM 9.4 8.5*    Liver Function Tests:  Recent Labs Lab 07/03/15 1210 07/04/15 0505  AST 20 17  ALT 21 14  ALKPHOS 68 52  BILITOT 1.0 0.9  PROT 8.2* 6.6  ALBUMIN 4.3 3.3*    Recent Labs Lab 07/03/15 1210  LIPASE 20*   No results for input(s): AMMONIA in the last 168 hours. CBC:  Recent Labs Lab 07/03/15 1210 07/04/15 0505  WBC 10.8* 7.7  HGB 14.1 11.7*  HCT 41.8 35.4*  MCV 88.6 88.5  PLT 201 170   Cardiac Enzymes: No results for input(s): CKTOTAL, CKMB, CKMBINDEX, TROPONINI in the last 168 hours. BNP (last 3 results) No results for input(s): BNP in the last 8760 hours.  ProBNP (last 3 results) No results for input(s): PROBNP in the last 8760 hours.  CBG:  Recent Labs Lab 07/03/15 2221 07/04/15 0723  GLUCAP 118* 139*    No results found for this or any previous visit (from the past 240 hour(s)).   Studies: Ct Abdomen Pelvis W Contrast  07/03/2015   CLINICAL DATA:  Bilateral lower abdominal pain, more on the left side. History of diverticulitis. Nausea with no vomiting or diarrhea.  EXAM: CT ABDOMEN AND PELVIS WITH CONTRAST  TECHNIQUE: Multidetector CT imaging of the abdomen and pelvis was performed using the standard protocol following bolus administration of intravenous contrast.  CONTRAST:  139mL OMNIPAQUE IOHEXOL 300 MG/ML  SOLN  COMPARISON:  02/02/2010  FINDINGS:  Lung bases: Chronic scarring atelectasis in the posterior inferior right middle lobe with mild scarring in the posterior base of the left upper lobe lingula. Heart mildly enlarged. There are coronary artery calcifications. These findings are stable.  Liver:  Fatty infiltration.  No mass or focal lesion.  Spleen, gallbladder, pancreas, adrenal glands:  Unremarkable.  Kidneys, ureters, bladder: 9 mm mixed density mass arises from the anterior midpole the right kidney. This is similar in size to what was in September 2008. The strongly supports a benign etiology. Tiny presumed cyst arises from the anterior lower pole  of the right kidney. There is left renal cortical thinning. No other renal masses. No stones. No hydronephrosis. Ureters normal course and caliber. Bladder are unremarkable.  Uterus and adnexa: Prominence in the right uterine fundus is similar to the prior study likely a fibroid. Otherwise unremarkable.  Lymph nodes: There are prominent and mildly enlarged porta caval lymph nodes, largest measuring 12.6 mm in short axis, similar to the prior study. No other adenopathy.  Ascites:  None.  Gastrointestinal: There is inflammation of the mid sigmoid colon where there are numerous diverticula. A small fluid collection protrudes from the superior margin of the involved segment of sigmoid colon measuring 14 mm in size consistent with small very diverticular abscess. No extraluminal air. With no other evidence diverticulitis. Small bowel is unremarkable. Normal appendix is visualized. There are numerous other colonic diverted  Musculoskeletal: There are degenerative changes of the lumbar spine and arthropathic changes of both hips. No osteoblastic or osteolytic lesions.  IMPRESSION: 1. Diverticulitis of the mid sigmoid colon with a small, 14 mm cup very diverticular abscess. 2. No other acute findings.   Electronically Signed   By: Lajean Manes M.D.   On: 07/03/2015 17:14    Scheduled Meds: . aspirin EC  81 mg Oral Daily  . ciprofloxacin  400 mg Intravenous Q12H  . enoxaparin (LOVENOX) injection  30 mg Subcutaneous Q24H  . metronidazole  500 mg Intravenous Q8H   Continuous Infusions: . sodium chloride 75 mL/hr at 07/03/15 2333    Principal Problem:   Diverticulitis of intestine with abscess Active Problems:   Diabetes mellitus, type II   Hyperlipidemia   Essential hypertension   CKD (chronic kidney disease), stage II   Renal failure (ARF), acute on chronic    Time spent: 35 minutes.     Niel Hummer A  [facility] Pager [phone]. If 7PM-7AM, please contact night-coverage at  [email or web address], password [facility] 07/04/2015, 8:13 AM  LOS: 1 day

## 2015-07-04 NOTE — Progress Notes (Signed)
ANTIBIOTIC CONSULT NOTE  Pharmacy Consult for Zosyn Indication: intra-abdominal infection  Allergies  Allergen Reactions  . Lisinopril Cough  . Statins Other (See Comments)    Hair loss  . Sulfa Antibiotics Other (See Comments)    No reaction-patient states her family member was "burned" from this medication    Patient Measurements: Height: 5\' 3"  (160 cm) Weight: 129 lb (58.514 kg) IBW/kg (Calculated) : 52.4  Vital Signs: Temp: 98.7 F (37.1 C) (09/18 0509) Temp Source: Oral (09/18 0509) BP: 128/38 mmHg (09/18 0509) Pulse Rate: 57 (09/18 0509) Intake/Output from previous day: 09/17 0701 - 09/18 0700 In: 120 [P.O.:120] Out: -  Intake/Output from this shift:    Labs:  Recent Labs  07/03/15 1210 07/04/15 0505  WBC 10.8* 7.7  HGB 14.1 11.7*  PLT 201 170  CREATININE 1.26* 1.13*   Estimated Creatinine Clearance: 29.6 mL/min (by C-G formula based on Cr of 1.13). No results for input(s): VANCOTROUGH, VANCOPEAK, VANCORANDOM, GENTTROUGH, GENTPEAK, GENTRANDOM, TOBRATROUGH, TOBRAPEAK, TOBRARND, AMIKACINPEAK, AMIKACINTROU, AMIKACIN in the last 72 hours.   Microbiology: No results found for this or any previous visit (from the past 720 hour(s)).  Anti-infectives    Start     Dose/Rate Route Frequency Ordered Stop   07/04/15 0600  ciprofloxacin (CIPRO) IVPB 400 mg  Status:  Discontinued     400 mg 200 mL/hr over 60 Minutes Intravenous Every 12 hours 07/03/15 1945 07/04/15 0820   07/03/15 2000  metroNIDAZOLE (FLAGYL) IVPB 500 mg  Status:  Discontinued     500 mg 100 mL/hr over 60 Minutes Intravenous Every 8 hours 07/03/15 1945 07/04/15 0820   07/03/15 1745  ciprofloxacin (CIPRO) IVPB 400 mg     400 mg 200 mL/hr over 60 Minutes Intravenous  Once 07/03/15 1744 07/03/15 1904   07/03/15 1745  metroNIDAZOLE (FLAGYL) IVPB 500 mg  Status:  Discontinued     500 mg 100 mL/hr over 60 Minutes Intravenous  Once 07/03/15 1744 07/03/15 1945      Assessment: 81 yo F with hx  diverticulitis presented with abdominal pain and is currently on IV antibiotics for diverticulitis with abscess formation.  She was initially started on Cipro/Flagyl however now asked to broaden to [place] after discussion with surgery.   She is currently afebrile with normal WBC. Acute on chronic renal failure improved ( Scr 1.26-->1.13).  Estimated CrCl > 31ml/min.    9/17 >>Cipro  >>9/18 9/17 >>Flagyl  >>9/18 9/18>>Zosyn>>  9/17 urine: IP  Dose changes/levels:  Goal of Therapy:  Eradicate infection.  Plan:  Zosyn 3.375gm IV Q8h to be infused over 4hrs Monitor renal function and cx data   Biagio Borg 07/04/2015,8:22 AM

## 2015-07-05 DIAGNOSIS — E11349 Type 2 diabetes mellitus with severe nonproliferative diabetic retinopathy without macular edema: Secondary | ICD-10-CM

## 2015-07-05 LAB — CBC
HEMATOCRIT: 36.3 % (ref 36.0–46.0)
Hemoglobin: 12.2 g/dL (ref 12.0–15.0)
MCH: 29.7 pg (ref 26.0–34.0)
MCHC: 33.6 g/dL (ref 30.0–36.0)
MCV: 88.3 fL (ref 78.0–100.0)
Platelets: 196 10*3/uL (ref 150–400)
RBC: 4.11 MIL/uL (ref 3.87–5.11)
RDW: 12.2 % (ref 11.5–15.5)
WBC: 6.2 10*3/uL (ref 4.0–10.5)

## 2015-07-05 LAB — BASIC METABOLIC PANEL
Anion gap: 7 (ref 5–15)
BUN: 15 mg/dL (ref 6–20)
CHLORIDE: 102 mmol/L (ref 101–111)
CO2: 27 mmol/L (ref 22–32)
CREATININE: 1.21 mg/dL — AB (ref 0.44–1.00)
Calcium: 8.7 mg/dL — ABNORMAL LOW (ref 8.9–10.3)
GFR calc Af Amer: 46 mL/min — ABNORMAL LOW (ref >60)
GFR calc non Af Amer: 39 mL/min — ABNORMAL LOW (ref >60)
GLUCOSE: 117 mg/dL — AB (ref 65–99)
Potassium: 3.9 mmol/L (ref 3.5–5.1)
SODIUM: 136 mmol/L (ref 135–145)

## 2015-07-05 LAB — GLUCOSE, CAPILLARY
GLUCOSE-CAPILLARY: 95 mg/dL (ref 65–99)
Glucose-Capillary: 107 mg/dL — ABNORMAL HIGH (ref 65–99)
Glucose-Capillary: 158 mg/dL — ABNORMAL HIGH (ref 65–99)
Glucose-Capillary: 104 mg/dL — ABNORMAL HIGH (ref 65–99)

## 2015-07-05 NOTE — Care Management Note (Signed)
Case Management Note  Patient Details  Name: Linda Golden MRN: [id number] Date of Birth: March 22, 1927  Subjective/Objective:81 y/o f admitted w/Diverticulitis, abscess.Indep PTA. From home.Per nsg no PT cons necessary.                    Action/Plan:d/c plan home.No anticipated d/c needs.   Expected Discharge Date:                 Expected Discharge Plan:  Home/Self Care  In-House Referral:     Discharge planning Services  CM Consult  Post Acute Care Choice:    Choice offered to:     DME Arranged:    DME Agency:     HH Arranged:    HH Agency:     Status of Service:  In process, will continue to follow  Medicare Important Message Given:  Yes-second notification given Date Medicare IM Given:    Medicare IM give by:    Date Additional Medicare IM Given:    Additional Medicare Important Message give by:     If discussed at [facility] of Stay Meetings, dates discussed:    Additional Comments:  Dessa Phi, RN 07/05/2015, 12:26 PM

## 2015-07-05 NOTE — Care Management Important Message (Signed)
Important Message  Patient Details  Name: Linda Golden MRN: [id number] Date of Birth: [birth year]   Medicare Important Message Given:  Yes-second notification given    Camillo Flaming 07/05/2015, 11:45 AMImportant Message  Patient Details  Name: Linda Golden MRN: [id number] Date of Birth: 1926/11/06   Medicare Important Message Given:  Yes-second notification given    Camillo Flaming 07/05/2015, 11:45 AM

## 2015-07-05 NOTE — Progress Notes (Signed)
[facility] PROGRESS NOTE  Linda Golden QQV:[id number] DOB: 1926/12/29 DOA: 07/03/2015 PCP: [name], MD  Assessment/Plan: 1-Acute diverticulitis with 14 mm abscess;  -Abdominal pain has improved. Continue with clear diet.  -continue with zosyn for better coverage. Discussed with surgery.  -Monitor WBC and clinically. If patient get worse will consult surgery.  -pain better, advance diet to full liquid diet.   DVT prophylaxis:  Lovenox.   Hypertension -Monitor off of antihypertensives.  Acute on chronic kidney disease stage II -Baseline creatinine around 1.06. -Creatinine is 1.26 on admission. -hold ARB/diuretic. -NSL  Type 2 diabetes -Is diet controlled. -hemoglobin A1-c at 6.6 last month.  -check CBG.   UTI; UA with 21-50 WBC. Follow urine culture 100,000 E coli. . On zosyn.   Code Status: Full code.  Family Communication: Care discussed with patient.  Disposition Plan: Remain inpatient for treatment of diverticulitis.    Consultants:  none  Procedures:  none  Antibiotics:  Zosyn 9-18  HPI/Subjective: She is feeling better, pain 1/10. Had small BM   Objective: Filed Vitals:   07/05/15 0653  BP: 134/42  Pulse: 56  Temp: 98.5 F (36.9 C)  Resp: 16    Intake/Output Summary (Last 24 hours) at 07/05/15 1015 Last data filed at 07/05/15 0756  Gross per 24 hour  Intake   1945 ml  Output      0 ml  Net   1945 ml   Filed Weights   07/03/15 1144  Weight: 58.514 kg (129 lb)    Exam:   General:  NAD  Cardiovascular: S 1, S 2 RRR  Respiratory: CTA  Abdomen: BS present, soft, no tenderness  Musculoskeletal: no edema  Data Reviewed: Basic Metabolic Panel:  Recent Labs Lab 07/03/15 1210 07/04/15 0505 07/05/15 0449  NA 137 139 136  K 3.8 3.9 3.9  CL 101 105 102  CO2 26 26 27   GLUCOSE 145* 113* 117*  BUN 23* 21* 15  CREATININE 1.26* 1.13* 1.21*  CALCIUM 9.4 8.5* 8.7*   Liver Function Tests:  Recent Labs Lab  07/03/15 1210 07/04/15 0505  AST 20 17  ALT 21 14  ALKPHOS 68 52  BILITOT 1.0 0.9  PROT 8.2* 6.6  ALBUMIN 4.3 3.3*    Recent Labs Lab 07/03/15 1210  LIPASE 20*   No results for input(s): AMMONIA in the last 168 hours. CBC:  Recent Labs Lab 07/03/15 1210 07/04/15 0505 07/05/15 0449  WBC 10.8* 7.7 6.2  HGB 14.1 11.7* 12.2  HCT 41.8 35.4* 36.3  MCV 88.6 88.5 88.3  PLT 201 170 196   Cardiac Enzymes: No results for input(s): CKTOTAL, CKMB, CKMBINDEX, TROPONINI in the last 168 hours. BNP (last 3 results) No results for input(s): BNP in the last 8760 hours.  ProBNP (last 3 results) No results for input(s): PROBNP in the last 8760 hours.  CBG:  Recent Labs Lab 07/04/15 0723 07/04/15 1149 07/04/15 1656 07/04/15 2151 07/05/15 0734  GLUCAP 139* 111* 111* 127* 107*    Recent Results (from the past 240 hour(s))  Urine culture     Status: None (Preliminary result)   Collection Time: 07/03/15 12:15 PM  Result Value Ref Range Status   Specimen Description URINE, CLEAN CATCH  Final   Special Requests NONE  Final   Culture   Final    >=100,000 COLONIES/mL ESCHERICHIA COLI Performed at [facility]    Report Status PENDING  Incomplete     Studies: Ct Abdomen Pelvis W Contrast  07/03/2015  CLINICAL DATA:  Bilateral lower abdominal pain, more on the left side. History of diverticulitis. Nausea with no vomiting or diarrhea.  EXAM: CT ABDOMEN AND PELVIS WITH CONTRAST  TECHNIQUE: Multidetector CT imaging of the abdomen and pelvis was performed using the standard protocol following bolus administration of intravenous contrast.  CONTRAST:  151mL OMNIPAQUE IOHEXOL 300 MG/ML  SOLN  COMPARISON:  02/02/2010  FINDINGS: Lung bases: Chronic scarring atelectasis in the posterior inferior right middle lobe with mild scarring in the posterior base of the left upper lobe lingula. Heart mildly enlarged. There are coronary artery calcifications. These findings are stable.  Liver:   Fatty infiltration.  No mass or focal lesion.  Spleen, gallbladder, pancreas, adrenal glands:  Unremarkable.  Kidneys, ureters, bladder: 9 mm mixed density mass arises from the anterior midpole the right kidney. This is similar in size to what was in September 2008. The strongly supports a benign etiology. Tiny presumed cyst arises from the anterior lower pole of the right kidney. There is left renal cortical thinning. No other renal masses. No stones. No hydronephrosis. Ureters normal course and caliber. Bladder are unremarkable.  Uterus and adnexa: Prominence in the right uterine fundus is similar to the prior study likely a fibroid. Otherwise unremarkable.  Lymph nodes: There are prominent and mildly enlarged porta caval lymph nodes, largest measuring 12.6 mm in short axis, similar to the prior study. No other adenopathy.  Ascites:  None.  Gastrointestinal: There is inflammation of the mid sigmoid colon where there are numerous diverticula. A small fluid collection protrudes from the superior margin of the involved segment of sigmoid colon measuring 14 mm in size consistent with small very diverticular abscess. No extraluminal air. With no other evidence diverticulitis. Small bowel is unremarkable. Normal appendix is visualized. There are numerous other colonic diverted  Musculoskeletal: There are degenerative changes of the lumbar spine and arthropathic changes of both hips. No osteoblastic or osteolytic lesions.  IMPRESSION: 1. Diverticulitis of the mid sigmoid colon with a small, 14 mm cup very diverticular abscess. 2. No other acute findings.   Electronically Signed   By: Lajean Manes M.D.   On: 07/03/2015 17:14    Scheduled Meds: . aspirin EC  81 mg Oral Daily  . enoxaparin (LOVENOX) injection  30 mg Subcutaneous Q24H  . piperacillin-tazobactam (ZOSYN)  IV  3.375 g Intravenous Q8H   Continuous Infusions:    Principal Problem:   Diverticulitis of intestine with abscess Active Problems:    Diabetes mellitus, type II   Hyperlipidemia   Essential hypertension   CKD (chronic kidney disease), stage II   Renal failure (ARF), acute on chronic    Time spent: 35 minutes.     Niel Hummer A  [facility] Pager [phone]. If 7PM-7AM, please contact night-coverage at [email or web address], password [facility] 07/05/2015, 10:15 AM  LOS: 2 days

## 2015-07-06 DIAGNOSIS — I10 Essential (primary) hypertension: Secondary | ICD-10-CM

## 2015-07-06 LAB — CBC
HCT: 37.3 % (ref 36.0–46.0)
HEMOGLOBIN: 12.5 g/dL (ref 12.0–15.0)
MCH: 29.6 pg (ref 26.0–34.0)
MCHC: 33.5 g/dL (ref 30.0–36.0)
MCV: 88.2 fL (ref 78.0–100.0)
Platelets: 209 10*3/uL (ref 150–400)
RBC: 4.23 MIL/uL (ref 3.87–5.11)
RDW: 12 % (ref 11.5–15.5)
WBC: 7.1 10*3/uL (ref 4.0–10.5)

## 2015-07-06 LAB — GLUCOSE, CAPILLARY
GLUCOSE-CAPILLARY: 113 mg/dL — AB (ref 65–99)
GLUCOSE-CAPILLARY: 131 mg/dL — AB (ref 65–99)

## 2015-07-06 LAB — BASIC METABOLIC PANEL
Anion gap: 8 (ref 5–15)
BUN: 14 mg/dL (ref 6–20)
CALCIUM: 8.9 mg/dL (ref 8.9–10.3)
CO2: 27 mmol/L (ref 22–32)
CREATININE: 1.03 mg/dL — AB (ref 0.44–1.00)
Chloride: 103 mmol/L (ref 101–111)
GFR, EST AFRICAN AMERICAN: 55 mL/min — AB (ref >60)
GFR, EST NON AFRICAN AMERICAN: 48 mL/min — AB (ref >60)
Glucose, Bld: 117 mg/dL — ABNORMAL HIGH (ref 65–99)
Potassium: 3.9 mmol/L (ref 3.5–5.1)
SODIUM: 138 mmol/L (ref 135–145)

## 2015-07-06 MED ORDER — METRONIDAZOLE 500 MG PO TABS: 500.0000 mg | ORAL_TABLET | Freq: Three times a day (TID) | ORAL | Status: DC

## 2015-07-06 MED ORDER — AMOXICILLIN-POT CLAVULANATE 875-125 MG PO TABS: 1.0000 | ORAL_TABLET | Freq: Two times a day (BID) | ORAL | Status: DC

## 2015-07-06 MED ORDER — CIPROFLOXACIN HCL 500 MG PO TABS: 500.0000 mg | ORAL_TABLET | Freq: Two times a day (BID) | ORAL | Status: DC

## 2015-07-06 NOTE — Progress Notes (Signed)
Pt d/c home. Follow up appointment made for patient with Dr. Yong Channel for Tuesday the 27th at 2:15 pm. AVS reviewed and "My Chart" discussed with pt. Pt capable of verbalizing medications and follow-up appointments. Remains hemodynamically stable. No signs and symptoms of distress. Educated pt to return to ER in the case of SOB, dizziness, or chest pain.

## 2015-07-06 NOTE — Discharge Summary (Signed)
Physician Discharge Summary  Linda Golden QQV:[id number] DOB: [birth year] DOA: 07/03/2015  PCP: [name], MD  Admit date: 07/03/2015 Discharge date: 07/06/2015  Time spent: 35 minutes  Recommendations for Outpatient Follow-up:  Needs to follow up with PCP for resolution of diverticulitis Needs referral to GI.   Discharge Diagnoses:    Diverticulitis of intestine with abscess   Diabetes mellitus, type II   Hyperlipidemia   Essential hypertension   CKD (chronic kidney disease), stage II   Renal failure (ARF), acute on chronic   Discharge Condition: Stable.   Diet recommendation: heart Healthy.   Filed Weights   07/03/15 1144  Weight: 58.514 kg (129 lb)    History of present illness:  Patient is a very pleasant 81 year old woman with past medical history significant for diet-controlled diabetes, cholesterolemia not currently on medications, hypertension who presents to the hospital today with a three-day history of left lower quadrant pain, mild nausea but no vomiting, denies fevers or chills. Decided to come into the hospital today for evaluation at the urging of her son. Patient is very active and independent and still drives. On CT scan she was found to have diverticulitis of the mid sigmoid colon with a small 14 mm abscess, very mild leukocytosis of 10.8. We have been asked to admit her for further evaluation and management.  Hospital Course:  1-Acute diverticulitis with 14 mm abscess;  -Abdominal pain has resolved.  Tolerating bland diet -received 3 days of IV Zosyn.  -She will be discharge on Flagyl and Augmentin.  -E coli, in urine resistant to cipro.   DVT prophylaxis: Lovenox.   Hypertension -Monitor off of antihypertensives.  Acute on chronic kidney disease stage II -Baseline creatinine around 1.06. -Creatinine is 1.26 on admission. -hold ARB/diuretic. -NSL  Type 2 diabetes -Is diet controlled. -hemoglobin A1-c at 6.6 last month.  -check CBG.   UTI;  UA with 21-50 WBC. Follow urine culture 100,000 E coli. Resistant to cipro. Discharge on Augmentin received 3 days of antibiotic.s   Procedures:  none  Consultations:  none  Discharge Exam: Filed Vitals:   07/06/15 0518  BP: 127/47  Pulse: 52  Temp: 98.1 F (36.7 C)  Resp: 16    General: NAD Cardiovascular: S 1, S 2 RRR Respiratory: CTA  Discharge Instructions   Discharge Instructions    Diet - low sodium heart healthy    Complete by:  As directed      Increase activity slowly    Complete by:  As directed           Current Discharge Medication List    START taking these medications   Details  ciprofloxacin (CIPRO) 500 MG tablet Take 1 tablet (500 mg total) by mouth 2 (two) times daily. Qty: 14 tablet, Refills: 0    metroNIDAZOLE (FLAGYL) 500 MG tablet Take 1 tablet (500 mg total) by mouth 3 (three) times daily. Qty: 21 tablet, Refills: 0      CONTINUE these medications which have NOT CHANGED   Details  acetaminophen (TYLENOL) 500 MG tablet Take 500 mg by mouth every 6 (six) hours as needed for mild pain, moderate pain, fever or headache.    aspirin EC 81 MG tablet Take 81 mg by mouth daily.    Blood Pressure Monitoring (BLOOD PRESSURE CUFF) MISC Please provide 1 blood pressure cuff Qty: 1 each, Refills: 0    glucose blood (FREESTYLE LITE) test strip Test once daily. Dx E11.9 Qty: 100 each, Refills: 12    losartan-hydrochlorothiazide (  HYZAAR) 100-12.5 MG per tablet Take 1 tablet by mouth daily. Qty: 30 tablet, Refills: 5       Allergies  Allergen Reactions  . Lisinopril Cough  . Statins Other (See Comments)    Hair loss  . Sulfa Antibiotics Other (See Comments)    No reaction-patient states her family member was "burned" from this medication   Follow-up Information    Follow up with [name], MD In 1 week.   Specialty:  Family Medicine   Contact information:   [address] [place] [address] [phone]        The  results of significant diagnostics from this hospitalization (including imaging, microbiology, ancillary and laboratory) are listed below for reference.    Significant Diagnostic Studies: Ct Abdomen Pelvis W Contrast  07/03/2015   CLINICAL DATA:  Bilateral lower abdominal pain, more on the left side. History of diverticulitis. Nausea with no vomiting or diarrhea.  EXAM: CT ABDOMEN AND PELVIS WITH CONTRAST  TECHNIQUE: Multidetector CT imaging of the abdomen and pelvis was performed using the standard protocol following bolus administration of intravenous contrast.  CONTRAST:  166mL OMNIPAQUE IOHEXOL 300 MG/ML  SOLN  COMPARISON:  02/02/2010  FINDINGS: Lung bases: Chronic scarring atelectasis in the posterior inferior right middle lobe with mild scarring in the posterior base of the left upper lobe lingula. Heart mildly enlarged. There are coronary artery calcifications. These findings are stable.  Liver:  Fatty infiltration.  No mass or focal lesion.  Spleen, gallbladder, pancreas, adrenal glands:  Unremarkable.  Kidneys, ureters, bladder: 9 mm mixed density mass arises from the anterior midpole the right kidney. This is similar in size to what was in September 2008. The strongly supports a benign etiology. Tiny presumed cyst arises from the anterior lower pole of the right kidney. There is left renal cortical thinning. No other renal masses. No stones. No hydronephrosis. Ureters normal course and caliber. Bladder are unremarkable.  Uterus and adnexa: Prominence in the right uterine fundus is similar to the prior study likely a fibroid. Otherwise unremarkable.  Lymph nodes: There are prominent and mildly enlarged porta caval lymph nodes, largest measuring 12.6 mm in short axis, similar to the prior study. No other adenopathy.  Ascites:  None.  Gastrointestinal: There is inflammation of the mid sigmoid colon where there are numerous diverticula. A small fluid collection protrudes from the superior margin of the  involved segment of sigmoid colon measuring 14 mm in size consistent with small very diverticular abscess. No extraluminal air. With no other evidence diverticulitis. Small bowel is unremarkable. Normal appendix is visualized. There are numerous other colonic diverted  Musculoskeletal: There are degenerative changes of the lumbar spine and arthropathic changes of both hips. No osteoblastic or osteolytic lesions.  IMPRESSION: 1. Diverticulitis of the mid sigmoid colon with a small, 14 mm cup very diverticular abscess. 2. No other acute findings.   Electronically Signed   By: Lajean Manes M.D.   On: 07/03/2015 17:14    Microbiology: Recent Results (from the past 240 hour(s))  Urine culture     Status: None (Preliminary result)   Collection Time: 07/03/15 12:15 PM  Result Value Ref Range Status   Specimen Description URINE, CLEAN CATCH  Final   Special Requests NONE  Final   Culture   Final    >=100,000 COLONIES/mL ESCHERICHIA COLI CULTURE REINCUBATED FOR BETTER GROWTH Performed at [facility]    Report Status PENDING  Incomplete   Organism ID, Bacteria ESCHERICHIA COLI  Final      Susceptibility   Escherichia coli - MIC*    AMPICILLIN >=32 RESISTANT Resistant     CEFAZOLIN <=4 SENSITIVE Sensitive     CEFTRIAXONE <=1 SENSITIVE Sensitive     CIPROFLOXACIN >=4 RESISTANT Resistant     GENTAMICIN <=1 SENSITIVE Sensitive     IMIPENEM <=0.25 SENSITIVE Sensitive     NITROFURANTOIN <=16 SENSITIVE Sensitive     TRIMETH/SULFA <=20 SENSITIVE Sensitive     AMPICILLIN/SULBACTAM 16 INTERMEDIATE Intermediate     PIP/TAZO <=4 SENSITIVE Sensitive     * >=100,000 COLONIES/mL ESCHERICHIA COLI     Labs: Basic Metabolic Panel:  Recent Labs Lab 07/03/15 1210 07/04/15 0505 07/05/15 0449 07/06/15 0446  NA 137 139 136 138  K 3.8 3.9 3.9 3.9  CL 101 105 102 103  CO2 26 26 27 27   GLUCOSE 145* 113* 117* 117*  BUN 23* 21* 15 14  CREATININE 1.26* 1.13* 1.21* 1.03*  CALCIUM 9.4 8.5* 8.7* 8.9    Liver Function Tests:  Recent Labs Lab 07/03/15 1210 07/04/15 0505  AST 20 17  ALT 21 14  ALKPHOS 68 52  BILITOT 1.0 0.9  PROT 8.2* 6.6  ALBUMIN 4.3 3.3*    Recent Labs Lab 07/03/15 1210  LIPASE 20*   No results for input(s): AMMONIA in the last 168 hours. CBC:  Recent Labs Lab 07/03/15 1210 07/04/15 0505 07/05/15 0449 07/06/15 0446  WBC 10.8* 7.7 6.2 7.1  HGB 14.1 11.7* 12.2 12.5  HCT 41.8 35.4* 36.3 37.3  MCV 88.6 88.5 88.3 88.2  PLT 201 170 196 209   Cardiac Enzymes: No results for input(s): CKTOTAL, CKMB, CKMBINDEX, TROPONINI in the last 168 hours. BNP: BNP (last 3 results) No results for input(s): BNP in the last 8760 hours.  ProBNP (last 3 results) No results for input(s): PROBNP in the last 8760 hours.  CBG:  Recent Labs Lab 07/05/15 1243 07/05/15 1639 07/05/15 2113 07/06/15 0745 07/06/15 1148  GLUCAP 158* 104* 95 113* 131*       Signed:  Regalado, Belkys A  [facility] 07/06/2015, 12:19 PM

## 2015-07-07 LAB — URINE CULTURE: Culture: >=100000

## 2015-07-08 ENCOUNTER — Telehealth: Payer: Self-pay | Admitting: *Deleted

## 2015-07-08 NOTE — Telephone Encounter (Signed)
Attempted to make Transitional Care Management call. Left voicemail for patient to call back.

## 2015-07-13 ENCOUNTER — Encounter: Payer: Self-pay | Admitting: Family Medicine

## 2015-07-13 ENCOUNTER — Ambulatory Visit (INDEPENDENT_AMBULATORY_CARE_PROVIDER_SITE_OTHER): Payer: Medicare Other | Admitting: Family Medicine

## 2015-07-13 VITALS — BP 142/70 | HR 86 | Temp 97.7°F | Wt 125.0 lb

## 2015-07-13 DIAGNOSIS — K5700 Diverticulitis of small intestine with perforation and abscess without bleeding: Secondary | ICD-10-CM

## 2015-07-13 DIAGNOSIS — Z23 Encounter for immunization: Secondary | ICD-10-CM | POA: Diagnosis not present

## 2015-07-13 DIAGNOSIS — N182 Chronic kidney disease, stage 2 (mild): Secondary | ICD-10-CM | POA: Diagnosis not present

## 2015-07-13 DIAGNOSIS — N3000 Acute cystitis without hematuria: Secondary | ICD-10-CM | POA: Diagnosis not present

## 2015-07-13 NOTE — Patient Instructions (Addendum)
Flu shot received today.  Glad you are doing better!   You can slowly reintroduce foods to your diet now and step up from bland diet.   If you have recurrent abdominal pain, you need to return to care.   We made a joint decision not to pursue seeing the stomach doctors- likely they would just do CT scan which we can do from our office.   Finish antibiotics and see [place] back as needed  Health Maintenance Due  Topic Date Due  . OPHTHALMOLOGY EXAM - still need this 03/17/2015

## 2015-07-13 NOTE — Progress Notes (Signed)
[name], MD  Subjective:  Linda Golden is a 81 y.o. year old very pleasant female patient who presents with:  Hospital follow-up for diverticulitis with abscess -Patient was hospitalized from September 17 to September 20 of 2016. She presented with 3 days of left lower quadrant pain and mild nausea. She was without fever or chills. CT scan showed that she had diverticulitis of the mid sigmoid colon and also had a small 14 mm abscess. Labs showed a very mild leukocytosis of 10.8 thousand. She was treated with 3 days of IV Zosyn and transitioned to Augmentin and Flagyl. She had a UTI as well which was resistant to Cipro so that is why she is not discharged on Cipro and Flagyl. Augmentin essentially showed intermediate resistance Escherichia coli UTI was sensitive to Zosyn. Her hypertension and diabetes were stable in hospital.  Today patient states that all of her abdominal pain has resolved. She has just a days worth of Augmentin left. She is having some loose stools but continues to be on a bland soft diet and she asks if she can progress her diet. She has no UTI symptoms.  ROS- no abdominal pain. No fever chills. Mild loose stools. No chest pain shortness breath. No dysuria or polyuria  Past Medical History-  Patient Active Problem List   Diagnosis Date Noted  . Diabetes mellitus, type II 04/18/2007    Priority: High  . Hyperlipidemia 04/18/2007    Priority: Medium  . Essential hypertension 04/18/2007    Priority: Medium  . CKD (chronic kidney disease), stage II 10/23/2014    Priority: Low  . Raynaud's disease 10/08/2013    Priority: Low  . Osteoarthritis 07/01/2008    Priority: Low  . Varicose veins 11/04/2007    Priority: Low  . Osteopenia 04/18/2007    Priority: Low  . Diverticulitis of intestine with abscess 07/03/2015     Medications- reviewed and updated Current Outpatient Prescriptions  Medication Sig Dispense Refill  . amoxicillin-clavulanate (AUGMENTIN) 875-125  MG per tablet Take 1 tablet by mouth 2 (two) times daily. 14 tablet 0  . aspirin EC 81 MG tablet Take 81 mg by mouth daily.    . Blood Pressure Monitoring (BLOOD PRESSURE CUFF) MISC Please provide 1 blood pressure cuff 1 each 0  . glucose blood (FREESTYLE LITE) test strip Test once daily. Dx E11.9 100 each 12  . losartan-hydrochlorothiazide (HYZAAR) 100-12.5 MG per tablet Take 1 tablet by mouth daily. 30 tablet 5  . metroNIDAZOLE (FLAGYL) 500 MG tablet Take 1 tablet (500 mg total) by mouth 3 (three) times daily. 21 tablet 0  . acetaminophen (TYLENOL) 500 MG tablet Take 500 mg by mouth every 6 (six) hours as needed for mild pain, moderate pain, fever or headache.     No current facility-administered medications for this visit.    Objective: BP 142/70 mmHg  Pulse 86  Temp(Src) 97.7 F (36.5 C)  Wt 125 lb (56.7 kg) Gen: NAD, resting comfortably CV: RRR no murmurs rubs or gallops Lungs: CTAB no crackles, wheeze, rhonchi Abdomen: soft/nontender/nondistended/normal bowel sounds. No rebound or guarding.  Ext: no edema Skin: warm, dry Neuro: grossly normal, moves all extremities  Assessment/Plan:  Diverticulitis of intestine with abscess By exam and history, diverticulitis has resolved. There was mention of referring to GI in the discharge summary. Patient and I discussed this and we are not sure therapy added benefit to this. We discussed that she had recurrent issue we would get a repeat CT scan with contrast.  She is well aware of reasons to follow-up with me. She will finish her antibiotics course and follow-up with me when necessary  UTI Discovered during hospitalization. Patient asymptomatic. Given there is some question about whether she received adequate treatment, we will repeat urine culture today. I'm hopeful to 3 days of Zosyn covered infection   BP Readings from Last 3 Encounters:  07/13/15 142/70  07/06/15 127/47  05/24/15 140/82  Of note her blood pressure is mildly  elevated but she did not take her hydrochlorothiazide for she came in today as intended.  Return precautions advised.   Orders Placed This Encounter  Procedures  . Urine culture    [facility]  . Flu Vaccine [facility] 36+ mos IM

## 2015-07-13 NOTE — Assessment & Plan Note (Addendum)
By exam and history, diverticulitis has resolved. There was mention of referring to GI in the discharge summary. Patient and I discussed this and we are not sure therapy added benefit to this. We discussed that she had recurrent issue we would get a repeat CT scan with contrast. She is well aware of reasons to follow-up with me. She will finish her antibiotics course and follow-up with me when necessary  UTI Discovered during hospitalization. Patient asymptomatic. Given there is some question about whether she received adequate treatment, we will repeat urine culture today. I'm hopeful to 3 days of Zosyn covered infection

## 2015-07-15 LAB — URINE CULTURE
COLONY COUNT: NO GROWTH
ORGANISM ID, BACTERIA: NO GROWTH

## 2015-08-05 DIAGNOSIS — E119 Type 2 diabetes mellitus without complications: Secondary | ICD-10-CM | POA: Diagnosis not present

## 2015-08-05 LAB — HM DIABETES EYE EXAM

## 2015-08-06 DIAGNOSIS — L851 Acquired keratosis [keratoderma] palmaris et plantaris: Secondary | ICD-10-CM | POA: Diagnosis not present

## 2015-08-06 DIAGNOSIS — E1342 Other specified diabetes mellitus with diabetic polyneuropathy: Secondary | ICD-10-CM | POA: Diagnosis not present

## 2015-08-06 DIAGNOSIS — B351 Tinea unguium: Secondary | ICD-10-CM | POA: Diagnosis not present

## 2015-08-12 ENCOUNTER — Encounter: Payer: Self-pay | Admitting: Family Medicine

## 2015-09-28 ENCOUNTER — Ambulatory Visit (INDEPENDENT_AMBULATORY_CARE_PROVIDER_SITE_OTHER): Payer: Medicare Other | Admitting: Family Medicine

## 2015-09-28 ENCOUNTER — Encounter: Payer: Self-pay | Admitting: Family Medicine

## 2015-09-28 ENCOUNTER — Telehealth: Payer: Self-pay | Admitting: Family Medicine

## 2015-09-28 VITALS — BP 140/62 | Temp 98.8°F | Wt 127.0 lb

## 2015-09-28 DIAGNOSIS — K5732 Diverticulitis of large intestine without perforation or abscess without bleeding: Secondary | ICD-10-CM

## 2015-09-28 MED ORDER — CIPROFLOXACIN HCL 500 MG PO TABS: 500.0000 mg | ORAL_TABLET | Freq: Two times a day (BID) | ORAL | Status: DC

## 2015-09-28 MED ORDER — METRONIDAZOLE 500 MG PO TABS: 500.0000 mg | ORAL_TABLET | Freq: Three times a day (TID) | ORAL | Status: DC

## 2015-09-28 NOTE — Progress Notes (Signed)
[name], MD  Subjective:  Linda Golden is a 81 y.o. year old very pleasant female patient who presents for/with See problem oriented charting ROS- no fever, chills, vomiting. Some nausea. Some fatigue.   Past Medical History-  Patient Active Problem List   Diagnosis Date Noted  . Diabetes mellitus, type II ([place]) 04/18/2007    Priority: High  . Hyperlipidemia 04/18/2007    Priority: Medium  . Essential hypertension 04/18/2007    Priority: Medium  . CKD (chronic kidney disease), stage II 10/23/2014    Priority: Low  . Raynaud's disease 10/08/2013    Priority: Low  . Osteoarthritis 07/01/2008    Priority: Low  . Varicose veins 11/04/2007    Priority: Low  . Osteopenia 04/18/2007    Priority: Low  . Diverticulitis of intestine with abscess 07/03/2015    Medications- reviewed and updated Current Outpatient Prescriptions  Medication Sig Dispense Refill  . aspirin EC 81 MG tablet Take 81 mg by mouth daily.    . Blood Pressure Monitoring (BLOOD PRESSURE CUFF) MISC Please provide 1 blood pressure cuff 1 each 0  . glucose blood (FREESTYLE LITE) test strip Test once daily. Dx E11.9 100 each 12  . losartan-hydrochlorothiazide (HYZAAR) 100-12.5 MG per tablet Take 1 tablet by mouth daily. 30 tablet 5  . acetaminophen (TYLENOL) 500 MG tablet Take 500 mg by mouth every 6 (six) hours as needed for mild pain, moderate pain, fever or headache.     Objective: BP 140/62 mmHg  Temp(Src) 98.8 F (37.1 C)  Wt 127 lb (57.607 kg) Gen: NAD, resting comfortably CV: RRR no murmurs rubs or gallops Lungs: CTAB no crackles, wheeze, rhonchi Abdomen:  LLQ moderate pain to palpation most pronounced within about 5 cm of umbilicus. othewise soft/nondistended/normal bowel sounds. No rebound or guarding.  Ext: no edema Skin: warm, dry Neuro: grossly normal, moves all extremities  Assessment/Plan:  Diverticulitis S: history fo diverticulitis with abscess in September requiring 3 day hospital  stay. Patient is much more aware of symptoms since that time. Sunday felt poor but abd pain started yesterday in LLQ. She Increased water and reduced her other food intake. No urinary symptoms. Pain rated as mild to moderate LLQ aching. Seems to be worsening.  A/P: 42F with diverticulitis 2 months ago with similar symptoms today. Treat with cipro/flagyl. Get cbc diff and BMET. Depending on wbc elevation, consider outpatient CT given history abscess during last hospitalization. Follow up Monday morning of next week to see if extend to full 10 days treatment or simply do 7 days.   Strict Return precautions advised.   Orders Placed This Encounter  Procedures  . CBC with Differential/Platelet  . Basic metabolic panel    [place]    Meds ordered this encounter  Medications  . ciprofloxacin (CIPRO) 500 MG tablet    Sig: Take 1 tablet (500 mg total) by mouth 2 (two) times daily.    Dispense:  20 tablet    Refill:  0  . metroNIDAZOLE (FLAGYL) 500 MG tablet    Sig: Take 1 tablet (500 mg total) by mouth 3 (three) times daily.    Dispense:  30 tablet    Refill:  0

## 2015-09-28 NOTE — Telephone Encounter (Signed)
Pt states she will be here before the end of day.

## 2015-09-28 NOTE — Telephone Encounter (Signed)
Linda Golden- just tell patient to come today and we will work her in- may have to wait until end of the day but request she come ASAP in case we have no show or late patient so we can work her in

## 2015-09-28 NOTE — Telephone Encounter (Signed)
Patient Name: Linda Golden  DOB: 1926-12-17    Initial Comment Caller asking if dr will call in meds for Diverticulitis   Nurse Assessment  Nurse: Leilani Merl, RN, Heather Date/Time (Eastern Time): 09/28/2015 9:32:57 AM  Confirm and document reason for call. If symptomatic, describe symptoms. ---Caller states that she started with abd pain yesterday and she still has it today, she has had diverticulitis before.  Has the patient traveled out of the country within the last 30 days? ---Not Applicable  Does the patient have any new or worsening symptoms? ---Yes  Will a triage be completed? ---Yes  Related visit to physician within the last 2 weeks? ---No  Does the PT have any chronic conditions? (i.e. diabetes, asthma, etc.) ---Yes  List chronic conditions. ---diverticulitis, DM  Is this a behavioral health or substance abuse call? ---No     Guidelines    Guideline Title Affirmed Question Affirmed Notes  Abdominal Pain - Female [1] MILD-MODERATE pain AND [2] constant AND [3] present > 2 hours    Final Disposition User   See Physician within 4 Hours (or PCP triage) Standifer, RN, [place] states that there are no appts at the office, so she wants to have medication for diverticulitis called in to [facility].   Referrals  GO TO FACILITY REFUSED   Disagree/Comply: Disagree  Disagree/Comply Reason: Disagree with instructions

## 2015-09-28 NOTE — Patient Instructions (Signed)
Take both antibiotics until I see you back  Have front desk schedule you for 8 am on Monday morning of next week for follow up. We will decide if we need to do 10 days of treatment at that time.   If worsening pain after 24 hours on antibiotics or if you have fever above 101, need to return to care  Get labs- considering CT scan of your belly given your history of abscess

## 2015-09-29 LAB — BASIC METABOLIC PANEL
BUN: 27 mg/dL — ABNORMAL HIGH (ref 6–23)
CHLORIDE: 100 meq/L (ref 96–112)
CO2: 29 meq/L (ref 19–32)
Calcium: 9.7 mg/dL (ref 8.4–10.5)
Creatinine, Ser: 1.12 mg/dL (ref 0.40–1.20)
GFR: 48.96 mL/min — ABNORMAL LOW (ref >60.00)
GLUCOSE: 166 mg/dL — AB (ref 70–99)
POTASSIUM: 5.5 meq/L — AB (ref 3.5–5.1)
SODIUM: 139 meq/L (ref 135–145)

## 2015-09-29 LAB — CBC WITH DIFFERENTIAL/PLATELET
BASOS ABS: 0.2 10*3/uL — AB (ref 0.0–0.1)
Basophils Relative: 1.2 % (ref 0.0–3.0)
EOS PCT: 0.2 % (ref 0.0–5.0)
Eosinophils Absolute: 0 10*3/uL (ref 0.0–0.7)
HCT: 39.7 % (ref 36.0–46.0)
HEMOGLOBIN: 13.2 g/dL (ref 12.0–15.0)
LYMPHS ABS: 2.7 10*3/uL (ref 0.7–4.0)
LYMPHS PCT: 18.6 % (ref 12.0–46.0)
MCHC: 33.3 g/dL (ref 30.0–36.0)
MCV: 89.3 fl (ref 78.0–100.0)
MONOS PCT: 7.2 % (ref 3.0–12.0)
Monocytes Absolute: 1 10*3/uL (ref 0.1–1.0)
NEUTROS PCT: 72.8 % (ref 43.0–77.0)
Neutro Abs: 10.4 10*3/uL — ABNORMAL HIGH (ref 1.4–7.7)
Platelets: 213 10*3/uL (ref 150.0–400.0)
RBC: 4.45 Mil/uL (ref 3.87–5.11)
RDW: 13.1 % (ref 11.5–15.5)
WBC: 14.3 10*3/uL — ABNORMAL HIGH (ref 4.0–10.5)

## 2015-10-04 ENCOUNTER — Ambulatory Visit (INDEPENDENT_AMBULATORY_CARE_PROVIDER_SITE_OTHER): Payer: Medicare Other | Admitting: Family Medicine

## 2015-10-04 ENCOUNTER — Encounter: Payer: Self-pay | Admitting: Family Medicine

## 2015-10-04 VITALS — BP 122/64 | HR 79 | Temp 98.1°F | Wt 125.0 lb

## 2015-10-04 DIAGNOSIS — I10 Essential (primary) hypertension: Secondary | ICD-10-CM | POA: Diagnosis not present

## 2015-10-04 DIAGNOSIS — K5732 Diverticulitis of large intestine without perforation or abscess without bleeding: Secondary | ICD-10-CM | POA: Diagnosis not present

## 2015-10-04 NOTE — Patient Instructions (Signed)
At a minimum, finish antibiotics through today.  If you can - finish antibiotics through tomorrow  If worsening pain  or if you have fever above 101, need to return to care

## 2015-10-04 NOTE — Progress Notes (Signed)
[name], MD  Subjective:  Linda Golden is a 81 y.o. year old very pleasant female patient who presents for/with See problem oriented charting ROS- no fever, chills, vomiting. Nausea more pronounced. Some fatigue.   Past Medical History-  Patient Active Problem List   Diagnosis Date Noted  . Diabetes mellitus, type II ([place]) 04/18/2007    Priority: High  . Hyperlipidemia 04/18/2007    Priority: Medium  . Essential hypertension 04/18/2007    Priority: Medium  . CKD (chronic kidney disease), stage II 10/23/2014    Priority: Low  . Raynaud's disease 10/08/2013    Priority: Low  . Osteoarthritis 07/01/2008    Priority: Low  . Varicose veins 11/04/2007    Priority: Low  . Osteopenia 04/18/2007    Priority: Low  . Diverticulitis of intestine with abscess 07/03/2015    Medications- reviewed and updated Current Outpatient Prescriptions  Medication Sig Dispense Refill  . aspirin EC 81 MG tablet Take 81 mg by mouth daily.    . Blood Pressure Monitoring (BLOOD PRESSURE CUFF) MISC Please provide 1 blood pressure cuff 1 each 0  . ciprofloxacin (CIPRO) 500 MG tablet Take 1 tablet (500 mg total) by mouth 2 (two) times daily. 20 tablet 0  . glucose blood (FREESTYLE LITE) test strip Test once daily. Dx E11.9 100 each 12  . losartan-hydrochlorothiazide (HYZAAR) 100-12.5 MG per tablet Take 1 tablet by mouth daily. 30 tablet 5  . metroNIDAZOLE (FLAGYL) 500 MG tablet Take 1 tablet (500 mg total) by mouth 3 (three) times daily. 30 tablet 0  . acetaminophen (TYLENOL) 500 MG tablet Take 500 mg by mouth every 6 (six) hours as needed for mild pain, moderate pain, fever or headache. Reported on 10/04/2015     Objective: BP 122/64 mmHg  Pulse 79  Temp(Src) 98.1 F (36.7 C)  Wt 125 lb (56.7 kg) Gen: NAD, resting comfortably CV: RRR no murmurs rubs or gallops Lungs: CTAB no crackles, wheeze, rhonchi Abdomen:  Minimal LLQ moderate (as compared to visit 1 week ago).  soft/nondistended/normal  bowel sounds. No rebound or guarding.  Ext: no edema Skin: warm, dry, no rash Neuro: grossly normal, moves all extremities  Assessment/Plan:  Diverticulitis S: Follow up after treatment for diverticulitis 1 week ago. Patient was having moderate LLQ pain at that time. She was treated with cipro/flagyl over last week with today being day 7. She states her abdominal pain is drastically improved with occasional mild soreness only. The antibiotics when she takes them seem to cause her to be very nauseous though and she is hoping to stop taking them. No exertional nausea- only with meds.  Her fatigue level has improve.d still no urinary symptoms. She has been tolerating PO well.  A/P: 63F with diverticulitis (with prior episode 2 months ago with abscess). Did have WBC elevation previously and considered repeating today but patient is so well appearing and symptoms essentially gone. We are going to finish day 7 of antibiotics today and day 8 only if she can tolerate them. She will return to care if recurrence of symptoms.   Strict Return precautions advised.   Hypertension S: controlled. Better than usual likely due to less PO/lower salt intake over last week.   BP Readings from Last 3 Encounters:  10/04/15 122/64  09/28/15 140/62  07/13/15 142/70  A/P:Continue current meds:  Losartan-hctz. No lightheadedness or other side effects.

## 2015-10-15 DIAGNOSIS — E1342 Other specified diabetes mellitus with diabetic polyneuropathy: Secondary | ICD-10-CM | POA: Diagnosis not present

## 2015-10-15 DIAGNOSIS — L851 Acquired keratosis [keratoderma] palmaris et plantaris: Secondary | ICD-10-CM | POA: Diagnosis not present

## 2015-10-15 DIAGNOSIS — B351 Tinea unguium: Secondary | ICD-10-CM | POA: Diagnosis not present

## 2015-10-29 ENCOUNTER — Other Ambulatory Visit: Payer: Self-pay | Admitting: *Deleted

## 2015-10-29 MED ORDER — LOSARTAN POTASSIUM-HCTZ 100-12.5 MG PO TABS: 1.0000 | ORAL_TABLET | Freq: Every day | ORAL | Status: DC

## 2015-11-24 ENCOUNTER — Ambulatory Visit: Payer: Medicare Other | Admitting: Family Medicine

## 2015-12-24 ENCOUNTER — Encounter: Payer: Self-pay | Admitting: Family Medicine

## 2015-12-24 ENCOUNTER — Ambulatory Visit (INDEPENDENT_AMBULATORY_CARE_PROVIDER_SITE_OTHER): Payer: Medicare Other | Admitting: Family Medicine

## 2015-12-24 VITALS — BP 148/80 | HR 76 | Temp 97.8°F | Wt 127.0 lb

## 2015-12-24 DIAGNOSIS — E113499 Type 2 diabetes mellitus with severe nonproliferative diabetic retinopathy without macular edema, unspecified eye: Secondary | ICD-10-CM

## 2015-12-24 DIAGNOSIS — I10 Essential (primary) hypertension: Secondary | ICD-10-CM

## 2015-12-24 LAB — CBC
HCT: 37.9 % (ref 36.0–46.0)
Hemoglobin: 13 g/dL (ref 12.0–15.0)
MCHC: 34.4 g/dL (ref 30.0–36.0)
MCV: 86.3 fl (ref 78.0–100.0)
Platelets: 224 10*3/uL (ref 150.0–400.0)
RBC: 4.39 Mil/uL (ref 3.87–5.11)
RDW: 13.2 % (ref 11.5–15.5)
WBC: 7.3 10*3/uL (ref 4.0–10.5)

## 2015-12-24 LAB — BASIC METABOLIC PANEL
BUN: 31 mg/dL — AB (ref 6–23)
CALCIUM: 9.7 mg/dL (ref 8.4–10.5)
CO2: 28 mEq/L (ref 19–32)
Chloride: 104 mEq/L (ref 96–112)
Creatinine, Ser: 0.9 mg/dL (ref 0.40–1.20)
GFR: 62.98 mL/min (ref >60.00)
GLUCOSE: 125 mg/dL — AB (ref 70–99)
Potassium: 3.7 mEq/L (ref 3.5–5.1)
SODIUM: 140 meq/L (ref 135–145)

## 2015-12-24 LAB — HEMOGLOBIN A1C: Hgb A1c MFr Bld: 6.9 % — ABNORMAL HIGH (ref 4.6–6.5)

## 2015-12-24 NOTE — Assessment & Plan Note (Signed)
S: reasonably controlled. On no meds- diet controlled CBGs- 137 this morning. Has been running slightly higher than 110-130 last time when a1c was 6.6 Lab Results  Component Value Date   HGBA1C 6.6* 05/24/2015   HGBA1C 7.4* 10/23/2014   HGBA1C 7.2* 04/08/2014    A/P: check a1c today. A1c goal <8 due to age and living alone

## 2015-12-24 NOTE — Assessment & Plan Note (Addendum)
S: controlled. On Losartan 100mg -hctz 12.5mg  in Am previously but did not take medicine today. Does have polyuria at times BP Readings from Last 3 Encounters:  12/24/15 148/80  10/04/15 122/64  09/28/15 140/62  A/P:Continue current meds (elevations today due to forgetting medicine before coming today):  Considered taking off hctz but potassium really runs too high and unclear if amlodipine alone would control her symptoms. If she has dysuria or worsening polyuria- get urine culture

## 2015-12-24 NOTE — Patient Instructions (Signed)
Try to take blood pressure medicine before next visit  Update labs today  Overall things look pretty good (except for blood pressure which is likely due to not taking medications today)

## 2015-12-24 NOTE — Progress Notes (Signed)
[name], MD  Subjective:  Linda Golden is a 82 y.o. year old very pleasant female patient who presents for/with See problem oriented charting ROS- No chest pain or shortness of breath. No headache or blurry vision. No hypoglycemia.  Past Medical History-  Patient Active Problem List   Diagnosis Date Noted  . Diabetes mellitus, type II ([place]) 04/18/2007    Priority: High  . Hyperlipidemia 04/18/2007    Priority: Medium  . Essential hypertension 04/18/2007    Priority: Medium  . CKD (chronic kidney disease), stage II 10/23/2014    Priority: Low  . Raynaud's disease 10/08/2013    Priority: Low  . Osteoarthritis 07/01/2008    Priority: Low  . Varicose veins 11/04/2007    Priority: Low  . Osteopenia 04/18/2007    Priority: Low  . Diverticulitis of intestine with abscess 07/03/2015    Medications- reviewed and updated Current Outpatient Prescriptions  Medication Sig Dispense Refill  . aspirin EC 81 MG tablet Take 81 mg by mouth daily.    Marland Kitchen losartan-hydrochlorothiazide (HYZAAR) 100-12.5 MG tablet Take 1 tablet by mouth daily. 30 tablet 5  . acetaminophen (TYLENOL) 500 MG tablet Take 500 mg by mouth every 6 (six) hours as needed for mild pain, moderate pain, fever or headache. Reported on 12/24/2015     No current facility-administered medications for this visit.    Objective: BP 148/80 mmHg  Pulse 76  Temp(Src) 97.8 F (36.6 C)  Wt 127 lb (57.607 kg) Gen: NAD, resting comfortably CV: RRR no murmurs rubs or gallops Lungs: CTAB no crackles, wheeze, rhonchi Abdomen: soft/nontender/nondistended/normal bowel sounds. No rebound or guarding.  Ext: no edema Skin: warm, dry Neuro: grossly normal, moves all extremities  Assessment/Plan:  Diabetes mellitus, type II S: reasonably controlled. On no meds- diet controlled CBGs- 137 this morning. Has been running slightly higher than 110-130 last time when a1c was 6.6 Lab Results  Component Value Date   HGBA1C 6.6*  05/24/2015   HGBA1C 7.4* 10/23/2014   HGBA1C 7.2* 04/08/2014    A/P: check a1c today. A1c goal <8 due to age and living alone   Essential hypertension S: controlled. On Losartan 100mg -hctz 12.5mg  in Am previously but did not take medicine today. Does have polyuria at times BP Readings from Last 3 Encounters:  12/24/15 148/80  10/04/15 122/64  09/28/15 140/62  A/P:Continue current meds (elevations today due to forgetting medicine before coming today):  Considered taking off hctz but potassium really runs too high and unclear if amlodipine alone would control her symptoms. If she has dysuria or worsening polyuria- get urine culture    Return in about 6 months (around 06/25/2016) for follow up- or sooner if needed. Return precautions advised.   Orders Placed This Encounter  Procedures  . Basic Metabolic Panel  . CBC (no diff)  . Hemoglobin A1c

## 2015-12-31 ENCOUNTER — Other Ambulatory Visit: Payer: Self-pay | Admitting: Family Medicine

## 2015-12-31 DIAGNOSIS — B351 Tinea unguium: Secondary | ICD-10-CM | POA: Diagnosis not present

## 2015-12-31 DIAGNOSIS — L851 Acquired keratosis [keratoderma] palmaris et plantaris: Secondary | ICD-10-CM | POA: Diagnosis not present

## 2015-12-31 DIAGNOSIS — E1342 Other specified diabetes mellitus with diabetic polyneuropathy: Secondary | ICD-10-CM | POA: Diagnosis not present

## 2016-03-10 DIAGNOSIS — E1342 Other specified diabetes mellitus with diabetic polyneuropathy: Secondary | ICD-10-CM | POA: Diagnosis not present

## 2016-03-10 DIAGNOSIS — B351 Tinea unguium: Secondary | ICD-10-CM | POA: Diagnosis not present

## 2016-03-10 DIAGNOSIS — L851 Acquired keratosis [keratoderma] palmaris et plantaris: Secondary | ICD-10-CM | POA: Diagnosis not present

## 2016-03-30 DIAGNOSIS — D225 Melanocytic nevi of trunk: Secondary | ICD-10-CM | POA: Diagnosis not present

## 2016-03-30 DIAGNOSIS — S8992XA Unspecified injury of left lower leg, initial encounter: Secondary | ICD-10-CM | POA: Diagnosis not present

## 2016-04-03 ENCOUNTER — Encounter: Payer: Self-pay | Admitting: Family Medicine

## 2016-04-03 ENCOUNTER — Ambulatory Visit (INDEPENDENT_AMBULATORY_CARE_PROVIDER_SITE_OTHER): Payer: Medicare Other | Admitting: Family Medicine

## 2016-04-03 ENCOUNTER — Telehealth: Payer: Self-pay | Admitting: Family Medicine

## 2016-04-03 VITALS — BP 150/70 | HR 68 | Temp 98.4°F | Ht 63.0 in | Wt 124.0 lb

## 2016-04-03 DIAGNOSIS — K5732 Diverticulitis of large intestine without perforation or abscess without bleeding: Secondary | ICD-10-CM | POA: Diagnosis not present

## 2016-04-03 DIAGNOSIS — R1032 Left lower quadrant pain: Secondary | ICD-10-CM | POA: Diagnosis not present

## 2016-04-03 DIAGNOSIS — I10 Essential (primary) hypertension: Secondary | ICD-10-CM

## 2016-04-03 LAB — CBC WITH DIFFERENTIAL/PLATELET
Basophils Absolute: 0 10*3/uL (ref 0.0–0.1)
Basophils Relative: 0.3 % (ref 0.0–3.0)
EOS ABS: 0.1 10*3/uL (ref 0.0–0.7)
Eosinophils Relative: 0.7 % (ref 0.0–5.0)
HCT: 39.7 % (ref 36.0–46.0)
HEMOGLOBIN: 13.4 g/dL (ref 12.0–15.0)
Lymphocytes Relative: 20.3 % (ref 12.0–46.0)
Lymphs Abs: 2.4 10*3/uL (ref 0.7–4.0)
MCHC: 33.8 g/dL (ref 30.0–36.0)
MCV: 86.6 fl (ref 78.0–100.0)
MONO ABS: 0.9 10*3/uL (ref 0.1–1.0)
Monocytes Relative: 7.9 % (ref 3.0–12.0)
NEUTROS PCT: 70.8 % (ref 43.0–77.0)
Neutro Abs: 8.4 10*3/uL — ABNORMAL HIGH (ref 1.4–7.7)
Platelets: 231 10*3/uL (ref 150.0–400.0)
RBC: 4.58 Mil/uL (ref 3.87–5.11)
RDW: 12.7 % (ref 11.5–15.5)
WBC: 11.8 10*3/uL — AB (ref 4.0–10.5)

## 2016-04-03 LAB — COMPREHENSIVE METABOLIC PANEL
ALBUMIN: 4.2 g/dL (ref 3.5–5.2)
ALT: 12 U/L (ref 0–35)
AST: 13 U/L (ref 0–37)
Alkaline Phosphatase: 63 U/L (ref 39–117)
BUN: 26 mg/dL — AB (ref 6–23)
CHLORIDE: 102 meq/L (ref 96–112)
CO2: 25 mEq/L (ref 19–32)
CREATININE: 1.07 mg/dL (ref 0.40–1.20)
Calcium: 9.6 mg/dL (ref 8.4–10.5)
GFR: 51.55 mL/min — ABNORMAL LOW (ref >60.00)
GLUCOSE: 116 mg/dL — AB (ref 70–99)
Potassium: 3.8 mEq/L (ref 3.5–5.1)
SODIUM: 138 meq/L (ref 135–145)
TOTAL PROTEIN: 7.7 g/dL (ref 6.0–8.3)
Total Bilirubin: 0.8 mg/dL (ref 0.2–1.2)

## 2016-04-03 MED ORDER — CIPROFLOXACIN HCL 500 MG PO TABS
500.0000 mg | ORAL_TABLET | Freq: Two times a day (BID) | ORAL | Status: DC
Start: 2016-04-03 — End: 2016-06-28

## 2016-04-03 MED ORDER — METRONIDAZOLE 500 MG PO TABS: 500.0000 mg | ORAL_TABLET | Freq: Three times a day (TID) | ORAL | Status: DC

## 2016-04-03 NOTE — Telephone Encounter (Signed)
You can leave on schedule at 4:15 and just tell her to come now and we will get her in a room and try to work her into schedule this AM- come as soon as possible in case we have a gap between patients such as no show or late patient.

## 2016-04-03 NOTE — Telephone Encounter (Addendum)
Pt states she is having a diverticulitis flair up and would like to be seen this am, no appointments. Only thing available is 4:15.  pt does not want to wait that long, and does not feel comfortable driving that late in the day, Especially when they are calling for storms. If you cannot see pt this am, OK to work in with someone else?

## 2016-04-03 NOTE — Telephone Encounter (Signed)
Pt aware and will see you soon.

## 2016-04-03 NOTE — Progress Notes (Signed)
Subjective:  Linda Golden is a 82 y.o. year old very pleasant female patient who presents for/with See problem oriented charting ROS- see any ROS included in HPI as well.   Past Medical History-  Patient Active Problem List   Diagnosis Date Noted  . Diabetes mellitus, type II ([place]) 04/18/2007    Priority: High  . Hyperlipidemia 04/18/2007    Priority: Medium  . Essential hypertension 04/18/2007    Priority: Medium  . CKD (chronic kidney disease), stage II 10/23/2014    Priority: Low  . Raynaud's disease 10/08/2013    Priority: Low  . Osteoarthritis 07/01/2008    Priority: Low  . Varicose veins 11/04/2007    Priority: Low  . Osteopenia 04/18/2007    Priority: Low  . Diverticulitis of intestine with abscess 07/03/2015    Medications- reviewed and updated Current Outpatient Prescriptions  Medication Sig Dispense Refill  . acetaminophen (TYLENOL) 500 MG tablet Take 500 mg by mouth every 6 (six) hours as needed for mild pain, moderate pain, fever or headache. Reported on 12/24/2015    . aspirin EC 81 MG tablet Take 81 mg by mouth daily.    Marland Kitchen losartan-hydrochlorothiazide (HYZAAR) 100-12.5 MG tablet TAKE 1 TABLET BY MOUTH ONCE DAILY. 90 tablet 1  . mupirocin ointment (BACTROBAN) 2 %      No current facility-administered medications for this visit.    Objective: BP 150/70 mmHg  Pulse 68  Temp(Src) 98.4 F (36.9 C) (Oral)  Ht 5\' 3"  (1.6 m)  Wt 124 lb (56.246 kg)  BMI 21.97 kg/m2 Gen: NAD, resting comfortably Mucous membranes are moist. CV: RRR no murmurs rubs or gallops Lungs: CTAB no crackles, wheeze, rhonchi Abdomen: somewhat rigid from flexing abdomen, tender throughout lower abdomen but moderate t tenderness in LLQ. nondistended/normal bowel sounds. No rebound.  Ext: no edema Skin: warm, dry Neuro: grossly normal, moves all extremities  Assessment/Plan:   LLQ pain S: Patient with history of diverticulitis 06/2015 as well as 09/2015. Started with LLQ pain Sunday and  seemed to worseneed through the day. Gets periods of more intense pain up to 7/10. Feels like a cramping in her lower abdomen. Feels just like other epiosdes she has had. Also has some diffuse pain in belly. Tried tylenol and rest so far. Symptoms seem to be worsening is able to keep fluids down and has been working hard to stay hydrated  Ros- no fever, chills. Feels nauseous, low appetite- has not eaten anything- has drank some. No BM on Saturday then normal on Sunday but no blood in stool.   A/P: high suspicion for diverticulitis. New acute case- prior cases cleared. Will treat with cipro/flagyl. Will also check labs and urine culture given location. Doubt UTI as cause given severity but possible.   HTN- noted elevation but patient in acute pain- we will follow up when patient no longer in acute pain- previously controlled when on meds (12/24/15 had missed meds) BP Readings from Last 3 Encounters:  04/03/16 150/70  12/24/15 148/80  10/04/15 122/64   Phone updates on Thursday and monday  Meds ordered this encounter  . ciprofloxacin (CIPRO) 500 MG tablet    Sig: Take 1 tablet (500 mg total) by mouth 2 (two) times daily.    Dispense:  20 tablet    Refill:  0  . metroNIDAZOLE (FLAGYL) 500 MG tablet    Sig: Take 1 tablet (500 mg total) by mouth 3 (three) times daily.    Dispense:  30 tablet  Refill:  0    Return precautions advised.  [name], MD

## 2016-04-03 NOTE — Patient Instructions (Signed)
Labs before you leave including blood and urine  Suspect diverticulitis again- treat with cipro/flagyl combination. You seem to know your body pretty well with this. I want you to update me on Thursday and then next Monday to let me know how you are doing. If symptoms worsen- please seek care. We will plan on 10 days of treatment.   Blood pressure slightly high today but you are in pain so we will plan to just monitor for now.

## 2016-04-04 ENCOUNTER — Telehealth: Payer: Self-pay

## 2016-04-04 NOTE — Telephone Encounter (Signed)
-----   Message from Marin Olp, MD sent at 04/03/2016  6:29 PM EDT ----- WBC elevated- glad she got on antibiotics when she did. Appears bacterial by cell line breakdown which would go along with diverticulitis. Appears slightly dry- once again encourage her to work to remain hydrated even if doesn't feel like eating - bowel rest is reasonable

## 2016-04-06 ENCOUNTER — Ambulatory Visit (INDEPENDENT_AMBULATORY_CARE_PROVIDER_SITE_OTHER): Payer: Medicare Other | Admitting: Family Medicine

## 2016-04-06 ENCOUNTER — Telehealth: Payer: Self-pay | Admitting: Family Medicine

## 2016-04-06 ENCOUNTER — Other Ambulatory Visit: Payer: Self-pay

## 2016-04-06 DIAGNOSIS — N390 Urinary tract infection, site not specified: Secondary | ICD-10-CM

## 2016-04-06 LAB — URINE CULTURE

## 2016-04-06 MED ORDER — CEFTRIAXONE SODIUM 1 G IJ SOLR: 1.0000 g | Freq: Once | INTRAMUSCULAR | Status: DC

## 2016-04-06 MED ORDER — CEFTRIAXONE SODIUM 1 G IJ SOLR
1.0000 g | Freq: Once | INTRAMUSCULAR | Status: AC
  Administered 2016-04-06: 1 g via INTRAMUSCULAR

## 2016-04-06 NOTE — Telephone Encounter (Signed)
Spoke with patient and reassured her that she was given the injection here in the office. I called the pharmacy and cancelled the injection that was sent over in error. Cancelled with [place] at [facility].

## 2016-04-06 NOTE — Telephone Encounter (Signed)
Pt was not aware of having a injection Rx called in it was not told to her that she would have to give herself a injection.  Pt would like a call.

## 2016-04-17 ENCOUNTER — Ambulatory Visit (INDEPENDENT_AMBULATORY_CARE_PROVIDER_SITE_OTHER): Payer: Medicare Other | Admitting: Otolaryngology

## 2016-04-17 DIAGNOSIS — H903 Sensorineural hearing loss, bilateral: Secondary | ICD-10-CM

## 2016-04-17 DIAGNOSIS — H6121 Impacted cerumen, right ear: Secondary | ICD-10-CM | POA: Diagnosis not present

## 2016-04-20 DIAGNOSIS — L680 Hirsutism: Secondary | ICD-10-CM | POA: Diagnosis not present

## 2016-04-20 DIAGNOSIS — D225 Melanocytic nevi of trunk: Secondary | ICD-10-CM | POA: Diagnosis not present

## 2016-04-20 DIAGNOSIS — L570 Actinic keratosis: Secondary | ICD-10-CM | POA: Diagnosis not present

## 2016-04-20 DIAGNOSIS — Z1283 Encounter for screening for malignant neoplasm of skin: Secondary | ICD-10-CM | POA: Diagnosis not present

## 2016-04-20 DIAGNOSIS — X32XXXD Exposure to sunlight, subsequent encounter: Secondary | ICD-10-CM | POA: Diagnosis not present

## 2016-05-19 DIAGNOSIS — E1342 Other specified diabetes mellitus with diabetic polyneuropathy: Secondary | ICD-10-CM | POA: Diagnosis not present

## 2016-05-19 DIAGNOSIS — B351 Tinea unguium: Secondary | ICD-10-CM | POA: Diagnosis not present

## 2016-05-19 DIAGNOSIS — L851 Acquired keratosis [keratoderma] palmaris et plantaris: Secondary | ICD-10-CM | POA: Diagnosis not present

## 2016-05-25 DIAGNOSIS — H04123 Dry eye syndrome of bilateral lacrimal glands: Secondary | ICD-10-CM | POA: Diagnosis not present

## 2016-06-26 ENCOUNTER — Ambulatory Visit: Payer: Medicare Other | Admitting: Family Medicine

## 2016-06-28 ENCOUNTER — Ambulatory Visit (INDEPENDENT_AMBULATORY_CARE_PROVIDER_SITE_OTHER): Payer: Medicare Other | Admitting: Family Medicine

## 2016-06-28 ENCOUNTER — Encounter: Payer: Self-pay | Admitting: Family Medicine

## 2016-06-28 VITALS — BP 180/82 | HR 75 | Temp 97.9°F | Wt 125.6 lb

## 2016-06-28 DIAGNOSIS — I10 Essential (primary) hypertension: Secondary | ICD-10-CM | POA: Diagnosis not present

## 2016-06-28 DIAGNOSIS — E785 Hyperlipidemia, unspecified: Secondary | ICD-10-CM | POA: Diagnosis not present

## 2016-06-28 DIAGNOSIS — E119 Type 2 diabetes mellitus without complications: Secondary | ICD-10-CM

## 2016-06-28 LAB — BASIC METABOLIC PANEL
BUN: 23 mg/dL (ref 6–23)
CALCIUM: 9.6 mg/dL (ref 8.4–10.5)
CO2: 28 mEq/L (ref 19–32)
Chloride: 104 mEq/L (ref 96–112)
Creatinine, Ser: 0.9 mg/dL (ref 0.40–1.20)
GFR: 62.91 mL/min (ref >60.00)
GLUCOSE: 116 mg/dL — AB (ref 70–99)
Potassium: 4.3 mEq/L (ref 3.5–5.1)
SODIUM: 140 meq/L (ref 135–145)

## 2016-06-28 LAB — CBC
HEMATOCRIT: 39.6 % (ref 36.0–46.0)
Hemoglobin: 13.4 g/dL (ref 12.0–15.0)
MCHC: 33.9 g/dL (ref 30.0–36.0)
MCV: 87.1 fl (ref 78.0–100.0)
Platelets: 221 10*3/uL (ref 150.0–400.0)
RBC: 4.54 Mil/uL (ref 3.87–5.11)
RDW: 12.8 % (ref 11.5–15.5)
WBC: 7.8 10*3/uL (ref 4.0–10.5)

## 2016-06-28 LAB — HEMOGLOBIN A1C: HEMOGLOBIN A1C: 6.8 % — AB (ref 4.6–6.5)

## 2016-06-28 LAB — LDL CHOLESTEROL, DIRECT: LDL DIRECT: 163 mg/dL

## 2016-06-28 MED ORDER — LOSARTAN POTASSIUM-HCTZ 100-25 MG PO TABS: 1.0000 | ORAL_TABLET | Freq: Every day | ORAL | 3 refills | Status: DC

## 2016-06-28 MED ORDER — TETANUS-DIPHTH-ACELL PERTUSSIS 5-2.5-18.5 LF-MCG/0.5 IM SUSP: 0.5000 mL | Freq: Once | INTRAMUSCULAR | 0 refills | Status: AC

## 2016-06-28 NOTE — Assessment & Plan Note (Addendum)
S: poorly controlled on losartan 100-HCT 12.5mg  in AM. Home #s in 140s BP Readings from Last 3 Encounters:  06/28/16 (!) 180/82  04/03/16 (!) 150/70  12/24/15 (!) 148/80  A/P:Continue current meds:  But titrate hctz dose to 25mg . 1 week follow up. No red flags but will get labs as well- next step would be amlodipine. Discussed with diabetes would prefer closer or under 140 though tough balance given she is 22

## 2016-06-28 NOTE — Progress Notes (Signed)
Subjective:  Linda Golden is a 82 y.o. year old very pleasant female patient who presents for/with See problem oriented charting ROS- no hypoglycemia. No chest pain or shortness of breath. No headache or blurry vision..see any ROS included in HPI as well.   Past Medical History-  Patient Active Problem List   Diagnosis Date Noted  . Diabetes mellitus type II, controlled, with no complications ([place]) 0000000    Priority: High  . Hyperlipidemia 04/18/2007    Priority: Medium  . Essential hypertension 04/18/2007    Priority: Medium  . CKD (chronic kidney disease), stage II 10/23/2014    Priority: Low  . Raynaud's disease 10/08/2013    Priority: Low  . Osteoarthritis 07/01/2008    Priority: Low  . Varicose veins 11/04/2007    Priority: Low  . Osteopenia 04/18/2007    Priority: Low  . Diverticulitis of intestine with abscess 07/03/2015    Medications- reviewed and updated Current Outpatient Prescriptions  Medication Sig Dispense Refill  . acetaminophen (TYLENOL) 500 MG tablet Take 500 mg by mouth every 6 (six) hours as needed for mild pain, moderate pain, fever or headache. Reported on 12/24/2015    . aspirin EC 81 MG tablet Take 81 mg by mouth daily.    Marland Kitchen losartan-hydrochlorothiazide (HYZAAR) 100-12.5 MG tablet TAKE 1 TABLET BY MOUTH ONCE DAILY. 90 tablet 1  . metroNIDAZOLE (FLAGYL) 500 MG tablet Take 1 tablet (500 mg total) by mouth 3 (three) times daily. 30 tablet 0  . mupirocin ointment (BACTROBAN) 2 %      No current facility-administered medications for this visit.     Objective: BP (!) 180/82 (BP Location: Left Arm, Patient Position: Sitting, Cuff Size: Large)   Pulse 75   Temp 97.9 F (36.6 C) (Oral)   Wt 125 lb 9.6 oz (57 kg)   SpO2 98%   BMI 22.25 kg/m  Gen: NAD, resting comfortable, somewhat anxious appearing CV: RRR no murmurs rubs or gallops Lungs: CTAB no crackles, wheeze, rhonchi Abdomen: soft/nontender/nondistended/normal bowel sounds.  Ext: no  edema Skin: warm, dry Neuro: grossly normal, moves all extremities  Assessment/Plan:   Essential hypertension S: poorly controlled on losartan 100-HCT 12.5mg  in AM. Home #s in 140s BP Readings from Last 3 Encounters:  06/28/16 (!) 180/82  04/03/16 (!) 150/70  12/24/15 (!) 148/80  A/P:Continue current meds:  But titrate hctz dose to 25mg . 1 week follow up. No red flags but will get labs as well- next step would be amlodipine. Discussed with diabetes would prefer closer or under 140 though tough balance given she is 89  Hyperlipidemia S:LDL near 190. Hair loss on statins and simply does not tolerate. Fortunately no heart attack or stroke A/P: update direct ldl. We discussed pcsk9 inhibitors but patient is not interested in that option for primary prevention even if LDL over 190.    Diabetes mellitus type II, controlled, with no complications ([place]) S: well  controlled. On no meds on last visit. a1 goal <8 due to age and living alone CBGs- meter broke last month Lab Results  Component Value Date   HGBA1C 6.9 (H) 12/24/2015   HGBA1C 6.6 (H) 05/24/2015   HGBA1C 7.4 (H) 10/23/2014   A/P: she is worried about uptick as she could not check sugars- check today but likely no meds as long as a1c 8 or less though could consider low dose metformin   1 week  Return precautions advised.  [name], MD

## 2016-06-28 NOTE — Progress Notes (Signed)
Pre visit review using our clinic review tool, if applicable. No additional management support is needed unless otherwise documented below in the visit note. 

## 2016-06-28 NOTE — Assessment & Plan Note (Signed)
S: well  controlled. On no meds on last visit. a1 goal <8 due to age and living alone CBGs- meter broke last month Lab Results  Component Value Date   HGBA1C 6.9 (H) 12/24/2015   HGBA1C 6.6 (H) 05/24/2015   HGBA1C 7.4 (H) 10/23/2014   A/P: she is worried about uptick as she could not check sugars- check today but likely no meds as long as a1c 8 or less though could consider low dose metformin

## 2016-06-28 NOTE — Patient Instructions (Addendum)
Blood pressure high today. Likely somewhat related to stressors on way here.   Increase losartan-hctz from 100-12.5mg  dose to 100-25mg  dose. I sent this in to your pharmacy  I also sent in tetanus shot to your pharmacy  Labs before you leave  See me back in 1 week for recheck blood pressure, Wednesday morning would be fine

## 2016-06-28 NOTE — Assessment & Plan Note (Signed)
S:LDL near 190. Hair loss on statins and simply does not tolerate. Fortunately no heart attack or stroke A/P: update direct ldl. We discussed pcsk9 inhibitors but patient is not interested in that option for primary prevention even if LDL over 190.

## 2016-07-05 ENCOUNTER — Encounter: Payer: Self-pay | Admitting: Family Medicine

## 2016-07-05 ENCOUNTER — Ambulatory Visit (INDEPENDENT_AMBULATORY_CARE_PROVIDER_SITE_OTHER): Payer: Medicare Other | Admitting: Family Medicine

## 2016-07-05 DIAGNOSIS — I10 Essential (primary) hypertension: Secondary | ICD-10-CM

## 2016-07-05 MED ORDER — AMLODIPINE BESYLATE 2.5 MG PO TABS: 2.5000 mg | ORAL_TABLET | Freq: Every day | ORAL | 5 refills | Status: DC

## 2016-07-05 NOTE — Assessment & Plan Note (Signed)
S: improved ontrol with hctz up to 25mg  and losartan continued at 100mg . Home #s had been in 140s now- in low 140s BP Readings from Last 3 Encounters:  07/05/16 (!) 166/80  06/28/16 (!) 180/82  04/03/16 (!) 150/70  A/P:Continue current meds:  But add amlodipine 2.5 mg. Wrote as 1-2 pill sas may titrate up next week

## 2016-07-05 NOTE — Progress Notes (Signed)
Subjective:  Linda Golden is a 82 y.o. year old very pleasant female patient who presents for/with See problem oriented charting ROS- No chest pain or shortness of breath. No headache or blurry vision.see any ROS included in HPI as well.   Past Medical History-  Patient Active Problem List   Diagnosis Date Noted  . Diabetes mellitus type II, controlled, with no complications ([place]) 0000000    Priority: High  . CKD (chronic kidney disease), stage II 10/23/2014    Priority: Medium  . Hyperlipidemia 04/18/2007    Priority: Medium  . Essential hypertension 04/18/2007    Priority: Medium  . Raynaud's disease 10/08/2013    Priority: Low  . Osteoarthritis 07/01/2008    Priority: Low  . Varicose veins 11/04/2007    Priority: Low  . Osteopenia 04/18/2007    Priority: Low  . Diverticulitis of intestine with abscess 07/03/2015    Medications- reviewed and updated Current Outpatient Prescriptions  Medication Sig Dispense Refill  . acetaminophen (TYLENOL) 500 MG tablet Take 500 mg by mouth every 6 (six) hours as needed for mild pain, moderate pain, fever or headache. Reported on 12/24/2015    . aspirin EC 81 MG tablet Take 81 mg by mouth daily.    Marland Kitchen losartan-hydrochlorothiazide (HYZAAR) 100-25 MG tablet Take 1 tablet by mouth daily. 90 tablet 3   No current facility-administered medications for this visit.     Objective: BP (!) 166/80 (BP Location: Left Arm, Patient Position: Sitting, Cuff Size: Normal)   Pulse 72   Temp 98.2 F (36.8 C) (Oral)   Wt 124 lb 6.4 oz (56.4 kg)   SpO2 96%   BMI 22.04 kg/m  Gen: NAD, resting comfortably CV: RRR no murmurs rubs or gallops Lungs: CTAB no crackles, wheeze, rhonchi Ext: no edema On left arm near antecubital fossa there is a mobile 4 x 4 cm soft lesion with central area of mild erythema and scale.  Skin: warm, dry Neuro: grossly normal, moves all extremities  Assessment/Plan:  Essential hypertension S: improved ontrol with hctz up  to 25mg  and losartan continued at 100mg . Home #s had been in 140s now- in low 140s BP Readings from Last 3 Encounters:  07/05/16 (!) 166/80  06/28/16 (!) 180/82  04/03/16 (!) 150/70  A/P:Continue current meds:  But add amlodipine 2.5 mg. Wrote as 1-2 pill sas may titrate up next week  Patient also mentions lesion on left arm- states the central portion had light amount of pus and she has been using mupirocin and improving. Underlying it is what appears to be a lipoma by texture/constistency- I do not think this represents abscess. She tried to see dermatology but couldn't get in. We opted to continue present treatment and recheck when see back for BP in 1 week. She has only noticed both for week but could have been present. Without any timecourse I would have thought this was an AK on top of lipoma which very well could be.   1 week  Meds ordered this encounter  Medications  . amLODipine (NORVASC) 2.5 MG tablet    Sig: Take 1-2 tablets (2.5-5 mg total) by mouth daily.    Dispense:  60 tablet    Refill:  5    Return precautions advised.  [name], MD

## 2016-07-05 NOTE — Progress Notes (Signed)
Pre visit review using our clinic review tool, if applicable. No additional management support is needed unless otherwise documented below in the visit note. 

## 2016-07-05 NOTE — Patient Instructions (Addendum)
Continue losartan-hctz 100-25mg . Add amlodipine 2.5mg  in morning OR evening- either is fine.   Follow up next Wednesday morning (ok to use acute slot for check out desk)  Keep using topical antibiotic on arm. We will recheck this next week- if you see pus again or you get a fever, expanding redness or worsening pain- see [place] sooner

## 2016-07-07 ENCOUNTER — Ambulatory Visit: Payer: Medicare Other | Admitting: Family Medicine

## 2016-07-12 ENCOUNTER — Ambulatory Visit (INDEPENDENT_AMBULATORY_CARE_PROVIDER_SITE_OTHER): Payer: Medicare Other | Admitting: Family Medicine

## 2016-07-12 ENCOUNTER — Encounter: Payer: Self-pay | Admitting: Family Medicine

## 2016-07-12 DIAGNOSIS — I10 Essential (primary) hypertension: Secondary | ICD-10-CM | POA: Diagnosis not present

## 2016-07-12 NOTE — Progress Notes (Signed)
Pre visit review using our clinic review tool, if applicable. No additional management support is needed unless otherwise documented below in the visit note. 

## 2016-07-12 NOTE — Assessment & Plan Note (Signed)
S: mild poorly controlled On losartan 100mg , hctz 25mg , amlodipine 2.5 mg. Home #s Mainly 130s at home vs. Some 140s. Patient feels more fatigued with amlodipine but denies swelling BP Readings from Last 3 Encounters:  07/12/16 (!) 146/62. Home cuff 150/72.    07/12/16 (!) 174/68. Home cuff reads 151/72  07/05/16 (!) 166/80  06/28/16 (!) 180/82  A/P:Continue current meds:  Patient states does not think she can tolerate higher dose of amlodipine with her fatigue. We discussed at least beelow 150 in office on each check and I did in fact get some #s in 130s which is similar to home readings. Home cuff reasonable but is wrist cuff. Although DM goal would be <140/70 we opted not to uptitrate medication unless over 150/90 at home.

## 2016-07-12 NOTE — Patient Instructions (Signed)
Blood pressure looks much better with most home #s in 130s and in office in 130s or 140s. We discussed increasing dose to 5mg  but due to your fatigue we opted to keep youat 2.5mg . If you see numbers above 150 at home- call me and likely will increase to 5mg  (2 pills of the 2.5mg )

## 2016-07-12 NOTE — Progress Notes (Signed)
Subjective:  Linda Golden is a 82 y.o. year old very pleasant female patient who presents for/with See problem oriented charting ROS- No chest pain or shortness of breath. No headache or blurry vision.see any ROS included in HPI as well.   Past Medical History-  Patient Active Problem List   Diagnosis Date Noted  . Diabetes mellitus type II, controlled, with no complications ([place]) 0000000    Priority: High  . CKD (chronic kidney disease), stage II 10/23/2014    Priority: Medium  . Hyperlipidemia 04/18/2007    Priority: Medium  . Essential hypertension 04/18/2007    Priority: Medium  . Raynaud's disease 10/08/2013    Priority: Low  . Osteoarthritis 07/01/2008    Priority: Low  . Varicose veins 11/04/2007    Priority: Low  . Osteopenia 04/18/2007    Priority: Low  . Diverticulitis of intestine with abscess 07/03/2015    Medications- reviewed and updated Current Outpatient Prescriptions  Medication Sig Dispense Refill  . acetaminophen (TYLENOL) 500 MG tablet Take 500 mg by mouth every 6 (six) hours as needed for mild pain, moderate pain, fever or headache. Reported on 12/24/2015    . amLODipine (NORVASC) 2.5 MG tablet Take 1-2 tablets (2.5-5 mg total) by mouth daily. 60 tablet 5  . aspirin EC 81 MG tablet Take 81 mg by mouth daily.    Marland Kitchen losartan-hydrochlorothiazide (HYZAAR) 100-25 MG tablet Take 1 tablet by mouth daily. 90 tablet 3   No current facility-administered medications for this visit.     Objective: BP (!) 146/62   Pulse 74   Temp 97.7 F (36.5 C) (Oral)   Wt 128 lb 9.6 oz (58.3 kg)   SpO2 97%   BMI 22.78 kg/m  Gen: NAD, resting comfortably CV: RRR no murmurs rubs or gallops Lungs: CTAB no crackles, wheeze, rhonchi Ext: no edema Skin: warm, dry  Assessment/Plan:  Essential hypertension S: mild poorly controlled On losartan 100mg , hctz 25mg , amlodipine 2.5 mg. Home #s Mainly 130s at home vs. Some 140s. Patient feels more fatigued with amlodipine but  denies swelling BP Readings from Last 3 Encounters:  07/12/16 (!) 146/62. Home cuff 150/72.    07/12/16 (!) 174/68. Home cuff reads 151/72  07/05/16 (!) 166/80  06/28/16 (!) 180/82  A/P:Continue current meds:  Patient states does not think she can tolerate higher dose of amlodipine with her fatigue. We discussed at least beelow 150 in office on each check and I did in fact get some #s in 130s which is similar to home readings. Home cuff reasonable but is wrist cuff. Although DM goal would be <140/70 we opted not to uptitrate medication unless over 150/90 at home.   Lesion on left arm improving without signs of infection and no longer has potential AK look  Declines flu shot, agrees to 3 month follow up.   The duration of face-to-face time during this visit was greater than 15 minutes. Greater than 50% of this time was spent in counseling about BP goals and why her goal is tighter than for many her age, discussing side effects and considering titrating dose up.    Return precautions advised.  [name], MD

## 2016-08-02 DIAGNOSIS — H524 Presbyopia: Secondary | ICD-10-CM | POA: Diagnosis not present

## 2016-08-02 DIAGNOSIS — H52223 Regular astigmatism, bilateral: Secondary | ICD-10-CM | POA: Diagnosis not present

## 2016-08-02 DIAGNOSIS — H5201 Hypermetropia, right eye: Secondary | ICD-10-CM | POA: Diagnosis not present

## 2016-08-02 DIAGNOSIS — E119 Type 2 diabetes mellitus without complications: Secondary | ICD-10-CM | POA: Diagnosis not present

## 2016-08-02 LAB — HM DIABETES EYE EXAM

## 2016-08-04 DIAGNOSIS — E1342 Other specified diabetes mellitus with diabetic polyneuropathy: Secondary | ICD-10-CM | POA: Diagnosis not present

## 2016-08-04 DIAGNOSIS — L851 Acquired keratosis [keratoderma] palmaris et plantaris: Secondary | ICD-10-CM | POA: Diagnosis not present

## 2016-08-04 DIAGNOSIS — B351 Tinea unguium: Secondary | ICD-10-CM | POA: Diagnosis not present

## 2016-08-10 ENCOUNTER — Encounter: Payer: Self-pay | Admitting: Family Medicine

## 2016-08-18 ENCOUNTER — Ambulatory Visit (INDEPENDENT_AMBULATORY_CARE_PROVIDER_SITE_OTHER): Payer: Medicare Other

## 2016-08-18 DIAGNOSIS — Z23 Encounter for immunization: Secondary | ICD-10-CM | POA: Diagnosis not present

## 2016-09-16 IMAGING — CT CT ABD-PELV W/ CM
2 of 5 series · 16 of 46 positions shown, 18 images · IV contrast (OMNIPAQUE)
Comparison: 02/02/2010

CLINICAL DATA: Bilateral lower abdominal pain, more on the left
side. History of diverticulitis. Nausea with no vomiting or
diarrhea.

EXAM:
CT ABDOMEN AND PELVIS WITH CONTRAST
TECHNIQUE: Multidetector CT imaging of the abdomen and pelvis was performed
using the standard protocol following bolus administration of
intravenous contrast.
CONTRAST:  100mL OMNIPAQUE IOHEXOL 300 MG/ML  SOLN

[Series 2: rtn a/p with · axial · 0.74mm/px · z∈[-343,+27]mm · 13 of 84 slices shown, 15 images]
[im 5/84  soft-tissue]
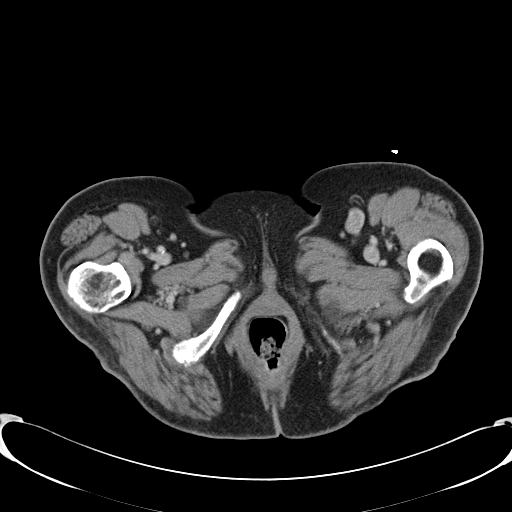
[im 5/84  bone]
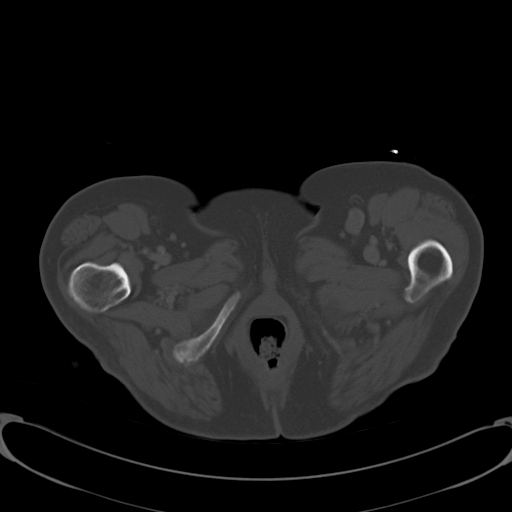
[im 10/84  soft-tissue]
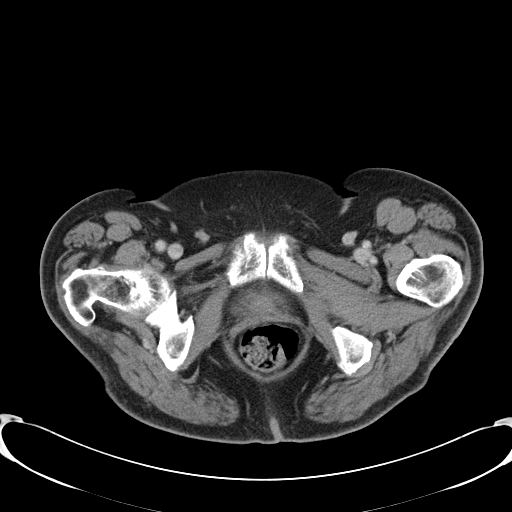
[im 19/84  soft-tissue]
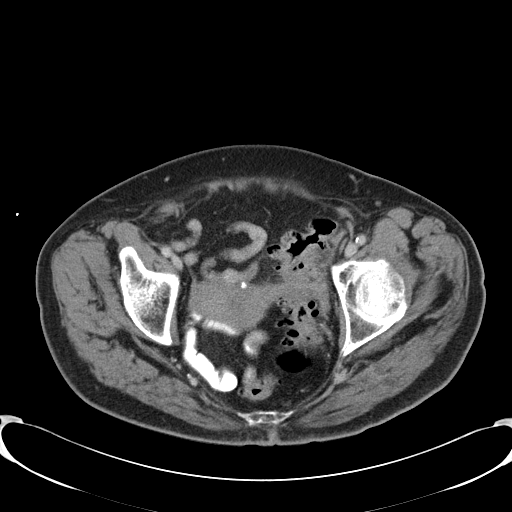
[im 24/84  soft-tissue]
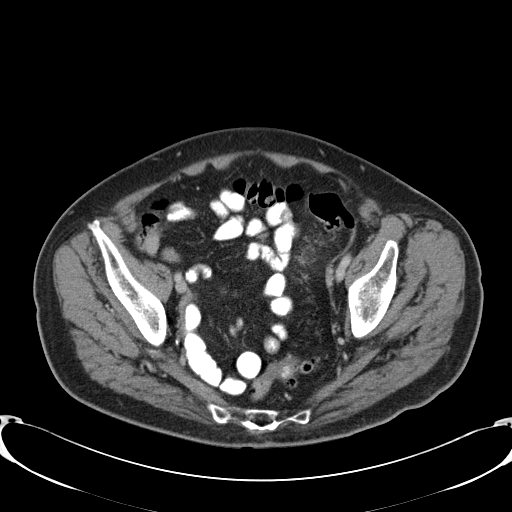
[im 28/84  soft-tissue]
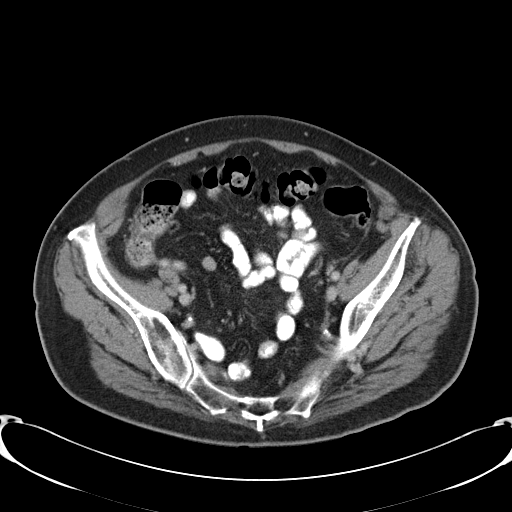
[im 37/84  soft-tissue]
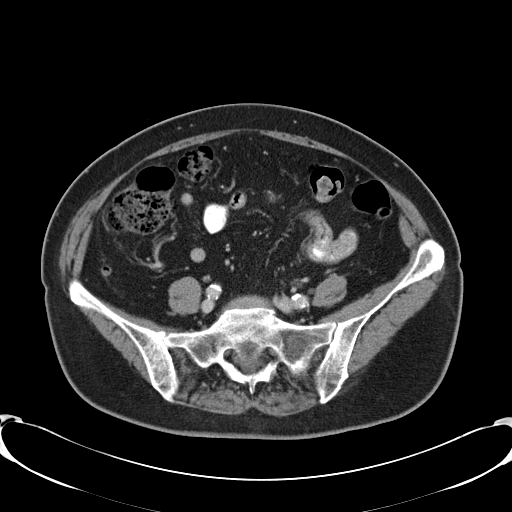
[im 42/84  soft-tissue]
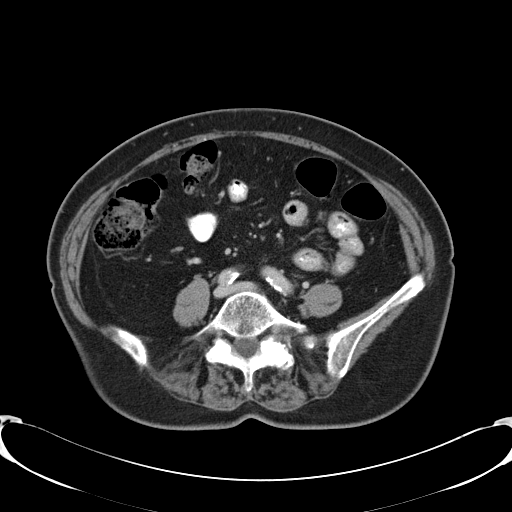
[im 47/84  soft-tissue]
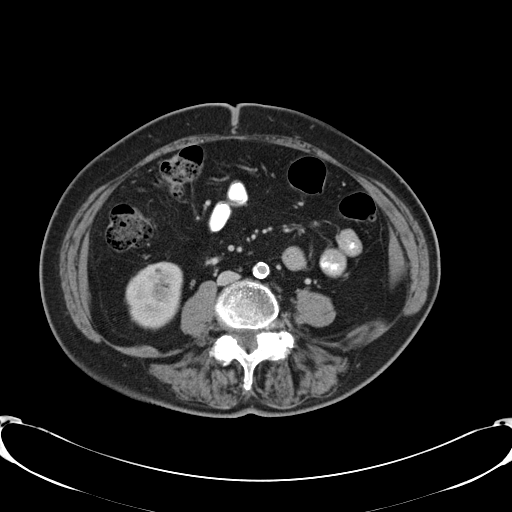
[im 56/84  soft-tissue]
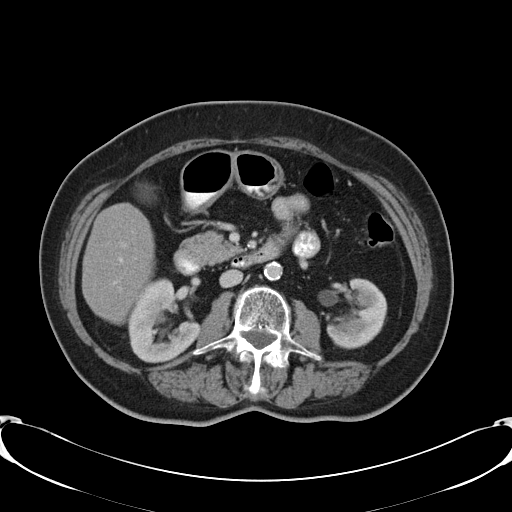
[im 56/84  bone]
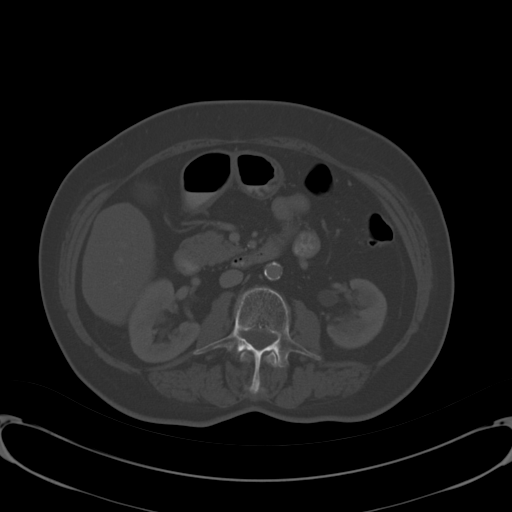
[im 60/84  soft-tissue]
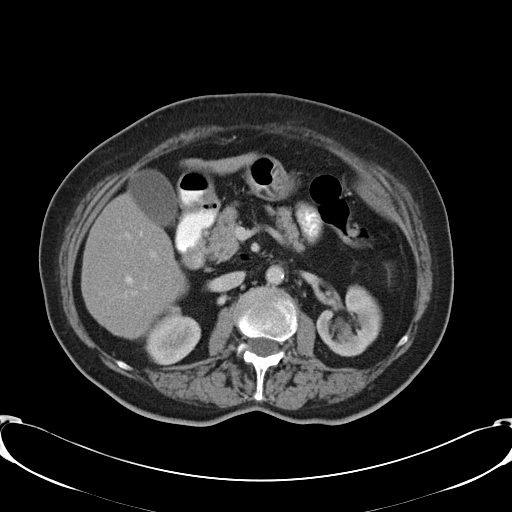
[im 65/84  soft-tissue]
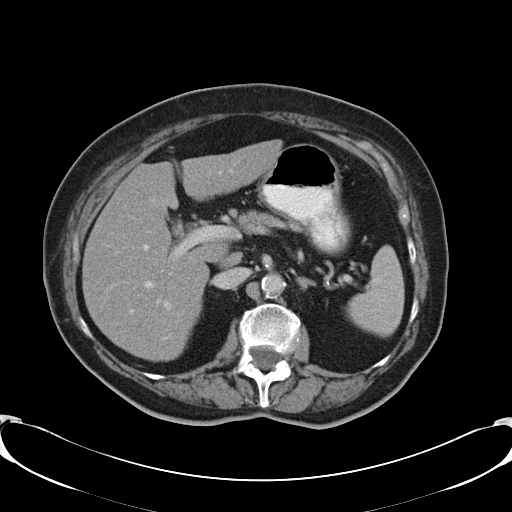
[im 74/84  soft-tissue]
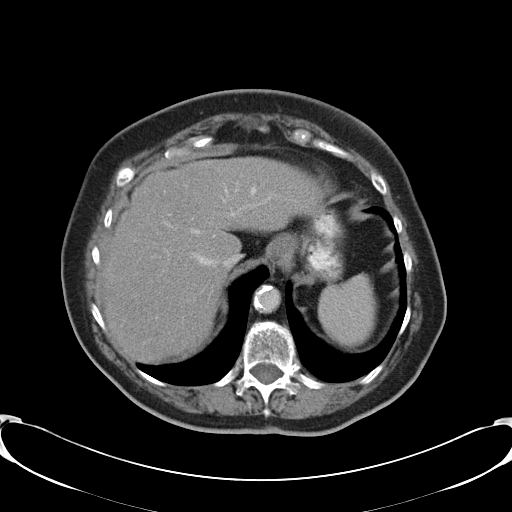
[im 79/84  soft-tissue]
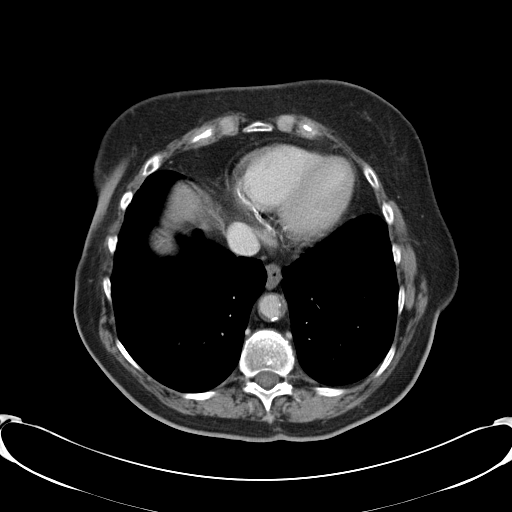

[Series 603: <mpr thick range(1)> · coronal · 0.82mm/px · 3 of 85 slices shown]
[im 29/85  soft-tissue]
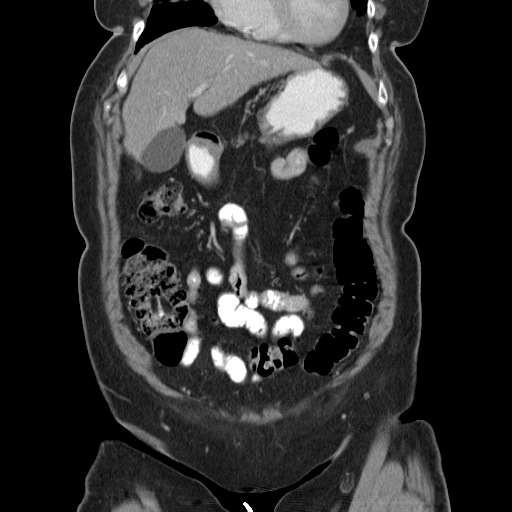
[im 38/85  soft-tissue]
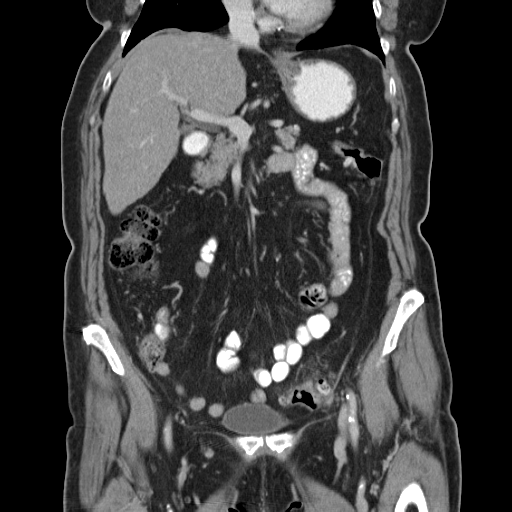
[im 47/85  soft-tissue]
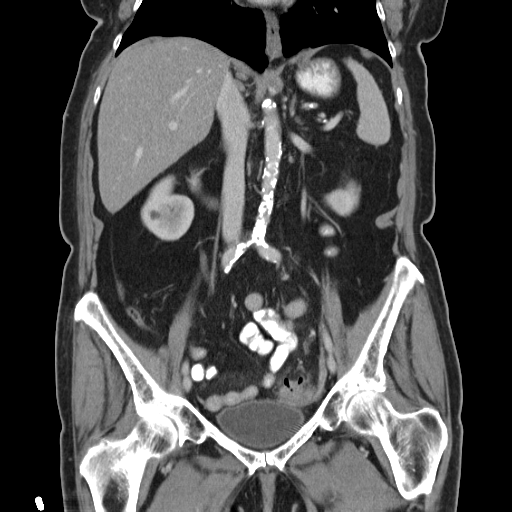

[16 of 46 positions shown; findings below may reference images not displayed]

FINDINGS: Lung bases: Chronic scarring atelectasis in the posterior inferior
right middle lobe with mild scarring in the posterior base of the
left upper lobe lingula. Heart mildly enlarged. There are coronary
artery calcifications. These findings are stable.

Liver:  Fatty infiltration.  No mass or focal lesion.

Spleen, gallbladder, pancreas, adrenal glands:  Unremarkable.

Kidneys, ureters, bladder: 9 mm mixed density mass arises from the
anterior midpole the right kidney. This is similar in size to what
was in June 2007. The strongly supports a benign etiology. Tiny
presumed cyst arises from the anterior lower pole of the right
kidney. There is left renal cortical thinning. No other renal
masses. No stones. No hydronephrosis. Ureters normal course and
caliber. Bladder are unremarkable.

Uterus and adnexa: Prominence in the right uterine fundus is similar
to the prior study likely a fibroid. Otherwise unremarkable.

Lymph nodes: There are prominent and mildly enlarged Tiger caval
lymph nodes, largest measuring 12.6 mm in short axis, similar to the
prior study. No other adenopathy.

Ascites:  None.

Gastrointestinal: There is inflammation of the mid sigmoid colon
where there are numerous diverticula. A small fluid collection
protrudes from the superior margin of the involved segment of
sigmoid colon measuring 14 mm in size consistent with small very
diverticular abscess. No extraluminal air. With no other evidence
diverticulitis. Small bowel is unremarkable. Normal appendix is
visualized. There are numerous other colonic diverted

Musculoskeletal: There are degenerative changes of the lumbar spine
and arthropathic changes of both hips. No osteoblastic or osteolytic
lesions.
IMPRESSION: 1. Diverticulitis of the mid sigmoid colon with a small, 14 mm cup
very diverticular abscess.
2. No other acute findings.

## 2016-10-06 ENCOUNTER — Encounter: Payer: Self-pay | Admitting: Family Medicine

## 2016-10-06 ENCOUNTER — Ambulatory Visit (INDEPENDENT_AMBULATORY_CARE_PROVIDER_SITE_OTHER): Payer: Medicare Other | Admitting: Family Medicine

## 2016-10-06 DIAGNOSIS — E119 Type 2 diabetes mellitus without complications: Secondary | ICD-10-CM

## 2016-10-06 DIAGNOSIS — I10 Essential (primary) hypertension: Secondary | ICD-10-CM

## 2016-10-06 NOTE — Progress Notes (Signed)
Pre visit review using our clinic review tool, if applicable. No additional management support is needed unless otherwise documented below in the visit note. 

## 2016-10-06 NOTE — Assessment & Plan Note (Signed)
S: controlled on losartan-hctz 100-25mg  and amlodipine 2.5mg . Home checks in 120s or 130s at home- looking much better.  BP Readings from Last 3 Encounters:  10/06/16 128/64  07/12/16 (!) 146/62  07/05/16 (!) 166/80  A/P:Continue current meds:  Doing very well

## 2016-10-06 NOTE — Patient Instructions (Signed)
Blood pressure looks great  Linda Golden or Linda Golden will let you go if a1c under 7.5, otherwise I would like to speak to you again  Follow up about 4 months.   I would also like for you to sign up for an annual wellness visit with our nurse, Manuela Schwartz, who specializes in the annual wellness exam. This is a free benefit under medicare that may help [place] find additional ways to help you. Some highlights are reviewing medications, lifestyle, and doing a dementia screen. You said you would call in spring for this

## 2016-10-06 NOTE — Progress Notes (Signed)
Subjective:  Linda Golden is a 82 y.o. year old very pleasant female patient who presents for/with See problem oriented charting ROS- No chest pain or shortness of breath. No headache or blurry vision.   No hypoglycemia  Past Medical History-  Patient Active Problem List   Diagnosis Date Noted  . Diabetes mellitus type II, controlled, with no complications ([place]) 0000000    Priority: High  . CKD (chronic kidney disease), stage II 10/23/2014    Priority: Medium  . Hyperlipidemia 04/18/2007    Priority: Medium  . Essential hypertension 04/18/2007    Priority: Medium  . Raynaud's disease 10/08/2013    Priority: Low  . Osteoarthritis 07/01/2008    Priority: Low  . Varicose veins 11/04/2007    Priority: Low  . Osteopenia 04/18/2007    Priority: Low  . Diverticulitis of intestine with abscess 07/03/2015    Medications- reviewed and updated Current Outpatient Prescriptions  Medication Sig Dispense Refill  . acetaminophen (TYLENOL) 500 MG tablet Take 500 mg by mouth every 6 (six) hours as needed for mild pain, moderate pain, fever or headache. Reported on 12/24/2015    . amLODipine (NORVASC) 2.5 MG tablet Take 1-2 tablets (2.5-5 mg total) by mouth daily. 60 tablet 5  . aspirin EC 81 MG tablet Take 81 mg by mouth daily.    Marland Kitchen losartan-hydrochlorothiazide (HYZAAR) 100-25 MG tablet Take 1 tablet by mouth daily. 90 tablet 3   No current facility-administered medications for this visit.     Objective: BP 128/64 (BP Location: Left Arm, Patient Position: Sitting, Cuff Size: Normal)   Pulse 72   Temp 97.8 F (36.6 C) (Oral)   Wt 124 lb 9.6 oz (56.5 kg)   SpO2 98%   BMI 22.07 kg/m  Gen: NAD, resting comfortably CV: RRR no murmurs rubs or gallops Lungs: CTAB no crackles, wheeze, rhonchi Abdomen: soft/nontender/nondistended/normal bowel sounds.normal weight Ext: no edema Skin: warm, dry  Assessment/Plan:  Diabetes mellitus type II, controlled, with no complications ([place]) S: well  controlled. On no meds, diabetes education alone- tries to watch diet Lab Results  Component Value Date   HGBA1C 6.8 (H) 06/28/2016   HGBA1C 6.9 (H) 12/24/2015   HGBA1C 6.6 (H) 05/24/2015   A/P: verbally reported to me as 6.3 by Rosealee Albee, CMA- this will be placed in computer- no change in medication  Essential hypertension S: controlled on losartan-hctz 100-25mg  and amlodipine 2.5mg . Home checks in 120s or 130s at home- looking much better.  BP Readings from Last 3 Encounters:  10/06/16 128/64  07/12/16 (!) 146/62  07/05/16 (!) 166/80  A/P:Continue current meds:  Doing very well  4 months  Return precautions advised.  [name], MD

## 2016-10-06 NOTE — Assessment & Plan Note (Signed)
S: well controlled. On no meds, diabetes education alone- tries to watch diet Lab Results  Component Value Date   HGBA1C 6.8 (H) 06/28/2016   HGBA1C 6.9 (H) 12/24/2015   HGBA1C 6.6 (H) 05/24/2015   A/P: verbally reported to me as 6.3 by Rosealee Albee, CMA- this will be placed in computer- no change in medication

## 2016-10-10 LAB — POCT GLYCOSYLATED HEMOGLOBIN (HGB A1C): Hemoglobin A1C: 6.3

## 2016-10-10 NOTE — Addendum Note (Signed)
Addended by: Wyvonne Lenz on: 10/10/2016 09:06 AM   Modules accepted: Orders

## 2016-10-10 NOTE — Progress Notes (Signed)
Sorry Dr Yong Channel, the order has been put in and results recorded. Thank you

## 2016-10-13 DIAGNOSIS — B351 Tinea unguium: Secondary | ICD-10-CM | POA: Diagnosis not present

## 2016-10-13 DIAGNOSIS — E1342 Other specified diabetes mellitus with diabetic polyneuropathy: Secondary | ICD-10-CM | POA: Diagnosis not present

## 2016-10-13 DIAGNOSIS — L851 Acquired keratosis [keratoderma] palmaris et plantaris: Secondary | ICD-10-CM | POA: Diagnosis not present

## 2016-11-24 ENCOUNTER — Encounter: Payer: Self-pay | Admitting: Family Medicine

## 2016-11-24 ENCOUNTER — Ambulatory Visit (INDEPENDENT_AMBULATORY_CARE_PROVIDER_SITE_OTHER): Payer: Medicare Other | Admitting: Family Medicine

## 2016-11-24 VITALS — BP 128/62 | HR 98 | Temp 98.5°F | Ht 63.0 in | Wt 126.3 lb

## 2016-11-24 DIAGNOSIS — J989 Respiratory disorder, unspecified: Secondary | ICD-10-CM

## 2016-11-24 LAB — POC INFLUENZA A&B (BINAX/QUICKVUE)
Influenza A, POC: NEGATIVE
Influenza B, POC: NEGATIVE

## 2016-11-24 MED ORDER — BENZONATATE 100 MG PO CAPS: 100.0000 mg | ORAL_CAPSULE | Freq: Three times a day (TID) | ORAL | 0 refills | Status: DC | PRN

## 2016-11-24 NOTE — Progress Notes (Signed)
HPI:  URI -started: 3-4 days ago -symptoms:clear nasal congestion, sore throat, cough, thinks had low grade fever at night -denies:fever, SOB, NVD, tooth pain, body aches, sinus pain -has tried: nothing -sick contacts/travel/risks: no reported flu, strep or tick exposure -she reports she doesn't feel too bad, but her son wants to know if she has the flu and she wants the flu test as has grand kids  ROS: See pertinent positives and negatives per HPI.  Past Medical History:  Diagnosis Date  . 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 Diabetes mellitus   . Hyperlipidemia   . Hypertension   . Kidney mass    was told was born with this  . Osteopenia     Past Surgical History:  Procedure Laterality Date  . CATARACT EXTRACTION     bilateral planned-right done, left soon  . foot spur      Family History  Problem Relation Age of Onset  . Hypertension Father   . Alzheimer's disease Mother     Social History   Social History  . Marital status: Married    Spouse name: N/A  . Number of children: N/A  . Years of education: N/A   Social History Main Topics  . Smoking status: Never Smoker  . Smokeless tobacco: Never Used  . Alcohol use No  . Drug use: No  . Sexual activity: Not Asked   Other Topics Concern  . None   Social History Narrative   Widowed 2004. 1 son. 2 adopted grandchildren, 2 greatgrandkids.    [place] farm. Son lives close as well as grandson (next door). Completely independent. Still mows her yard.       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       Hobbies: reading, work outside           Current Outpatient Prescriptions:  .  acetaminophen (TYLENOL) 500 MG tablet, Take 500 mg by mouth every 6 (six) hours as needed for mild pain, moderate pain, fever or headache. Reported on 12/24/2015, Disp: , Rfl:  .  amLODipine (NORVASC) 2.5 MG tablet, Take 1-2 tablets (2.5-5 mg total) by mouth daily., Disp: 60 tablet, Rfl: 5 .  aspirin EC  81 MG tablet, Take 81 mg by mouth daily., Disp: , Rfl:  .  losartan-hydrochlorothiazide (HYZAAR) 100-25 MG tablet, Take 1 tablet by mouth daily., Disp: 90 tablet, Rfl: 3 .  benzonatate (TESSALON) 100 MG capsule, Take 1 capsule (100 mg total) by mouth 3 (three) times daily as needed for cough., Disp: 20 capsule, Rfl: 0  EXAM:  Vitals:   11/24/16 1355  BP: 128/62  Pulse: 98  Temp: 98.5 F (36.9 C)    Body mass index is 22.37 kg/m.  GENERAL: vitals reviewed and listed above, alert, oriented, appears well hydrated and in no acute distress  HEENT: atraumatic, conjunttiva clear, no obvious abnormalities on inspection of external nose and ears, normal appearance of ear canals and TMs, clear nasal congestion, mild post oropharyngeal erythema with PND, no tonsillar edema or exudate, no sinus TTP  NECK: no obvious masses on inspection  LUNGS: clear to auscultation bilaterally, no wheezes, rales or rhonchi, good air movement  CV: HRRR, no peripheral edema  MS: moves all extremities without noticeable abnormality  PSYCH: pleasant and cooperative, no obvious depression or anxiety  ASSESSMENT AND PLAN:  Discussed the following assessment and plan:  Respiratory illness  -given HPI and exam findings today, a  serious infection or illness is unlikely. We discussed potential etiologies, with VURI or mild influenza being most likely, and advised supportive care and monitoring. We discussed limitations of flu testing, treatment window/risks/benefits tamiflu, potential complications of flu. She does not want to take tamiflu but does still want the flu test. We advised even if it is negative that does not mean she did not have a mild case of the flu. We discussed treatment side effects, likely course, antibiotic misuse, transmission, and signs of developing a serious illness.tessalon for cough. -of course, we advised to return or notify a doctor immediately if symptoms worsen or persist or new  concerns arise.    Patient Instructions  INSTRUCTIONS FOR UPPER RESPIRATORY INFECTION:  -plenty of rest and fluids  -nasal saline wash 2-3 times daily (use prepackaged nasal saline or bottled/distilled water if making your own)   -can use AFRIN nasal spray for drainage and nasal congestion - but do NOT use longer then 3-4 days  -can use tylenol (in no history of liver disease) or ibuprofen (if no history of kidney disease, bowel bleeding or significant heart disease) as directed for aches and sorethroat  -in the winter time, using a humidifier at night is helpful (please follow cleaning instructions)  -if you are taking a cough medication - use only as directed, may also try a teaspoon of honey to coat the throat and throat lozenges. Tessalon sent to the pharmacy.  -for sore throat, salt water gargles can help  -follow up if you have fevers, facial pain, tooth pain, difficulty breathing or are worsening or symptoms persist longer then expected  Upper Respiratory Infection, Adult An upper respiratory infection (URI) is also known as the common cold. The flu is also a virus that can cause your symptoms. It is often caused by a type of germ (virus). Colds are easily spread (contagious). You can pass it to others by kissing, coughing, sneezing, or drinking out of the same glass. Usually, you get better in 1 to 2 weeks.  However, the cough can last for even longer. HOME CARE   Only take medicine as told by your doctor. Follow instructions provided above.  Drink enough water and fluids to keep your pee (urine) clear or pale yellow.  Get plenty of rest.  Return to work when your temperature is < 100 for 24 hours or as told by your doctor. You may use a face mask and wash your hands to stop your cold from spreading. GET HELP RIGHT AWAY IF:   After the first few days, you feel you are getting worse.  You have questions about your medicine.  You have chills, shortness of breath, or red  spit (mucus).  You have pain in the face for more then 1-2 days, especially when you bend forward.  You have a fever, puffy (swollen) neck, pain when you swallow, or white spots in the back of your throat.  You have a bad headache, ear pain, sinus pain, or chest pain.  You have a high-pitched whistling sound when you breathe in and out (wheezing).  You cough up blood.  You have sore muscles or a stiff neck. MAKE SURE YOU:   Understand these instructions.  Will watch your condition.  Will get help right away if you are not doing well or get worse. Document Released: 03/20/2008 Document Revised: 12/25/2011 Document Reviewed: 01/07/2014 [facility] Patient Information 2015 [place], [place]. This information is not intended to replace advice given to you by your health care  provider. Make sure you discuss any questions you have with your health care provider.    Colin Benton R., DO

## 2016-11-24 NOTE — Patient Instructions (Signed)
INSTRUCTIONS FOR UPPER RESPIRATORY INFECTION:  -plenty of rest and fluids  -nasal saline wash 2-3 times daily (use prepackaged nasal saline or bottled/distilled water if making your own)   -can use AFRIN nasal spray for drainage and nasal congestion - but do NOT use longer then 3-4 days  -can use tylenol (in no history of liver disease) or ibuprofen (if no history of kidney disease, bowel bleeding or significant heart disease) as directed for aches and sorethroat  -in the winter time, using a humidifier at night is helpful (please follow cleaning instructions)  -if you are taking a cough medication - use only as directed, may also try a teaspoon of honey to coat the throat and throat lozenges. Tessalon sent to the pharmacy.  -for sore throat, salt water gargles can help  -follow up if you have fevers, facial pain, tooth pain, difficulty breathing or are worsening or symptoms persist longer then expected  Upper Respiratory Infection, Adult An upper respiratory infection (URI) is also known as the common cold. The flu is also a virus that can cause your symptoms. It is often caused by a type of germ (virus). Colds are easily spread (contagious). You can pass it to others by kissing, coughing, sneezing, or drinking out of the same glass. Usually, you get better in 1 to 2 weeks.  However, the cough can last for even longer. HOME CARE   Only take medicine as told by your doctor. Follow instructions provided above.  Drink enough water and fluids to keep your pee (urine) clear or pale yellow.  Get plenty of rest.  Return to work when your temperature is < 100 for 24 hours or as told by your doctor. You may use a face mask and wash your hands to stop your cold from spreading. GET HELP RIGHT AWAY IF:   After the first few days, you feel you are getting worse.  You have questions about your medicine.  You have chills, shortness of breath, or red spit (mucus).  You have pain in the face for  more then 1-2 days, especially when you bend forward.  You have a fever, puffy (swollen) neck, pain when you swallow, or white spots in the back of your throat.  You have a bad headache, ear pain, sinus pain, or chest pain.  You have a high-pitched whistling sound when you breathe in and out (wheezing).  You cough up blood.  You have sore muscles or a stiff neck. MAKE SURE YOU:   Understand these instructions.  Will watch your condition.  Will get help right away if you are not doing well or get worse. Document Released: 03/20/2008 Document Revised: 12/25/2011 Document Reviewed: 01/07/2014 [facility] Patient Information 2015 [place], [place]. This information is not intended to replace advice given to you by your health care provider. Make sure you discuss any questions you have with your health care provider.

## 2016-11-24 NOTE — Progress Notes (Signed)
Pre visit review using our clinic review tool, if applicable. No additional management support is needed unless otherwise documented below in the visit note. 

## 2016-11-24 NOTE — Addendum Note (Signed)
Addended by: Agnes Lawrence on: 11/24/2016 02:45 PM   Modules accepted: Orders

## 2017-01-05 DIAGNOSIS — L851 Acquired keratosis [keratoderma] palmaris et plantaris: Secondary | ICD-10-CM | POA: Diagnosis not present

## 2017-01-05 DIAGNOSIS — E1342 Other specified diabetes mellitus with diabetic polyneuropathy: Secondary | ICD-10-CM | POA: Diagnosis not present

## 2017-01-05 DIAGNOSIS — B351 Tinea unguium: Secondary | ICD-10-CM | POA: Diagnosis not present

## 2017-02-05 ENCOUNTER — Encounter: Payer: Self-pay | Admitting: Family Medicine

## 2017-02-05 ENCOUNTER — Ambulatory Visit (INDEPENDENT_AMBULATORY_CARE_PROVIDER_SITE_OTHER): Payer: Medicare Other | Admitting: Family Medicine

## 2017-02-05 VITALS — BP 146/72 | HR 71 | Temp 97.6°F | Ht 63.0 in | Wt 125.0 lb

## 2017-02-05 DIAGNOSIS — E7800 Pure hypercholesterolemia, unspecified: Secondary | ICD-10-CM | POA: Diagnosis not present

## 2017-02-05 DIAGNOSIS — E119 Type 2 diabetes mellitus without complications: Secondary | ICD-10-CM

## 2017-02-05 DIAGNOSIS — I10 Essential (primary) hypertension: Secondary | ICD-10-CM

## 2017-02-05 DIAGNOSIS — R21 Rash and other nonspecific skin eruption: Secondary | ICD-10-CM | POA: Diagnosis not present

## 2017-02-05 LAB — LIPID PANEL
Cholesterol: 264 mg/dL — ABNORMAL HIGH (ref 0–200)
HDL: 44.8 mg/dL (ref >39.00)
NONHDL: 219.47
Total CHOL/HDL Ratio: 6
Triglycerides: 227 mg/dL — ABNORMAL HIGH (ref 0.0–149.0)
VLDL: 45.4 mg/dL — AB (ref 0.0–40.0)

## 2017-02-05 LAB — CBC
HEMATOCRIT: 40.9 % (ref 36.0–46.0)
Hemoglobin: 13.6 g/dL (ref 12.0–15.0)
MCHC: 33.3 g/dL (ref 30.0–36.0)
MCV: 87.5 fl (ref 78.0–100.0)
Platelets: 271 10*3/uL (ref 150.0–400.0)
RBC: 4.67 Mil/uL (ref 3.87–5.11)
RDW: 13 % (ref 11.5–15.5)
WBC: 8 10*3/uL (ref 4.0–10.5)

## 2017-02-05 LAB — COMPREHENSIVE METABOLIC PANEL
ALT: 16 U/L (ref 0–35)
AST: 17 U/L (ref 0–37)
Albumin: 4.5 g/dL (ref 3.5–5.2)
Alkaline Phosphatase: 64 U/L (ref 39–117)
BUN: 31 mg/dL — AB (ref 6–23)
CO2: 28 meq/L (ref 19–32)
Calcium: 10.2 mg/dL (ref 8.4–10.5)
Chloride: 102 mEq/L (ref 96–112)
Creatinine, Ser: 0.94 mg/dL (ref 0.40–1.20)
GFR: 59.75 mL/min — ABNORMAL LOW (ref >60.00)
GLUCOSE: 138 mg/dL — AB (ref 70–99)
POTASSIUM: 4.1 meq/L (ref 3.5–5.1)
SODIUM: 139 meq/L (ref 135–145)
TOTAL PROTEIN: 7.6 g/dL (ref 6.0–8.3)
Total Bilirubin: 0.5 mg/dL (ref 0.2–1.2)

## 2017-02-05 LAB — LDL CHOLESTEROL, DIRECT: LDL DIRECT: 185 mg/dL

## 2017-02-05 LAB — HEMOGLOBIN A1C: HEMOGLOBIN A1C: 7.1 % — AB (ref 4.6–6.5)

## 2017-02-05 MED ORDER — TRIAMCINOLONE ACETONIDE 0.1 % EX CREA: 1.0000 "application " | TOPICAL_CREAM | Freq: Two times a day (BID) | CUTANEOUS | 0 refills | Status: DC

## 2017-02-05 NOTE — Assessment & Plan Note (Signed)
S: well controlled. On no rx CBGs- does not check regularly Lab Results  Component Value Date   HGBA1C 6.3 10/06/2016   HGBA1C 6.8 (H) 06/28/2016   HGBA1C 6.9 (H) 12/24/2015   A/P: update labs today

## 2017-02-05 NOTE — Assessment & Plan Note (Signed)
Does not want to take statin due to prior hair loss- will update full lipid panel today- may consider retrial if ldl over 190 but has been below this on recent checks

## 2017-02-05 NOTE — Progress Notes (Signed)
Subjective:  Linda Golden is a 83 y.o. year old very pleasant female patient who presents for/with See problem oriented charting ROS- No chest pain or shortness of breath. No headache or blurry vision. Does have some left sided itching   Past Medical History-  Patient Active Problem List   Diagnosis Date Noted  . Diabetes mellitus type II, controlled, with no complications ([place]) 54/[id number]    Priority: High  . Hyperlipidemia 04/18/2007    Priority: Medium  . Essential hypertension 04/18/2007    Priority: Medium  . CKD (chronic kidney disease), stage II 10/23/2014    Priority: Low  . Raynaud's disease 10/08/2013    Priority: Low  . Osteoarthritis 07/01/2008    Priority: Low  . Varicose veins 11/04/2007    Priority: Low  . Osteopenia 04/18/2007    Priority: Low  . Diverticulitis of intestine with abscess 07/03/2015    Medications- reviewed and updated Current Outpatient Prescriptions  Medication Sig Dispense Refill  . amLODipine (NORVASC) 2.5 MG tablet Take 1-2 tablets (2.5-5 mg total) by mouth daily. 60 tablet 5  . aspirin EC 81 MG tablet Take 81 mg by mouth daily.    Marland Kitchen losartan-hydrochlorothiazide (HYZAAR) 100-25 MG tablet Take 1 tablet by mouth daily. 90 tablet 3  . triamcinolone cream (KENALOG) 0.1 % Apply 1 application topically 2 (two) times daily. For 7-10 days maximum 80 g 0   No current facility-administered medications for this visit.     Objective: BP (!) 146/72   Pulse 71   Temp 97.6 F (36.4 C) (Oral)   Ht 5\' 3"  (1.6 m)   Wt 125 lb (56.7 kg)   SpO2 99%   BMI 22.14 kg/m  Gen: NAD, resting comfortably, appears younger than stated age CV: RRR no murmurs rubs or gallops Lungs: CTAB no crackles, wheeze, rhonchi Abdomen: soft/nontender/nondistended/normal bowel sounds. No rebound or guarding.  Ext: no edema Skin: warm, dry, no rash or excoriation along left chest wall in area of reported pruritis  Assessment/Plan:  Rash S: started with itching along side  of left breast now back along rib in same level distribution. No rash at this point. No pain.  A/P: will trial triamcinolone. In dermatomal distribution- if she has pain or rash will let us knoe immediately- concerned about possible shingles  Diabetes mellitus type II, controlled, with no complications ([place]) S: well controlled. On no rx CBGs- does not check regularly Lab Results  Component Value Date   HGBA1C 6.3 10/06/2016   HGBA1C 6.8 (H) 06/28/2016   HGBA1C 6.9 (H) 12/24/2015   A/P: update labs today  Essential hypertension S: controlled previously in office and at home with BP around 130. On Losartan 100mg -hctz 25mg , amlodipine 2.5 mg BP Readings from Last 3 Encounters:  02/05/17 (!) 146/72  11/24/16 128/62  10/06/16 128/64  A/P:Continue current meds:  Slight elevation today but controlled at home and previously in office- will monitor only for now  Hyperlipidemia Does not want to take statin due to prior hair loss- will update full lipid panel today- may consider retrial if ldl over 190 but has been below this on recent checks  4 months  Orders Placed This Encounter  Procedures  . CBC    [place]  . Comprehensive metabolic panel    [place]    Order Specific Question:   Has the patient fasted?    Answer:   No  . Lipid panel    [place]    Order Specific Question:   Has the  patient fasted?    Answer:   No  . Hemoglobin A1c    [place]    Meds ordered this encounter  Medications  . triamcinolone cream (KENALOG) 0.1 %    Sig: Apply 1 application topically 2 (two) times daily. For 7-10 days maximum    Dispense:  80 g    Refill:  0    Return precautions advised.  [name], MD

## 2017-02-05 NOTE — Assessment & Plan Note (Signed)
S: controlled previously in office and at home with BP around 130. On Losartan 100mg -hctz 25mg , amlodipine 2.5 mg BP Readings from Last 3 Encounters:  02/05/17 (!) 146/72  11/24/16 128/62  10/06/16 128/64  A/P:Continue current meds:  Slight elevation today but controlled at home and previously in office- will monitor only for now

## 2017-02-05 NOTE — Progress Notes (Signed)
Pre visit review using our clinic review tool, if applicable. No additional management support is needed unless otherwise documented below in the visit note. 

## 2017-02-05 NOTE — Patient Instructions (Addendum)
Please stop by lab before you go  Can use triamcinolone cream for the itching twice a day up to 10 days. If you get pain or a rash in the area let us know immediately  Recheck blood pressure. Bring cuff with you once again to next visit  ______________________________________________________________________  Starting October 1st 2018, I will be transferring to our new location: [facility] (corner of [address] [employer]) [place], [place] Phone: [phone]  I would love to have you remain my patient at this new location as long as it remains convenient for you. I am excited about the opportunity to have x-ray and sports medicine in the new building but will really miss the awesome staff and physicians at [facility]. Continue to schedule appointments at [facility] and we will automatically transfer them to the [place] location starting October 1st.

## 2017-03-23 DIAGNOSIS — L851 Acquired keratosis [keratoderma] palmaris et plantaris: Secondary | ICD-10-CM | POA: Diagnosis not present

## 2017-03-23 DIAGNOSIS — E1342 Other specified diabetes mellitus with diabetic polyneuropathy: Secondary | ICD-10-CM | POA: Diagnosis not present

## 2017-03-23 DIAGNOSIS — B351 Tinea unguium: Secondary | ICD-10-CM | POA: Diagnosis not present

## 2017-04-16 DIAGNOSIS — L570 Actinic keratosis: Secondary | ICD-10-CM | POA: Diagnosis not present

## 2017-04-16 DIAGNOSIS — L298 Other pruritus: Secondary | ICD-10-CM | POA: Diagnosis not present

## 2017-04-16 DIAGNOSIS — Z1283 Encounter for screening for malignant neoplasm of skin: Secondary | ICD-10-CM | POA: Diagnosis not present

## 2017-04-16 DIAGNOSIS — L820 Inflamed seborrheic keratosis: Secondary | ICD-10-CM | POA: Diagnosis not present

## 2017-04-16 DIAGNOSIS — X32XXXD Exposure to sunlight, subsequent encounter: Secondary | ICD-10-CM | POA: Diagnosis not present

## 2017-04-16 DIAGNOSIS — D225 Melanocytic nevi of trunk: Secondary | ICD-10-CM | POA: Diagnosis not present

## 2017-05-30 ENCOUNTER — Ambulatory Visit (INDEPENDENT_AMBULATORY_CARE_PROVIDER_SITE_OTHER): Payer: Medicare Other

## 2017-05-30 VITALS — BP 146/60 | HR 63 | Ht 63.0 in | Wt 123.4 lb

## 2017-05-30 DIAGNOSIS — Z0000 Encounter for general adult medical examination without abnormal findings: Secondary | ICD-10-CM

## 2017-05-30 NOTE — Patient Instructions (Addendum)
Linda Golden , Thank you for taking time to come for your Medicare Wellness Visit. I appreciate your ongoing commitment to your health goals. Please review the following plan we discussed and let me know if I can assist you in the future.   Sometimes eating more plant based foods will assist with cholesterol  Drink plenty of water to help with sodium issues  Will discuss A1c with Dr. Yong Channel Just remember that elevated sugar puts you at risk for stroke, heart disease, eye issues and kidney issues. Ask dr. Yong Channel what medicine you can take that would have the least side effects May consider keeping a diary of your food intake and your blood sugars  A Tetanus is recommended every 10 years. Medicare covers a tetanus if you have a cut or wound; otherwise, there may be a charge. If you had not had a tetanus with pertusses, known as the Tdap, you can take this anytime.  Try [facility]   These are the goals we discussed: Goals    . patient          Stay active and healthy       This is a list of the screening recommended for you and due dates:  Health Maintenance  Topic Date Due  . Tetanus Vaccine  10/17/2015  . Flu Shot  05/16/2017  . Eye exam for diabetics  08/02/2017  . Hemoglobin A1C  08/07/2017  . Complete foot exam   10/06/2017  . DEXA scan (bone density measurement)  Addressed  . Pneumonia vaccines  Completed      Fat and Cholesterol Restricted Diet Getting too much fat and cholesterol in your diet may cause health problems. Following this diet helps keep your fat and cholesterol at normal levels. This can keep you from getting sick. What types of fat should I choose?  Choose monosaturated and polyunsaturated fats. These are found in foods such as olive oil, canola oil, flaxseeds, walnuts, almonds, and seeds.  Eat more omega-3 fats. Good choices include salmon, mackerel, sardines, tuna, flaxseed oil, and ground flaxseeds.  Limit saturated fats. These are in animal  products such as meats, butter, and cream. They can also be in plant products such as palm oil, palm kernel oil, and coconut oil.  Avoid foods with partially hydrogenated oils in them. These contain trans fats. Examples of foods that have trans fats are stick margarine, some tub margarines, cookies, crackers, and other baked goods. What general guidelines do I need to follow?  Check food labels. Look for the words "trans fat" and "saturated fat."  When preparing a meal: ? Fill half of your plate with vegetables and green salads. ? Fill one fourth of your plate with whole grains. Look for the word "whole" as the first word in the ingredient list. ? Fill one fourth of your plate with lean protein foods.  Eat more foods that have fiber, like apples, carrots, beans, peas, and barley.  Eat more home-cooked foods. Eat less at restaurants and buffets.  Limit or avoid alcohol.  Limit foods high in starch and sugar.  Limit fried foods.  Cook foods without frying them. Baking, boiling, grilling, and broiling are all great options.  Lose weight if you are overweight. Losing even a small amount of weight can help your overall health. It can also help prevent diseases such as diabetes and heart disease. What foods can I eat? Grains Whole grains, such as whole wheat or whole grain breads, crackers, cereals, and pasta. Unsweetened  oatmeal, bulgur, barley, quinoa, or brown rice. Corn or whole wheat flour tortillas. Vegetables Fresh or frozen vegetables (raw, steamed, roasted, or grilled). Green salads. Fruits All fresh, canned (in natural juice), or frozen fruits. Meat and Other Protein Products Ground beef (85% or leaner), grass-fed beef, or beef trimmed of fat. Skinless chicken or [place]. Ground chicken or [place]. Pork trimmed of fat. All fish and seafood. Eggs. Dried beans, peas, or lentils. Unsalted nuts or seeds. Unsalted canned or dry beans. Dairy Low-fat dairy products, such as skim or 1%  milk, 2% or reduced-fat cheeses, low-fat ricotta or cottage cheese, or plain low-fat yogurt. Fats and Oils Tub margarines without trans fats. Light or reduced-fat mayonnaise and salad dressings. Avocado. Olive, canola, sesame, or safflower oils. Natural peanut or almond butter (choose ones without added sugar and oil). The items listed above may not be a complete list of recommended foods or beverages. Contact your dietitian for more options. What foods are not recommended? Grains White bread. White pasta. White rice. Cornbread. Bagels, pastries, and croissants. Crackers that contain trans fat. Vegetables White potatoes. Corn. Creamed or fried vegetables. Vegetables in a cheese sauce. Fruits Dried fruits. Canned fruit in light or heavy syrup. Fruit juice. Meat and Other Protein Products Fatty cuts of meat. Ribs, chicken wings, bacon, sausage, bologna, salami, chitterlings, fatback, hot dogs, bratwurst, and packaged luncheon meats. Liver and organ meats. Dairy Whole or 2% milk, cream, half-and-half, and cream cheese. Whole milk cheeses. Whole-fat or sweetened yogurt. Full-fat cheeses. Nondairy creamers and whipped toppings. Processed cheese, cheese spreads, or cheese curds. Sweets and Desserts Corn syrup, sugars, honey, and molasses. Candy. Jam and jelly. Syrup. Sweetened cereals. Cookies, pies, cakes, donuts, muffins, and ice cream. Fats and Oils Butter, stick margarine, lard, shortening, ghee, or bacon fat. Coconut, palm kernel, or palm oils. Beverages Alcohol. Sweetened drinks (such as sodas, lemonade, and fruit drinks or punches). The items listed above may not be a complete list of foods and beverages to avoid. Contact your dietitian for more information. This information is not intended to replace advice given to you by your health care provider. Make sure you discuss any questions you have with your health care provider. Document Released: 04/02/2012 Document Revised: 06/08/2016 Document  Reviewed: 01/01/2014 Elsevier Interactive Patient Education  2018 [place] in the Home Falls can cause injuries and can affect people from all age groups. There are many simple things that you can do to make your home safe and to help prevent falls. What can I do on the outside of my home?  Regularly repair the edges of walkways and driveways and fix any cracks.  Remove high doorway thresholds.  Trim any shrubbery on the main path into your home.  Use bright outdoor lighting.  Clear walkways of debris and clutter, including tools and rocks.  Regularly check that handrails are securely fastened and in good repair. Both sides of any steps should have handrails.  Install guardrails along the edges of any raised decks or porches.  Have leaves, snow, and ice cleared regularly.  Use sand or salt on walkways during winter months.  In the garage, clean up any spills right away, including grease or oil spills. What can I do in the bathroom?  Use night lights.  Install grab bars by the toilet and in the tub and shower. Do not use towel bars as grab bars.  Use non-skid mats or decals on the floor of the tub or shower.  If you need  to sit down while you are in the shower, use a plastic, non-slip stool.  Keep the floor dry. Immediately clean up any water that spills on the floor.  Remove soap buildup in the tub or shower on a regular basis.  Attach bath mats securely with double-sided non-slip rug tape.  Remove throw rugs and other tripping hazards from the floor. What can I do in the bedroom?  Use night lights.  Make sure that a bedside light is easy to reach.  Do not use oversized bedding that drapes onto the floor.  Have a firm chair that has side arms to use for getting dressed.  Remove throw rugs and other tripping hazards from the floor. What can I do in the kitchen?  Clean up any spills right away.  Avoid walking on wet floors.  Place  frequently used items in easy-to-reach places.  If you need to reach for something above you, use a sturdy step stool that has a grab bar.  Keep electrical cables out of the way.  Do not use floor polish or wax that makes floors slippery. If you have to use wax, make sure that it is non-skid floor wax.  Remove throw rugs and other tripping hazards from the floor. What can I do in the stairways?  Do not leave any items on the stairs.  Make sure that there are handrails on both sides of the stairs. Fix handrails that are broken or loose. Make sure that handrails are as long as the stairways.  Check any carpeting to make sure that it is firmly attached to the stairs. Fix any carpet that is loose or worn.  Avoid having throw rugs at the top or bottom of stairways, or secure the rugs with carpet tape to prevent them from moving.  Make sure that you have a light switch at the top of the stairs and the bottom of the stairs. If you do not have them, have them installed. What are some other fall prevention tips?  Wear closed-toe shoes that fit well and support your feet. Wear shoes that have rubber soles or low heels.  When you use a stepladder, make sure that it is completely opened and that the sides are firmly locked. Have someone hold the ladder while you are using it. Do not climb a closed stepladder.  Add color or contrast paint or tape to grab bars and handrails in your home. Place contrasting color strips on the first and last steps.  Use mobility aids as needed, such as canes, walkers, scooters, and crutches.  Turn on lights if it is dark. Replace any light bulbs that burn out.  Set up furniture so that there are clear paths. Keep the furniture in the same spot.  Fix any uneven floor surfaces.  Choose a carpet design that does not hide the edge of steps of a stairway.  Be aware of any and all pets.  Review your medicines with your healthcare provider. Some medicines can cause  dizziness or changes in blood pressure, which increase your risk of falling. Talk with your health care provider about other ways that you can decrease your risk of falls. This may include working with a physical therapist or trainer to improve your strength, balance, and endurance. This information is not intended to replace advice given to you by your health care provider. Make sure you discuss any questions you have with your health care provider. Document Released: 09/22/2002 Document Revised: 02/29/2016 Document Reviewed: 11/06/2014 Elsevier  Interactive Patient Education  2017 [employer].

## 2017-05-30 NOTE — Progress Notes (Signed)
I have reviewed and agree with note, evaluation, plan. Look forward to seeing her to discuss blood sugars  [name], MD

## 2017-05-30 NOTE — Progress Notes (Signed)
Subjective:   SHACARRA CHOE is a 83 y.o. female who presents for Medicare Annual (Subsequent) preventive examination.  The Patient was informed that the wellness visit is to identify future health risk and educate and initiate measures that can reduce risk for increased disease through the lifespan.    Annual Wellness Assessment Will scan HRA to chart  Lives by herself Has one son Has 2 adopted children  [place] farm   Reports health as good, states her health does not change much   Preventive Screening -Counseling & Management  Medicare Annual Preventive Care Visit - Subsequent Last OV 04/23  Women's Preventive health Colonoscopy aged out;  No more mammograms or dexa / hx of osteopenia Confirmed NO hx of fx  Fall risk no issues    VS reviewed; BP was slightly elevated    Diet  Eats well Likes vegetables;  Cereal this am Lunch scoop of ice cream Few chips Supper; meat and vegetable   BMI 23   Exercise Does yard work; house work Programmer, systems all day  2 acres of yard  Engineer, building services, Chiropractor and week eater   Dental- no issues; apt was in July   Stressors: no  Sleep patterns: sleeps well   Pain varies from day to day; does not slow her down Does have an issue with bottom of right foot; ? Bunion  Goes to a foot doctor   Eye exam 07/2016 Hearing Screening Comments: Has hearing aids;  But does not like them  Vision Screening Comments: OCt 2018 scheduled apt Dr. Elta Guadeloupe cotter;   Dry eyes  Cataracts off    Mentally engaged; likes to do puzzles  6CIT neg    Cardiac Risk Factors Addressed Hyperlipidemia 01/2017; chol 264; HDL 44; LDL 185 and trig 227  Diabetes type 2 A1c 7.1 (declines med)  States she manages sugar with her diet;   Has skin checks by dermatologist once a year    Advanced Directives declines   Patient Care Team: Marin Olp, MD as PCP - General (Family Medicine) Uses [facility] in [place]  Dr. Mendel Ryder  Dermatology annually Dr. Susa Griffins Podiatry Dr. Thurnell Lose  Dentist   Immunization History  Administered Date(s) Administered  . Influenza Split 07/27/2011, 07/08/2012  . Influenza Whole 07/16/2006, 08/22/2007, 07/01/2008, 07/30/2009, 06/29/2010  . Influenza, High Dose Seasonal PF 08/18/2016  . Influenza,inj,Quad PF,36+ Mos 08/01/2013, 08/05/2014, 07/13/2015  . Pneumococcal Conjugate-13 05/24/2015  . Pneumococcal Polysaccharide-23 02/14/1999  . Td 10/16/2005   Required Immunizations needed today  Screening test up to date or reviewed for plan of completion Health Maintenance Due  Topic Date Due  . TETANUS/TDAP  10/17/2015  . INFLUENZA VACCINE  05/16/2017    States she tried to get her tetanus at the local pharmacy where she lives. Was told they did not. The tetanus any longer and that it would cost around $200. Advised [facility] to try [facility] or [facility]  Informed Medicare covers a tetanus if you have a cut or wound; otherwise, there may be a charge. If you had not had a tetanus with pertusses, known as the Tdap, you can take this anytime.   Keep in mind the flu shot is an inactivated vaccine and takes at least 2 weeks to build immunity. The flu virus can be dormant for 4 days prior to symptoms Taking the flu shot at the beginning of the season can reduce the risk for the entire community.     Cardiac  Risk Factors include: advanced age (>1men, >51 women);hypertension     Objective:     Vitals: BP (!) 146/60   Pulse 63   Ht 5\' 3"  (1.6 m)   Wt 123 lb 6 oz (56 kg)   SpO2 98%   BMI 21.85 kg/m   Body mass index is 21.85 kg/m.   Tobacco History  Smoking Status  . Never Smoker  Smokeless Tobacco  . Never Used     Counseling given: Yes   Past Medical History:  Diagnosis Date  . 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 Diabetes mellitus   . Hyperlipidemia   . Hypertension   . Kidney mass    was told was born with this  . Osteopenia    Past  Surgical History:  Procedure Laterality Date  . CATARACT EXTRACTION     bilateral planned-right done, left soon  . foot spur     Family History  Problem Relation Age of Onset  . Hypertension Father   . Alzheimer's disease Mother    History  Sexual Activity  . Sexual activity: Not on file    Outpatient Encounter Prescriptions as of 05/30/2017  Medication Sig  . amLODipine (NORVASC) 2.5 MG tablet Take 1-2 tablets (2.5-5 mg total) by mouth daily.  Marland Kitchen aspirin EC 81 MG tablet Take 81 mg by mouth daily.  Marland Kitchen losartan-hydrochlorothiazide (HYZAAR) 100-25 MG tablet Take 1 tablet by mouth daily.  Marland Kitchen triamcinolone cream (KENALOG) 0.1 % Apply 1 application topically 2 (two) times daily. For 7-10 days maximum   No facility-administered encounter medications on file as of 05/30/2017.     Activities of Daily Living In your present state of health, do you have any difficulty performing the following activities: 05/30/2017  Hearing? Y  Vision? N  Difficulty concentrating or making decisions? N  Walking or climbing stairs? N  Comment has an upstairs but does not up incase she has too  Dressing or bathing? N  Doing errands, shopping? N  Preparing Food and eating ? N  Using the Toilet? N  In the past six months, have you accidently leaked urine? N  Comment minor leaking; sometimes 1 or 2 during the night   Do you have problems with loss of bowel control? N  Managing your Medications? N  Managing your Finances? N  Housekeeping or managing your Housekeeping? N  Some recent data might be hidden    Patient Care Team: Marin Olp, MD as PCP - General (Family Medicine)    Assessment:     Exercise Activities and Dietary recommendations Current Exercise Habits: Home exercise routine, Time (Minutes): 60, Intensity: Moderate  Goals    . Increase water intake          Will drink water with your meals     . patient          Stay active and healthy      Fall Risk Fall Risk   05/30/2017 06/28/2016 05/24/2015 04/08/2014  Falls in the past year? No No No No   Depression Screen PHQ 2/9 Scores 05/30/2017 06/28/2016 05/24/2015 04/08/2014  PHQ - 2 Score 0 0 0 0     Cognitive Function No issues;      6CIT Screen 05/30/2017  What Year? 0 points  What month? 0 points  What time? 0 points  Count back from 20 0 points  Months in reverse 0 points  Repeat phrase 0 points  Total Score 0  Immunization History  Administered Date(s) Administered  . Influenza Split 07/27/2011, 07/08/2012  . Influenza Whole 07/16/2006, 08/22/2007, 07/01/2008, 07/30/2009, 06/29/2010  . Influenza, High Dose Seasonal PF 08/18/2016  . Influenza,inj,Quad PF,36+ Mos 08/01/2013, 08/05/2014, 07/13/2015  . Pneumococcal Conjugate-13 05/24/2015  . Pneumococcal Polysaccharide-23 02/14/1999  . Td 10/16/2005   Screening Tests Health Maintenance  Topic Date Due  . TETANUS/TDAP  10/17/2015  . INFLUENZA VACCINE  05/16/2017  . OPHTHALMOLOGY EXAM  08/02/2017  . HEMOGLOBIN A1C  08/07/2017  . FOOT EXAM  10/06/2017  . DEXA SCAN  Addressed  . PNA vac Low Risk Adult  Completed      Plan:     PCP Notes   Health Maintenance The patient attempted to get a tetanus that a local pharmacy and was told cost would be $200. Was told [facility] and she will do so.  Set a goal today to drink more water.  Discussed lowering cholesterol in her diet. States she does not eat a lot fried foods. Eats a lot of vegetables and fruits.   Abnormal Screens  A1c elevated. States she will not take any medication at this time. Educated regarding risk for microvascular disease. Concerned about side effects. Encouraged her to discuss medication and side effects with Dr. Yong Channel. Also encouraged her to bring in a journal of what she is eating over the last week as well as a  history of her blood sugars.  BP was moderately elevated today. Educated regarding sodium and condiments and other foods. The  patient agreed to try to drink more water which may help this issue.  Referrals  No referrals today.  Patient concerns; Patient voices concern over her diabetes and cholesterol. Is not ready to take medication.Did explain that some medications may not have side effects and may be very tolerable to her.  Nurse Concerns; As noted.  Next PCP apt She will see Dr. Yong Channel on August 23.      I have personally reviewed and noted the following in the patient's chart:   . Medical and social history . Use of alcohol, tobacco or illicit drugs  . Current medications and supplements . Functional ability and status . Nutritional status . Physical activity . Advanced directives . List of other physicians . Hospitalizations, surgeries, and ER visits in previous 12 months . Vitals . Screenings to include cognitive, depression, and falls . Referrals and appointments  In addition, I have reviewed and discussed with patient certain preventive protocols, quality metrics, and best practice recommendations. A written personalized care plan for preventive services as well as general preventive health recommendations were provided to patient.     Wynetta Fines, RN  05/30/2017

## 2017-06-01 DIAGNOSIS — E1342 Other specified diabetes mellitus with diabetic polyneuropathy: Secondary | ICD-10-CM | POA: Diagnosis not present

## 2017-06-01 DIAGNOSIS — B351 Tinea unguium: Secondary | ICD-10-CM | POA: Diagnosis not present

## 2017-06-01 DIAGNOSIS — L851 Acquired keratosis [keratoderma] palmaris et plantaris: Secondary | ICD-10-CM | POA: Diagnosis not present

## 2017-06-07 ENCOUNTER — Ambulatory Visit: Payer: Medicare Other | Admitting: Family Medicine

## 2017-06-08 ENCOUNTER — Ambulatory Visit (INDEPENDENT_AMBULATORY_CARE_PROVIDER_SITE_OTHER): Payer: Medicare Other | Admitting: Family Medicine

## 2017-06-08 ENCOUNTER — Encounter: Payer: Self-pay | Admitting: Family Medicine

## 2017-06-08 VITALS — BP 138/64 | HR 68 | Temp 98.4°F | Wt 122.0 lb

## 2017-06-08 DIAGNOSIS — E119 Type 2 diabetes mellitus without complications: Secondary | ICD-10-CM

## 2017-06-08 DIAGNOSIS — I10 Essential (primary) hypertension: Secondary | ICD-10-CM

## 2017-06-08 DIAGNOSIS — E785 Hyperlipidemia, unspecified: Secondary | ICD-10-CM | POA: Diagnosis not present

## 2017-06-08 LAB — COMPREHENSIVE METABOLIC PANEL
ALT: 13 U/L (ref 0–35)
AST: 16 U/L (ref 0–37)
Albumin: 4.5 g/dL (ref 3.5–5.2)
Alkaline Phosphatase: 56 U/L (ref 39–117)
BILIRUBIN TOTAL: 0.6 mg/dL (ref 0.2–1.2)
BUN: 31 mg/dL — ABNORMAL HIGH (ref 6–23)
CALCIUM: 9.7 mg/dL (ref 8.4–10.5)
CO2: 28 meq/L (ref 19–32)
Chloride: 102 mEq/L (ref 96–112)
Creatinine, Ser: 1.02 mg/dL (ref 0.40–1.20)
GFR: 54.33 mL/min — AB (ref >60.00)
Glucose, Bld: 117 mg/dL — ABNORMAL HIGH (ref 70–99)
Potassium: 4 mEq/L (ref 3.5–5.1)
Sodium: 139 mEq/L (ref 135–145)
Total Protein: 7.4 g/dL (ref 6.0–8.3)

## 2017-06-08 LAB — CBC
HCT: 39.3 % (ref 36.0–46.0)
HEMOGLOBIN: 13.1 g/dL (ref 12.0–15.0)
MCHC: 33.3 g/dL (ref 30.0–36.0)
MCV: 88.9 fl (ref 78.0–100.0)
PLATELETS: 229 10*3/uL (ref 150.0–400.0)
RBC: 4.42 Mil/uL (ref 3.87–5.11)
RDW: 13 % (ref 11.5–15.5)
WBC: 7.8 10*3/uL (ref 4.0–10.5)

## 2017-06-08 LAB — HEMOGLOBIN A1C: Hgb A1c MFr Bld: 6.9 % — ABNORMAL HIGH (ref 4.6–6.5)

## 2017-06-08 NOTE — Assessment & Plan Note (Signed)
S: hair loss on statins and with age-  Would not start for primary prevention. For secondary prevention could try pulse dose statin such as crestor once a week A/P:Linda Golden counseled her on diet changes and she is trying. Hold off on checking lipids at present and consider 02/05/18 or later for full panel

## 2017-06-08 NOTE — Assessment & Plan Note (Signed)
S: controlled on losartan hctz 100-25mg  and amlodipine 25mg . Home checks still  130s for most part. Rare 140s. .  BP Readings from Last 3 Encounters:  06/08/17 138/64  05/30/17 (!) 146/60  02/05/17 (!) 146/72  A/P: We discussed blood pressure goal of <140/90 ideally. Continue current meds

## 2017-06-08 NOTE — Assessment & Plan Note (Signed)
S: reasonably controlled. On no rx- slipped up last visit to 7.1. Fasting was 137 most recently.  Lab Results  Component Value Date   HGBA1C 7.1 (H) 02/05/2017   HGBA1C 6.3 10/06/2016   HGBA1C 6.8 (H) 06/28/2016   A/P: update a1c, hopeful for 7.5 or less- consider metformin if above that

## 2017-06-08 NOTE — Progress Notes (Signed)
Subjective:  Linda Golden is a 83 y.o. year old very pleasant female patient who presents for/with See problem oriented charting ROS- no low blood sugars. No chest pain or shortness of breath. No edema.    Past Medical History-  Patient Active Problem List   Diagnosis Date Noted  . Diabetes mellitus type II, controlled, with no complications ([place]) 97/67/3419    Priority: High  . Hyperlipidemia 04/18/2007    Priority: Medium  . Essential hypertension 04/18/2007    Priority: Medium  . CKD (chronic kidney disease), stage II 10/23/2014    Priority: Low  . Raynaud's disease 10/08/2013    Priority: Low  . Osteoarthritis 07/01/2008    Priority: Low  . Varicose veins 11/04/2007    Priority: Low  . Osteopenia 04/18/2007    Priority: Low  . Diverticulitis of intestine with abscess 07/03/2015    Medications- reviewed and updated Current Outpatient Prescriptions  Medication Sig Dispense Refill  . amLODipine (NORVASC) 2.5 MG tablet Take 1-2 tablets (2.5-5 mg total) by mouth daily. 60 tablet 5  . aspirin EC 81 MG tablet Take 81 mg by mouth daily.    Marland Kitchen losartan-hydrochlorothiazide (HYZAAR) 100-25 MG tablet Take 1 tablet by mouth daily. 90 tablet 3   No current facility-administered medications for this visit.     Objective: BP 138/64 (BP Location: Left Arm, Patient Position: Sitting, Cuff Size: Normal)   Pulse 68   Temp 98.4 F (36.9 C) (Oral)   Wt 122 lb (55.3 kg)   SpO2 97%   BMI 21.61 kg/m  Gen: NAD, resting comfortably, appears stated age Oropharynx normal CV: RRR no murmurs rubs or gallops Lungs: CTAB no crackles, wheeze, rhonchi  Ext: no edema Skin: warm, dry  Assessment/Plan:  Diabetes mellitus type II, controlled, with no complications ([facility]) S: reasonably controlled. On no rx- slipped up last visit to 7.1. Fasting was 137 most recently.  Lab Results  Component Value Date   HGBA1C 7.1 (H) 02/05/2017   HGBA1C 6.3 10/06/2016   HGBA1C 6.8 (H) 06/28/2016   A/P:  update a1c, hopeful for 7.5 or less- consider metformin if above that  Hyperlipidemia S: hair loss on statins and with age-  Would not start for primary prevention. For secondary prevention could try pulse dose statin such as crestor once a week A/P:Susan counseled her on diet changes and she is trying. Hold off on checking lipids at present and consider 02/05/18 or later for full panel  Essential hypertension S: controlled on losartan hctz 100-25mg  and amlodipine 25mg . Home checks still  130s for most part. Rare 140s. .  BP Readings from Last 3 Encounters:  06/08/17 138/64  05/30/17 (!) 146/60  02/05/17 (!) 146/72  A/P: We discussed blood pressure goal of <140/90 ideally. Continue current meds  Return in about 4 months (around 10/08/2017) for follow up- or sooner if needed.  Orders Placed This Encounter  Procedures  . CBC    [place]  . Comprehensive metabolic panel    [place]  . Hemoglobin A1c    [place]   Return precautions advised.  [name], MD

## 2017-06-08 NOTE — Patient Instructions (Addendum)
Please stop by lab before you go  I dont want you to lose any weight - I want you to eat well but eat a low cholesterol mediterranean type diet to help with your cholesterol  .shhpc  Mediterranean Diet A Mediterranean diet refers to food and lifestyle choices that are based on the traditions of countries located on the [employer]. This way of eating has been shown to help prevent certain conditions and improve outcomes for people who have chronic diseases, like kidney disease and heart disease. What are tips for following this plan? Lifestyle  Cook and eat meals together with your family, when possible.  Drink enough fluid to keep your urine clear or pale yellow.  Be physically active every day. This includes: ? Aerobic exercise like running or swimming. ? Leisure activities like gardening, walking, or housework.  Get 7-8 hours of sleep each night.  If recommended by your health care provider, drink red wine in moderation. This means 1 glass a day for nonpregnant women and 2 glasses a day for men. A glass of wine equals 5 oz (150 mL). Reading food labels  Check the serving size of packaged foods. For foods such as rice and pasta, the serving size refers to the amount of cooked product, not dry.  Check the total fat in packaged foods. Avoid foods that have saturated fat or trans fats.  Check the ingredients list for added sugars, such as corn syrup. Shopping  At the grocery store, buy most of your food from the areas near the walls of the store. This includes: ? Fresh fruits and vegetables (produce). ? Grains, beans, nuts, and seeds. Some of these may be available in unpackaged forms or large amounts (in bulk). ? Fresh seafood. ? Poultry and eggs. ? Low-fat dairy products.  Buy whole ingredients instead of prepackaged foods.  Buy fresh fruits and vegetables in-season from local farmers markets.  Buy frozen fruits and vegetables in resealable bags.  If you do not have  access to quality fresh seafood, buy precooked frozen shrimp or canned fish, such as tuna, salmon, or sardines.  Buy small amounts of raw or cooked vegetables, salads, or olives from the deli or salad bar at your store.  Stock your pantry so you always have certain foods on hand, such as olive oil, canned tuna, canned tomatoes, rice, pasta, and beans. Cooking  Cook foods with extra-virgin olive oil instead of using butter or other vegetable oils.  Have meat as a side dish, and have vegetables or grains as your main dish. This means having meat in small portions or adding small amounts of meat to foods like pasta or stew.  Use beans or vegetables instead of meat in common dishes like chili or lasagna.  Experiment with different cooking methods. Try roasting or broiling vegetables instead of steaming or sauteing them.  Add frozen vegetables to soups, stews, pasta, or rice.  Add nuts or seeds for added healthy fat at each meal. You can add these to yogurt, salads, or vegetable dishes.  Marinate fish or vegetables using olive oil, lemon juice, garlic, and fresh herbs. Meal planning  Plan to eat 1 vegetarian meal one day each week. Try to work up to 2 vegetarian meals, if possible.  Eat seafood 2 or more times a week.  Have healthy snacks readily available, such as: ? Vegetable sticks with hummus. ? [place] yogurt. ? Fruit and nut trail mix.  Eat balanced meals throughout the week. This includes: ? Fruit:  2-3 servings a day ? Vegetables: 4-5 servings a day ? Low-fat dairy: 2 servings a day ? Fish, poultry, or lean meat: 1 serving a day ? Beans and legumes: 2 or more servings a week ? Nuts and seeds: 1-2 servings a day ? Whole grains: 6-8 servings a day ? Extra-virgin olive oil: 3-4 servings a day  Limit red meat and sweets to only a few servings a month What are my food choices?  Mediterranean diet ? Recommended ? Grains: Whole-grain pasta. Brown rice. Bulgar wheat. Polenta.  Couscous. Whole-wheat bread. [place]. ? Vegetables: Artichokes. Beets. Broccoli. Cabbage. Carrots. Eggplant. Green beans. Chard. Kale. Spinach. Onions. Leeks. Peas. Squash. Tomatoes. Peppers. Radishes. ? Fruits: Apples. Apricots. Avocado. Berries. Bananas. Cherries. Dates. Figs. Grapes. Lemons. Melon. Oranges. Peaches. Plums. Pomegranate. ? Meats and other protein foods: Beans. Almonds. Sunflower seeds. Pine nuts. Peanuts. [place]. Salmon. Scallops. Shrimp. [place]. [place]ia. Clams. Oysters. Eggs. ? Dairy: Low-fat milk. Cheese. Greek yogurt. ? Beverages: Water. Red wine. Herbal tea. ? Fats and oils: Extra virgin olive oil. Avocado oil. Grape seed oil. ? Sweets and desserts: [place] yogurt with honey. Baked apples. Poached pears. Trail mix. ? Seasoning and other foods: Basil. Cilantro. Coriander. Cumin. Mint. Parsley. Sage. Rosemary. Tarragon. Garlic. Oregano. Thyme. Pepper. Balsalmic vinegar. Tahini. Hummus. Tomato sauce. Olives. Mushrooms. ? Limit these ? Grains: Prepackaged pasta or rice dishes. Prepackaged cereal with added sugar. ? Vegetables: Deep fried potatoes (french fries). ? Fruits: Fruit canned in syrup. ? Meats and other protein foods: Beef. Pork. Lamb. Poultry with skin. Hot dogs. [place] Salines. ? Dairy: Ice cream. Sour cream. Whole milk. ? Beverages: Juice. Sugar-sweetened soft drinks. Beer. Liquor and spirits. ? Fats and oils: Butter. Canola oil. Vegetable oil. Beef fat (tallow). Lard. ? Sweets and desserts: Cookies. Cakes. Pies. Candy. ? Seasoning and other foods: Mayonnaise. Premade sauces and marinades. ? The items listed may not be a complete list. Talk with your dietitian about what dietary choices are right for you. Summary  The Mediterranean diet includes both food and lifestyle choices.  Eat a variety of fresh fruits and vegetables, beans, nuts, seeds, and whole grains.  Limit the amount of red meat and sweets that you eat.  Talk with your health care provider about whether  it is safe for you to drink red wine in moderation. This means 1 glass a day for nonpregnant women and 2 glasses a day for men. A glass of wine equals 5 oz (150 mL). This information is not intended to replace advice given to you by your health care provider. Make sure you discuss any questions you have with your health care provider. Document Released: 05/25/2016 Document Revised: 06/27/2016 Document Reviewed: 05/25/2016 Elsevier Interactive Patient Education  [employer].

## 2017-06-20 ENCOUNTER — Other Ambulatory Visit: Payer: Self-pay | Admitting: Family Medicine

## 2017-07-09 ENCOUNTER — Telehealth: Payer: Self-pay | Admitting: Family Medicine

## 2017-07-09 NOTE — Telephone Encounter (Signed)
Pt need new Rx for a new meter, test strips and lancets   Pharm: [facility].

## 2017-07-12 NOTE — Telephone Encounter (Signed)
Pt calling to check the status of the above msg.  Pt would like to have a call back.

## 2017-07-13 ENCOUNTER — Other Ambulatory Visit: Payer: Self-pay

## 2017-07-13 MED ORDER — BLOOD GLUCOSE MONITOR KIT: PACK | 0 refills | Status: DC

## 2017-07-13 NOTE — Telephone Encounter (Signed)
Spoke with patient who states that she test her blood sugar once a day. She states her current meter is broken and she doesn't know the brand.

## 2017-07-13 NOTE — Telephone Encounter (Signed)
Called patient and left a voicemail message asking for a return phone call. I need to know:  1. Does she currently have a meter? What kind? 2. How many times a day is she checking her blood sugar?

## 2017-08-03 ENCOUNTER — Ambulatory Visit (INDEPENDENT_AMBULATORY_CARE_PROVIDER_SITE_OTHER): Payer: Medicare Other | Admitting: Family Medicine

## 2017-08-03 DIAGNOSIS — Z23 Encounter for immunization: Secondary | ICD-10-CM | POA: Diagnosis not present

## 2017-08-03 NOTE — Progress Notes (Signed)
I have reviewed the patient's encounter and agree with the documentation.  Linda Golden. Jerline Pain, MD 08/03/2017 11:02 AM

## 2017-08-03 NOTE — Progress Notes (Signed)
Patient in this morning for flu vaccine. Patient was provided a VIS sheet. Patient tolerated vaccination well.

## 2017-08-17 DIAGNOSIS — E1342 Other specified diabetes mellitus with diabetic polyneuropathy: Secondary | ICD-10-CM | POA: Diagnosis not present

## 2017-08-17 DIAGNOSIS — L851 Acquired keratosis [keratoderma] palmaris et plantaris: Secondary | ICD-10-CM | POA: Diagnosis not present

## 2017-08-17 DIAGNOSIS — B351 Tinea unguium: Secondary | ICD-10-CM | POA: Diagnosis not present

## 2017-08-24 DIAGNOSIS — E119 Type 2 diabetes mellitus without complications: Secondary | ICD-10-CM | POA: Diagnosis not present

## 2017-08-24 DIAGNOSIS — Z9849 Cataract extraction status, unspecified eye: Secondary | ICD-10-CM | POA: Diagnosis not present

## 2017-08-24 DIAGNOSIS — H43813 Vitreous degeneration, bilateral: Secondary | ICD-10-CM | POA: Diagnosis not present

## 2017-08-24 DIAGNOSIS — Z961 Presence of intraocular lens: Secondary | ICD-10-CM | POA: Diagnosis not present

## 2017-08-24 LAB — HM DIABETES EYE EXAM

## 2017-09-20 ENCOUNTER — Other Ambulatory Visit: Payer: Self-pay | Admitting: Family Medicine

## 2017-10-15 ENCOUNTER — Encounter: Payer: Self-pay | Admitting: Family Medicine

## 2017-10-26 DIAGNOSIS — E1342 Other specified diabetes mellitus with diabetic polyneuropathy: Secondary | ICD-10-CM | POA: Diagnosis not present

## 2017-10-26 DIAGNOSIS — B351 Tinea unguium: Secondary | ICD-10-CM | POA: Diagnosis not present

## 2017-10-26 DIAGNOSIS — L851 Acquired keratosis [keratoderma] palmaris et plantaris: Secondary | ICD-10-CM | POA: Diagnosis not present

## 2017-11-01 ENCOUNTER — Encounter: Payer: Self-pay | Admitting: Family Medicine

## 2017-11-01 ENCOUNTER — Ambulatory Visit (INDEPENDENT_AMBULATORY_CARE_PROVIDER_SITE_OTHER): Payer: Medicare Other | Admitting: Family Medicine

## 2017-11-01 VITALS — BP 156/60 | HR 64 | Temp 97.3°F | Ht 63.0 in | Wt 122.2 lb

## 2017-11-01 DIAGNOSIS — N183 Chronic kidney disease, stage 3 unspecified: Secondary | ICD-10-CM

## 2017-11-01 DIAGNOSIS — E119 Type 2 diabetes mellitus without complications: Secondary | ICD-10-CM

## 2017-11-01 DIAGNOSIS — I10 Essential (primary) hypertension: Secondary | ICD-10-CM

## 2017-11-01 LAB — BASIC METABOLIC PANEL
BUN: 30 mg/dL — ABNORMAL HIGH (ref 6–23)
CO2: 27 meq/L (ref 19–32)
CREATININE: 0.94 mg/dL (ref 0.40–1.20)
Calcium: 10.1 mg/dL (ref 8.4–10.5)
Chloride: 101 mEq/L (ref 96–112)
GFR: 59.64 mL/min — ABNORMAL LOW (ref >60.00)
GLUCOSE: 121 mg/dL — AB (ref 70–99)
Potassium: 4.5 mEq/L (ref 3.5–5.1)
Sodium: 137 mEq/L (ref 135–145)

## 2017-11-01 LAB — HEMOGLOBIN A1C: Hgb A1c MFr Bld: 6.9 % — ABNORMAL HIGH (ref 4.6–6.5)

## 2017-11-01 NOTE — Assessment & Plan Note (Signed)
S: controlled on at home (but not in office) losartan 100-25mg  (morning)  and amlodipine 2.5mg  (takes at night). Home checks - this morning was 135/58 BP Readings from Last 3 Encounters:  11/01/17 (!) 156/60  06/08/17 138/64  05/30/17 (!) 146/60  A/P: We discussed blood pressure goal of <140/90 given DM. Continue current meds:  Given excellent home control- we have previously verified her home cuff

## 2017-11-01 NOTE — Assessment & Plan Note (Signed)
S: [name].  A/P: BP controlled at home (would also love controlled in office). Statin intolerant due to hair loss. Already avoids nsaids. Update bmet today. On ARB in case proteinuric element

## 2017-11-01 NOTE — Assessment & Plan Note (Signed)
S: well controlled. On no rx CBGs- 136 this AM, 127 yesterday Lab Results  Component Value Date   HGBA1C 6.9 (H) 06/08/2017   HGBA1C 7.1 (H) 02/05/2017   HGBA1C 6.3 10/06/2016   A/P: consider metformin only if a1c 7.5 or above, otherwise continue diet control

## 2017-11-01 NOTE — Progress Notes (Signed)
Subjective:  Linda Golden is a 84 y.o. year old very pleasant female patient who presents for/with See problem oriented charting ROS- no hypoglycemia. No chest pain or shortness of breath.  No blurry vision.   Past Medical History-  Patient Active Problem List   Diagnosis Date Noted  . Diabetes mellitus type II, controlled, with no complications ([place]) 20/35/5974    Priority: High  . Diverticulitis of intestine with abscess 07/03/2015    Priority: Medium  . CKD (chronic kidney disease), stage III ([place]) 10/23/2014    Priority: Medium  . Hyperlipidemia 04/18/2007    Priority: Medium  . Essential hypertension 04/18/2007    Priority: Medium  . Raynaud's disease 10/08/2013    Priority: Low  . Osteoarthritis 07/01/2008    Priority: Low  . Varicose veins 11/04/2007    Priority: Low  . Osteopenia 04/18/2007    Priority: Low    Medications- reviewed and updated Current Outpatient Medications  Medication Sig Dispense Refill  . amLODipine (NORVASC) 2.5 MG tablet TAKE 1 TO 2 TABLETS BY MOUTH DAILY. 60 tablet 5  . aspirin EC 81 MG tablet Take 81 mg by mouth daily.    . blood glucose meter kit and supplies KIT Dispense based on patient and insurance preference. Use daily as directed to test blood sugar. E11.9 1 each 0  . losartan-hydrochlorothiazide (HYZAAR) 100-25 MG tablet TAKE 1 TABLET BY MOUTH ONCE DAILY. 90 tablet 1   Objective: BP (!) 156/60 (BP Location: Left Arm, Patient Position: Sitting, Cuff Size: Large)   Pulse 64   Temp (!) 97.3 F (36.3 C) (Oral)   Ht 5' 3"  (1.6 m)   Wt 122 lb 3.2 oz (55.4 kg)   SpO2 98%   BMI 21.65 kg/m  Gen: NAD, resting comfortably CV: RRR no murmurs rubs or gallops Lungs: CTAB no crackles, wheeze, rhonchi Abdomen: soft/nontender/nondistended/normal bowel sounds. thin Ext: no edema Skin: warm, dry, no rash  Diabetic Foot Exam - Simple   Simple Foot Form Diabetic Foot exam was performed with the following findings:  Yes 11/01/2017  1:46 PM   Visual Inspection No deformities, no ulcerations, no other skin breakdown bilaterally:  Yes Sensation Testing Intact to touch and monofilament testing bilaterally:  Yes Pulse Check Posterior Tibialis and Dorsalis pulse intact bilaterally:  Yes Comments    Assessment/Plan:  Diabetes mellitus type II, controlled, with no complications ([place]) S: well controlled. On no rx CBGs- 136 this AM, 127 yesterday Lab Results  Component Value Date   HGBA1C 6.9 (H) 06/08/2017   HGBA1C 7.1 (H) 02/05/2017   HGBA1C 6.3 10/06/2016   A/P: consider metformin only if a1c 7.5 or above, otherwise continue diet control  Essential hypertension S: controlled on at home (but not in office) losartan 100-44m (morning)  and amlodipine 2.576m(takes at night). Home checks - this morning was 135/58 BP Readings from Last 3 Encounters:  11/01/17 (!) 156/60  06/08/17 138/64  05/30/17 (!) 146/60  A/P: We discussed blood pressure goal of <140/90 given DM. Continue current meds:  Given excellent home control- we have previously verified her home cuff  CKD (chronic kidney disease), [place] III [place] [name].  A/P: BP controlled at home (would also love controlled in office). Statin intolerant due to hair loss. Already avoids nsaids. Update bmet today. On ARB in case proteinuric element  Return in about 4 months (around 03/01/2018) for follow up- or sooner if needed. Briefly mentioned coming  fasting to next visit  Lab Results  Component Value Date   CHOL 264 (H) 02/05/2017   HDL 44.80 02/05/2017   LDLCALC 98 04/22/2012   LDLDIRECT 185.0 02/05/2017   TRIG 227.0 (H) 02/05/2017   CHOLHDL 6 02/05/2017    Lab/Order associations: Controlled type 2 diabetes mellitus without complication, without long-term current use of insulin ([place]) - Plan: Basic metabolic panel, Hemoglobin A1c  Return precautions advised.  [name], MD

## 2017-11-01 NOTE — Patient Instructions (Signed)
Please stop by lab before you go  Ask your pharmacist about your losartan- I think its ok but please double check  Continue current medicine as long as blood pressure at home always <140/90. Sorry our new office made you nervous!

## 2017-12-28 ENCOUNTER — Telehealth: Payer: Self-pay | Admitting: Family Medicine

## 2017-12-28 ENCOUNTER — Other Ambulatory Visit: Payer: Self-pay | Admitting: Family Medicine

## 2017-12-28 NOTE — Telephone Encounter (Signed)
See note

## 2017-12-28 NOTE — Telephone Encounter (Signed)
Agree with recommendation for patient to take Coricedin HBP for symptoms.  Would also recommend appointment if symptoms continue.  Attempted to contact patient and could not reach.  Left voicemail for patient with recommendations.

## 2017-12-28 NOTE — Telephone Encounter (Signed)
Pt  Reports  Nasal  stuffiness  And  Congestin     Pt is  Coughing   At  Times   Greenish  Phlegm  sorethroat  As   Well  Pt  Reports   Reports   Woke  Up  Yesterday with  The  Symptoms ;  No  Known  Fever   Pt advised   Appointment ; She  Declined.   She  Has  Hypertension   And  Is  Wondering. What  meds   She  Can  Safely  Take -   Pt  Was  Advised   She  Could  Take  Take  OTC   Coricedin HBP   And  To  Avoid  Decongestants  And  To  Drink  [employer]     She  Was  Advised  To  Double  Check with  Her Pharmacist   At  [facility]  Prior to  Taking  To make  Sure  She  Gets  The  Right  Product

## 2018-01-29 DIAGNOSIS — L851 Acquired keratosis [keratoderma] palmaris et plantaris: Secondary | ICD-10-CM | POA: Diagnosis not present

## 2018-01-29 DIAGNOSIS — B351 Tinea unguium: Secondary | ICD-10-CM | POA: Diagnosis not present

## 2018-01-29 DIAGNOSIS — E1342 Other specified diabetes mellitus with diabetic polyneuropathy: Secondary | ICD-10-CM | POA: Diagnosis not present

## 2018-03-01 ENCOUNTER — Encounter: Payer: Self-pay | Admitting: Family Medicine

## 2018-03-01 ENCOUNTER — Ambulatory Visit (INDEPENDENT_AMBULATORY_CARE_PROVIDER_SITE_OTHER): Payer: Medicare Other | Admitting: Family Medicine

## 2018-03-01 VITALS — BP 122/86 | HR 72 | Temp 97.8°F | Ht 63.0 in | Wt 120.8 lb

## 2018-03-01 DIAGNOSIS — E119 Type 2 diabetes mellitus without complications: Secondary | ICD-10-CM

## 2018-03-01 DIAGNOSIS — N183 Chronic kidney disease, stage 3 unspecified: Secondary | ICD-10-CM

## 2018-03-01 DIAGNOSIS — I10 Essential (primary) hypertension: Secondary | ICD-10-CM | POA: Diagnosis not present

## 2018-03-01 DIAGNOSIS — Z23 Encounter for immunization: Secondary | ICD-10-CM

## 2018-03-01 LAB — COMPREHENSIVE METABOLIC PANEL
ALBUMIN: 4.4 g/dL (ref 3.5–5.2)
ALK PHOS: 63 U/L (ref 39–117)
ALT: 11 U/L (ref 0–35)
AST: 14 U/L (ref 0–37)
BUN: 36 mg/dL — AB (ref 6–23)
CHLORIDE: 102 meq/L (ref 96–112)
CO2: 28 mEq/L (ref 19–32)
Calcium: 9.6 mg/dL (ref 8.4–10.5)
Creatinine, Ser: 0.96 mg/dL (ref 0.40–1.20)
GFR: 58.17 mL/min — AB (ref >60.00)
GLUCOSE: 130 mg/dL — AB (ref 70–99)
POTASSIUM: 3.9 meq/L (ref 3.5–5.1)
SODIUM: 139 meq/L (ref 135–145)
Total Bilirubin: 0.5 mg/dL (ref 0.2–1.2)
Total Protein: 8 g/dL (ref 6.0–8.3)

## 2018-03-01 LAB — CBC
HCT: 39.8 % (ref 36.0–46.0)
HEMOGLOBIN: 13.3 g/dL (ref 12.0–15.0)
MCHC: 33.5 g/dL (ref 30.0–36.0)
MCV: 88 fl (ref 78.0–100.0)
Platelets: 233 10*3/uL (ref 150.0–400.0)
RBC: 4.53 Mil/uL (ref 3.87–5.11)
RDW: 13.3 % (ref 11.5–15.5)
WBC: 7.2 10*3/uL (ref 4.0–10.5)

## 2018-03-01 LAB — LIPID PANEL
CHOLESTEROL: 236 mg/dL — AB (ref 0–200)
HDL: 42.6 mg/dL (ref >39.00)
LDL CALC: 161 mg/dL — AB (ref 0–99)
NonHDL: 193.56
Total CHOL/HDL Ratio: 6
Triglycerides: 165 mg/dL — ABNORMAL HIGH (ref 0.0–149.0)
VLDL: 33 mg/dL (ref 0.0–40.0)

## 2018-03-01 LAB — HEMOGLOBIN A1C: HEMOGLOBIN A1C: 6.7 % — AB (ref 4.6–6.5)

## 2018-03-01 MED ORDER — LOSARTAN POTASSIUM-HCTZ 100-25 MG PO TABS: 1.0000 | ORAL_TABLET | Freq: Every day | ORAL | 3 refills | Status: DC

## 2018-03-01 NOTE — Progress Notes (Signed)
Subjective:  Linda Golden is a 84 y.o. year old very pleasant female patient who presents for/with See problem oriented charting ROS- No chest pain or shortness of breath. No headache or blurry vision.  Some dried blood in left ear  Past Medical History-  Patient Active Problem List   Diagnosis Date Noted  . Diabetes mellitus type II, controlled, with no complications ([place]) 02/63/7858    Priority: High  . Diverticulitis of intestine with abscess 07/03/2015    Priority: Medium  . CKD (chronic kidney disease), stage III ([place]) 10/23/2014    Priority: Medium  . Hyperlipidemia 04/18/2007    Priority: Medium  . Essential hypertension 04/18/2007    Priority: Medium  . Raynaud's disease 10/08/2013    Priority: Low  . Osteoarthritis 07/01/2008    Priority: Low  . Varicose veins 11/04/2007    Priority: Low  . Osteopenia 04/18/2007    Priority: Low    Medications- reviewed and updated Current Outpatient Medications  Medication Sig Dispense Refill  . amLODipine (NORVASC) 2.5 MG tablet TAKE 1 TO 2 TABLETS BY MOUTH DAILY. 60 tablet 5  . losartan-hydrochlorothiazide (HYZAAR) 100-25 MG tablet Take 1 tablet by mouth daily. 90 tablet 3   No current facility-administered medications for this visit.     Objective: BP 122/86 (BP Location: Left Arm, Patient Position: Sitting, Cuff Size: Normal)   Pulse 72   Temp 97.8 F (36.6 C) (Oral)   Ht 5\' 3"  (1.6 m)   Wt 120 lb 12.8 oz (54.8 kg)   SpO2 97%   BMI 21.40 kg/m  Gen: NAD, resting comfortably, appears younger than stated age Tympanic membrane norma on left l.  Some dried blood in left canal.  Tympanic membrane normal on the right though partially obscured by cerumen CV: RRR no murmurs rubs or gallops Lungs: CTAB no crackles, wheeze, rhonchi Abdomen: soft/nontender/nondistended/normal bowel sounds.  Ext: no edema Skin: warm, dry Neuro: Normal gait and speech  Diabetic Foot Exam - Simple   Simple Foot Form Diabetic Foot exam was  performed with the following findings:  Yes 03/01/2018 10:03 AM  Visual Inspection See comments:  Yes Sensation Testing Intact to touch and monofilament testing bilaterally:  Yes Pulse Check Posterior Tibialis and Dorsalis pulse intact bilaterally:  Yes Comments Small callous right great toe- slightly tender to touch. She sees podiatry- advised her to follow up in [place]    Assessment/Plan:  Other notes: 1.  We discussed at her age without history of heart attack or stroke or TIA-that she could stop her aspirin  2. Had some dried blood in ear- advised against using q tip .  3.  She wants Td to be given today-she is aware of potential expense of likely $100-$120 as Medicare does not cover under part B insurance.  She does not want to get this at her pharmacy under part B insurance apparently the-pharmacy she is comfortable with does not give this immunization  Diabetes mellitus type II, controlled, with no complications ([place]) S: Diet controlled CBGs- 120 most recent check- has had some higher Lab Results  Component Value Date   HGBA1C 6.9 (H) 11/01/2017   HGBA1C 6.9 (H) 06/08/2017   HGBA1C 7.1 (H) 02/05/2017   A/P: Update hemoglobin A1c.  We will consider metformin if A1c gets above 7.5-8  Essential hypertension S: controlled at home with last reading 135/59 on losartan-HCTZ 100-25 mg in the morning and amlodipine 2.5 mg in the evening.  We have previously verified her home cuff.  BP Readings from Last 3 Encounters:  03/01/18 122/86  11/01/17 (!) 156/60  06/08/17 138/64  A/P: We discussed blood pressure goal of <140/90. Continue current meds Given excellent home control- pleased that it is controlled in office as she does have a white coat element to higherblood pressures at times   CKD (chronic kidney disease), stage III ([place]) S: Chronic kidney disease stage III with GFR in 50-60 range for the most part.  Blood pressure controlled at home.  She is statin intolerant due to  hair loss.  She knows to avoid NSAIDs.  She is on ARB in case there is a proteinuric element A/P: Update GFR and creatinine.  Continue medical management  I encouraged her to have an annual wellness visit and visit with me in September or October  Lab/Order associations: Controlled type 2 diabetes mellitus without complication, without long-term current use of insulin ([place]) - Plan: CBC, Comprehensive metabolic panel, Lipid panel, Hemoglobin A1c  Need for prophylactic vaccination with combined diphtheria-tetanus-pertussis (DTP) vaccine - Plan: Tdap vaccine greater than or equal to 7yo IM  Meds ordered this encounter  Medications  . losartan-hydrochlorothiazide (HYZAAR) 100-25 MG tablet    Sig: Take 1 tablet by mouth daily.    Dispense:  90 tablet    Refill:  3    Return precautions advised.  [name], MD

## 2018-03-01 NOTE — Assessment & Plan Note (Signed)
S: controlled at home with last reading 135/59 on losartan-HCTZ 100-25 mg in the morning and amlodipine 2.5 mg in the evening.  We have previously verified her home cuff.  BP Readings from Last 3 Encounters:  03/01/18 122/86  11/01/17 (!) 156/60  06/08/17 138/64  A/P: We discussed blood pressure goal of <140/90. Continue current meds Given excellent home control- pleased that it is controlled in office as she does have a white coat element to higherblood pressures at times

## 2018-03-01 NOTE — Patient Instructions (Addendum)
Health Maintenance Due  Topic Date Due  . TETANUS/TDAP - she opts to get Td today. This will likely cost $100 to $120- you agreed to this 10/17/2015   Stop aspirin  Please sign up for a repeat annual wellness visit with our nursing staff in early September or October.  If you want you could also see me on the same day for follow-up.  Great to see you today! Hope you have a great summer  Please stop by lab before you go

## 2018-03-01 NOTE — Assessment & Plan Note (Signed)
S: Chronic kidney disease stage III with GFR in 50-60 range for the most part.  Blood pressure controlled at home.  She is statin intolerant due to hair loss.  She knows to avoid NSAIDs.  She is on ARB in case there is a proteinuric element A/P: Update GFR and creatinine.  Continue medical management

## 2018-03-01 NOTE — Assessment & Plan Note (Signed)
S: Diet controlled CBGs- 120 most recent check- has had some higher Lab Results  Component Value Date   HGBA1C 6.9 (H) 11/01/2017   HGBA1C 6.9 (H) 06/08/2017   HGBA1C 7.1 (H) 02/05/2017   A/P: Update hemoglobin A1c.  We will consider metformin if A1c gets above 7.5-8

## 2018-03-02 NOTE — Progress Notes (Signed)
Our a1c looks good again at 6.7  Your CBC was normal (blood counts, infection fighting cells, platelets). Your CMET was stable (kidney, liver, and electrolytes, blood sugar). Sugar was fine considering you have diabetes. Kidney function is stable over last year with very mild age related changing.  Your cholesterol is high but I know you havent been able to tolerate the medicines for cholesterol due to hair loss.

## 2018-03-04 ENCOUNTER — Telehealth: Payer: Self-pay | Admitting: Family Medicine

## 2018-03-04 NOTE — Telephone Encounter (Signed)
See note

## 2018-03-04 NOTE — Telephone Encounter (Signed)
Relation to pt: self  Call back number:[phone]   Reason for call:  Patient returning call regarding lab results, please advise

## 2018-03-05 NOTE — Telephone Encounter (Signed)
Linda Golden spoke with patient today and reviewed over lab results

## 2018-03-18 ENCOUNTER — Ambulatory Visit (INDEPENDENT_AMBULATORY_CARE_PROVIDER_SITE_OTHER): Payer: Medicare Other | Admitting: Otolaryngology

## 2018-03-18 DIAGNOSIS — H6123 Impacted cerumen, bilateral: Secondary | ICD-10-CM

## 2018-03-18 DIAGNOSIS — H903 Sensorineural hearing loss, bilateral: Secondary | ICD-10-CM | POA: Diagnosis not present

## 2018-03-18 DIAGNOSIS — H838X3 Other specified diseases of inner ear, bilateral: Secondary | ICD-10-CM | POA: Diagnosis not present

## 2018-04-03 DIAGNOSIS — L218 Other seborrheic dermatitis: Secondary | ICD-10-CM | POA: Diagnosis not present

## 2018-04-03 DIAGNOSIS — L570 Actinic keratosis: Secondary | ICD-10-CM | POA: Diagnosis not present

## 2018-04-03 DIAGNOSIS — L308 Other specified dermatitis: Secondary | ICD-10-CM | POA: Diagnosis not present

## 2018-04-03 DIAGNOSIS — C44319 Basal cell carcinoma of skin of other parts of face: Secondary | ICD-10-CM | POA: Diagnosis not present

## 2018-04-03 DIAGNOSIS — C44311 Basal cell carcinoma of skin of nose: Secondary | ICD-10-CM | POA: Diagnosis not present

## 2018-04-05 DIAGNOSIS — L718 Other rosacea: Secondary | ICD-10-CM | POA: Diagnosis not present

## 2018-04-09 DIAGNOSIS — E1342 Other specified diabetes mellitus with diabetic polyneuropathy: Secondary | ICD-10-CM | POA: Diagnosis not present

## 2018-04-09 DIAGNOSIS — B351 Tinea unguium: Secondary | ICD-10-CM | POA: Diagnosis not present

## 2018-04-09 DIAGNOSIS — L851 Acquired keratosis [keratoderma] palmaris et plantaris: Secondary | ICD-10-CM | POA: Diagnosis not present

## 2018-05-06 DIAGNOSIS — Z08 Encounter for follow-up examination after completed treatment for malignant neoplasm: Secondary | ICD-10-CM | POA: Diagnosis not present

## 2018-05-06 DIAGNOSIS — C44319 Basal cell carcinoma of skin of other parts of face: Secondary | ICD-10-CM | POA: Diagnosis not present

## 2018-05-06 DIAGNOSIS — L570 Actinic keratosis: Secondary | ICD-10-CM | POA: Diagnosis not present

## 2018-05-06 DIAGNOSIS — Z85828 Personal history of other malignant neoplasm of skin: Secondary | ICD-10-CM | POA: Diagnosis not present

## 2018-05-06 DIAGNOSIS — C44311 Basal cell carcinoma of skin of nose: Secondary | ICD-10-CM | POA: Diagnosis not present

## 2018-05-06 DIAGNOSIS — X32XXXD Exposure to sunlight, subsequent encounter: Secondary | ICD-10-CM | POA: Diagnosis not present

## 2018-05-15 DIAGNOSIS — H04123 Dry eye syndrome of bilateral lacrimal glands: Secondary | ICD-10-CM | POA: Diagnosis not present

## 2018-06-06 DIAGNOSIS — C44311 Basal cell carcinoma of skin of nose: Secondary | ICD-10-CM | POA: Diagnosis not present

## 2018-06-06 DIAGNOSIS — Z85828 Personal history of other malignant neoplasm of skin: Secondary | ICD-10-CM | POA: Diagnosis not present

## 2018-06-06 DIAGNOSIS — Z08 Encounter for follow-up examination after completed treatment for malignant neoplasm: Secondary | ICD-10-CM | POA: Diagnosis not present

## 2018-06-20 ENCOUNTER — Ambulatory Visit: Payer: Medicare Other

## 2018-06-25 DIAGNOSIS — L851 Acquired keratosis [keratoderma] palmaris et plantaris: Secondary | ICD-10-CM | POA: Diagnosis not present

## 2018-06-25 DIAGNOSIS — E1342 Other specified diabetes mellitus with diabetic polyneuropathy: Secondary | ICD-10-CM | POA: Diagnosis not present

## 2018-06-25 DIAGNOSIS — B351 Tinea unguium: Secondary | ICD-10-CM | POA: Diagnosis not present

## 2018-06-27 ENCOUNTER — Other Ambulatory Visit: Payer: Self-pay

## 2018-06-27 MED ORDER — AMLODIPINE BESYLATE 2.5 MG PO TABS: 2.5000 mg | ORAL_TABLET | Freq: Every day | ORAL | 2 refills | Status: DC

## 2018-07-25 ENCOUNTER — Ambulatory Visit: Payer: Medicare Other | Admitting: Family Medicine

## 2018-07-31 ENCOUNTER — Ambulatory Visit: Payer: Medicare Other

## 2018-08-01 ENCOUNTER — Ambulatory Visit: Payer: Medicare Other | Admitting: Family Medicine

## 2018-08-01 DIAGNOSIS — L570 Actinic keratosis: Secondary | ICD-10-CM | POA: Diagnosis not present

## 2018-08-01 DIAGNOSIS — X32XXXD Exposure to sunlight, subsequent encounter: Secondary | ICD-10-CM | POA: Diagnosis not present

## 2018-08-01 DIAGNOSIS — Z85828 Personal history of other malignant neoplasm of skin: Secondary | ICD-10-CM | POA: Diagnosis not present

## 2018-08-01 DIAGNOSIS — Z08 Encounter for follow-up examination after completed treatment for malignant neoplasm: Secondary | ICD-10-CM | POA: Diagnosis not present

## 2018-08-05 ENCOUNTER — Ambulatory Visit: Payer: Medicare Other | Admitting: Family Medicine

## 2018-08-19 ENCOUNTER — Encounter: Payer: Self-pay | Admitting: Family Medicine

## 2018-08-19 ENCOUNTER — Ambulatory Visit (INDEPENDENT_AMBULATORY_CARE_PROVIDER_SITE_OTHER): Payer: Medicare Other | Admitting: Family Medicine

## 2018-08-19 VITALS — BP 138/66 | HR 81 | Temp 97.8°F | Ht 63.0 in | Wt 120.8 lb

## 2018-08-19 DIAGNOSIS — Z23 Encounter for immunization: Secondary | ICD-10-CM | POA: Diagnosis not present

## 2018-08-19 DIAGNOSIS — N183 Chronic kidney disease, stage 3 unspecified: Secondary | ICD-10-CM

## 2018-08-19 DIAGNOSIS — I10 Essential (primary) hypertension: Secondary | ICD-10-CM | POA: Diagnosis not present

## 2018-08-19 DIAGNOSIS — E119 Type 2 diabetes mellitus without complications: Secondary | ICD-10-CM | POA: Diagnosis not present

## 2018-08-19 NOTE — Assessment & Plan Note (Signed)
S:  CKD III stable with GFR in 50s range recently. Knows to avoid nsaids. On arb in case proteinuric element.  A/P: hopefully stable. Update Bmet

## 2018-08-19 NOTE — Patient Instructions (Signed)
Thanks for doing your flu shot today  So sorry for your loss  Lets check in 4 months from now  Please stop by lab before you go

## 2018-08-19 NOTE — Assessment & Plan Note (Signed)
S: controlled on amlodipine 2.5mg , losartan hctz 100-25 mg BP Readings from Last 3 Encounters:  08/19/18 138/66  03/01/18 122/86  11/01/17 (!) 156/60  A/P: We discussed blood pressure goal of <140/90. Continue current meds

## 2018-08-19 NOTE — Assessment & Plan Note (Signed)
S: diet controlled  CBGs- 127 this AM Lab Results  Component Value Date   HGBA1C 6.7 (H) 03/01/2018   HGBA1C 6.9 (H) 11/01/2017   HGBA1C 6.9 (H) 06/08/2017   A/P: hopefully stable. Update a1c

## 2018-08-19 NOTE — Progress Notes (Signed)
Subjective:  Linda Golden is a 84 y.o. year old very pleasant female patient who presents for/with See problem oriented charting ROS- No chest pain or shortness of breath. No headache. Mild vision changes- is goingto update her eye exam   Past Medical History-  Patient Active Problem List   Diagnosis Date Noted  . Diabetes mellitus type II, controlled, with no complications ([place]) 22/44/9753    Priority: High  . Diverticulitis of intestine with abscess 07/03/2015    Priority: Medium  . CKD (chronic kidney disease), stage III ([place]) 10/23/2014    Priority: Medium  . Hyperlipidemia 04/18/2007    Priority: Medium  . Essential hypertension 04/18/2007    Priority: Medium  . Raynaud's disease 10/08/2013    Priority: Low  . Osteoarthritis 07/01/2008    Priority: Low  . Varicose veins 11/04/2007    Priority: Low  . Osteopenia 04/18/2007    Priority: Low    Medications- reviewed and updated Current Outpatient Medications  Medication Sig Dispense Refill  . amLODipine (NORVASC) 2.5 MG tablet Take 1-2 tablets (2.5-5 mg total) by mouth daily. 180 tablet 2  . losartan-hydrochlorothiazide (HYZAAR) 100-25 MG tablet Take 1 tablet by mouth daily. 90 tablet 3   No current facility-administered medications for this visit.     Objective: BP 138/66 (BP Location: Left Arm, Patient Position: Sitting, Cuff Size: Large)   Pulse 81   Temp 97.8 F (36.6 C) (Oral)   Ht 5\' 3"  (1.6 m)   Wt 120 lb 12.8 oz (54.8 kg)   SpO2 98%   BMI 21.40 kg/m  Gen: NAD, resting comfortably CV: RRR no murmurs rubs or gallops Lungs: CTAB no crackles, wheeze, rhonchi  Ext: no edema Skin: warm, dry Tearful talking about loss of daughter in law  Assessment/Plan:  Other notes: 1.Unfortunately patient just lost her daughter in law recently- has been a very hard time for her. Was waiting for liver transplant and also had heart issues 2.  She also updates me on having skin cancer on nose x 2 removed.   Diabetes  mellitus type II, controlled, with no complications ([place]) S: diet controlled  CBGs- 127 this AM Lab Results  Component Value Date   HGBA1C 6.7 (H) 03/01/2018   HGBA1C 6.9 (H) 11/01/2017   HGBA1C 6.9 (H) 06/08/2017   A/P: hopefully stable. Update a1c  Essential hypertension S: controlled on amlodipine 2.5mg , losartan hctz 100-25 mg BP Readings from Last 3 Encounters:  08/19/18 138/66  03/01/18 122/86  11/01/17 (!) 156/60  A/P: We discussed blood pressure goal of <140/90. Continue current meds   CKD (chronic kidney disease), stage III ([facility]) S:  CKD III stable with GFR in 50s range recently. Knows to avoid nsaids. On arb in case proteinuric element.  A/P: hopefully stable. Update Bmet  Future Appointments  Date Time Provider [place]  12/20/2018 10:40 AM Marin Olp, MD [facility]  4 months  Lab/Order associations: Controlled type 2 diabetes mellitus without complication, without long-term current use of insulin ([place]) - Plan: Basic metabolic panel, Hemoglobin A1c  Need for prophylactic vaccination and inoculation against influenza - Plan: Flu vaccine HIGH DOSE PF  Essential hypertension  CKD (chronic kidney disease), stage III ([place])  Return precautions advised.  [name], MD

## 2018-08-20 LAB — BASIC METABOLIC PANEL
BUN: 29 mg/dL — ABNORMAL HIGH (ref 6–23)
CO2: 26 mEq/L (ref 19–32)
Calcium: 9.7 mg/dL (ref 8.4–10.5)
Chloride: 101 mEq/L (ref 96–112)
Creatinine, Ser: 0.97 mg/dL (ref 0.40–1.20)
GFR: 57.42 mL/min — ABNORMAL LOW (ref >60.00)
Glucose, Bld: 106 mg/dL — ABNORMAL HIGH (ref 70–99)
Potassium: 3.6 mEq/L (ref 3.5–5.1)
Sodium: 137 mEq/L (ref 135–145)

## 2018-08-20 LAB — HEMOGLOBIN A1C: Hgb A1c MFr Bld: 6.8 % — ABNORMAL HIGH (ref 4.6–6.5)

## 2018-08-30 ENCOUNTER — Other Ambulatory Visit: Payer: Self-pay

## 2018-09-04 DIAGNOSIS — Z9849 Cataract extraction status, unspecified eye: Secondary | ICD-10-CM | POA: Diagnosis not present

## 2018-09-04 DIAGNOSIS — E113293 Type 2 diabetes mellitus with mild nonproliferative diabetic retinopathy without macular edema, bilateral: Secondary | ICD-10-CM | POA: Diagnosis not present

## 2018-09-04 DIAGNOSIS — H04123 Dry eye syndrome of bilateral lacrimal glands: Secondary | ICD-10-CM | POA: Diagnosis not present

## 2018-09-04 DIAGNOSIS — Z961 Presence of intraocular lens: Secondary | ICD-10-CM | POA: Diagnosis not present

## 2018-09-04 LAB — HM DIABETES EYE EXAM

## 2018-09-10 DIAGNOSIS — L851 Acquired keratosis [keratoderma] palmaris et plantaris: Secondary | ICD-10-CM | POA: Diagnosis not present

## 2018-09-10 DIAGNOSIS — B351 Tinea unguium: Secondary | ICD-10-CM | POA: Diagnosis not present

## 2018-09-10 DIAGNOSIS — E1342 Other specified diabetes mellitus with diabetic polyneuropathy: Secondary | ICD-10-CM | POA: Diagnosis not present

## 2018-10-14 ENCOUNTER — Other Ambulatory Visit: Payer: Self-pay | Admitting: Family Medicine

## 2018-10-15 ENCOUNTER — Other Ambulatory Visit: Payer: Self-pay | Admitting: Family Medicine

## 2018-11-19 DIAGNOSIS — L851 Acquired keratosis [keratoderma] palmaris et plantaris: Secondary | ICD-10-CM | POA: Diagnosis not present

## 2018-11-19 DIAGNOSIS — B351 Tinea unguium: Secondary | ICD-10-CM | POA: Diagnosis not present

## 2018-11-19 DIAGNOSIS — E1342 Other specified diabetes mellitus with diabetic polyneuropathy: Secondary | ICD-10-CM | POA: Diagnosis not present

## 2018-12-16 ENCOUNTER — Other Ambulatory Visit: Payer: Self-pay | Admitting: Family Medicine

## 2018-12-20 ENCOUNTER — Encounter: Payer: Self-pay | Admitting: Family Medicine

## 2018-12-20 ENCOUNTER — Ambulatory Visit (INDEPENDENT_AMBULATORY_CARE_PROVIDER_SITE_OTHER): Payer: Medicare Other | Admitting: Family Medicine

## 2018-12-20 VITALS — BP 146/72 | HR 72 | Temp 97.6°F | Ht 63.0 in | Wt 123.2 lb

## 2018-12-20 DIAGNOSIS — E119 Type 2 diabetes mellitus without complications: Secondary | ICD-10-CM

## 2018-12-20 DIAGNOSIS — E785 Hyperlipidemia, unspecified: Secondary | ICD-10-CM

## 2018-12-20 DIAGNOSIS — I10 Essential (primary) hypertension: Secondary | ICD-10-CM

## 2018-12-20 DIAGNOSIS — N183 Chronic kidney disease, stage 3 unspecified: Secondary | ICD-10-CM

## 2018-12-20 LAB — CBC
HCT: 41.1 % (ref 36.0–46.0)
HEMOGLOBIN: 13.8 g/dL (ref 12.0–15.0)
MCHC: 33.5 g/dL (ref 30.0–36.0)
MCV: 87.2 fl (ref 78.0–100.0)
PLATELETS: 203 10*3/uL (ref 150.0–400.0)
RBC: 4.71 Mil/uL (ref 3.87–5.11)
RDW: 13 % (ref 11.5–15.5)
WBC: 7.7 10*3/uL (ref 4.0–10.5)

## 2018-12-20 LAB — COMPREHENSIVE METABOLIC PANEL
ALBUMIN: 4.6 g/dL (ref 3.5–5.2)
ALK PHOS: 65 U/L (ref 39–117)
ALT: 16 U/L (ref 0–35)
AST: 20 U/L (ref 0–37)
BUN: 26 mg/dL — ABNORMAL HIGH (ref 6–23)
CALCIUM: 9.9 mg/dL (ref 8.4–10.5)
CHLORIDE: 102 meq/L (ref 96–112)
CO2: 26 mEq/L (ref 19–32)
Creatinine, Ser: 0.9 mg/dL (ref 0.40–1.20)
GFR: 58.85 mL/min — AB (ref >60.00)
Glucose, Bld: 112 mg/dL — ABNORMAL HIGH (ref 70–99)
POTASSIUM: 4.2 meq/L (ref 3.5–5.1)
Sodium: 139 mEq/L (ref 135–145)
TOTAL PROTEIN: 7.5 g/dL (ref 6.0–8.3)
Total Bilirubin: 0.7 mg/dL (ref 0.2–1.2)

## 2018-12-20 LAB — POCT GLYCOSYLATED HEMOGLOBIN (HGB A1C): Hemoglobin A1C: 6.3 % — AB (ref 4.0–5.6)

## 2018-12-20 NOTE — Progress Notes (Signed)
Phone [phone]   Subjective:  Linda Golden is a 85 y.o. year old very pleasant female patient who presents for/with See problem oriented charting ROS-denies hypoglycemia.  No chest pain or shortness of breath.  No headaches.  States has dry eyes but following with Ortho  Past Medical History-  Patient Active Problem List   Diagnosis Date Noted  . Diabetes mellitus type II, controlled, with no complications ([place]) 37/85/8850    Priority: High  . Diverticulitis of intestine with abscess 07/03/2015    Priority: Medium  . CKD (chronic kidney disease), stage III ([place]) 10/23/2014    Priority: Medium  . Hyperlipidemia 04/18/2007    Priority: Medium  . Essential hypertension 04/18/2007    Priority: Medium  . Raynaud's disease 10/08/2013    Priority: Low  . Osteoarthritis 07/01/2008    Priority: Low  . Varicose veins 11/04/2007    Priority: Low  . Osteopenia 04/18/2007    Priority: Low    Medications- reviewed and updated Current Outpatient Medications  Medication Sig Dispense Refill  . amLODipine (NORVASC) 2.5 MG tablet TAKE 1 TO 2 TABLETS BY MOUTH DAILY. 60 tablet 0  . hydrochlorothiazide (HYDRODIURIL) 25 MG tablet TAKE 1 TABLET BY MOUTH DAILY WITH LOSARTAN. 30 tablet 0  . losartan (COZAAR) 100 MG tablet TAKE 1 TABLET BY MOUTH DAILY WITH HYDROCHLORITHIZIDE. 30 tablet 0  . losartan-hydrochlorothiazide (HYZAAR) 100-25 MG tablet Take 1 tablet by mouth daily. 90 tablet 3   No current facility-administered medications for this visit.      Objective:  BP (!) 146/72   Pulse 72   Temp 97.6 F (36.4 C) (Oral)   Ht 5\' 3"  (1.6 m)   Wt 123 lb 3.2 oz (55.9 kg)   SpO2 97%   BMI 21.82 kg/m  Gen: NAD, resting comfortably CV: RRR no murmurs rubs or gallops Lungs: CTAB no crackles, wheeze, rhonchi Abdomen: soft/nontender/nondistended Ext: no edema Skin: warm, dry Neuro: Gait and speech normal    Diabetic Foot Exam - Simple   Simple Foot Form Diabetic Foot exam was performed  with the following findings:  Yes 12/20/2018 10:47 AM  Visual Inspection No deformities, no ulcerations, no other skin breakdown bilaterally:  Yes Sensation Testing Intact to touch and monofilament testing bilaterally:  Yes Pulse Check Posterior Tibialis and Dorsalis pulse intact bilaterally:  Yes Comments Some callus formation on both feet-sees podiatry for this        Assessment and Plan  #Diabetes/hyperlipidemia S: Diet controlled. Blood sugar 150 this am -Lipids mildly poorly controlled but given age over 25 we have not opted to start her on statin Lab Results  Component Value Date   HGBA1C 6.3 (A) 12/20/2018  A/P: Patient with excellent control of diabetes again today-continue dietary modifications -Mild poor control of statin-continue without medication given age in fact this is for primary prevention only   #Hypertension/CKD stage III S: Compliant with amlodipine 2.5 mg, hydrochlorothiazide 100-25 mg (right now pill is being split).  GFR is have been largely stable in the 50s.  Knows to avoid NSAIDs.  On arb in  case proteinuric element.  A/P: Suspect stable CKD-update CMP - Blood pressure slightly elevated today.  She reports home numbers consistently in 130s over 70s.  Since blood pressure has been controlled in last few visits and remains controlled at home opted to continue to monitor only  Other notes: 1.  Checked in with patient today-loss of her daughter-in-law in 2019 was very difficult on her.  She had been  waiting on liver transplant and had heart issues.  No future appointments. No follow-ups on file.  Lab/Order associations: Controlled type 2 diabetes mellitus without complication, without long-term current use of insulin ([place]) - Plan: POCT glycosylated hemoglobin (Hb A1C), CBC, Comprehensive metabolic panel  Return precautions advised.  [name], MD

## 2018-12-20 NOTE — Patient Instructions (Addendum)
Health Maintenance Due  Topic Date Due  . OPHTHALMOLOGY EXAM Please stop by the front desk to sign a release of information 08/24/2018   Please stop by lab before you go If you do not have mychart- we will call you about results within 5 business days of [place] receiving them.  If you have mychart- we will send your results within 3 business days of [place] receiving them.  If abnormal or we want to clarify a result, we will call or mychart you to make sure you receive the message.  If you have questions or concerns or don't hear within 5-7 days, please send [place] a message or call us.

## 2018-12-30 ENCOUNTER — Encounter: Payer: Self-pay | Admitting: Family Medicine

## 2019-01-17 ENCOUNTER — Other Ambulatory Visit: Payer: Self-pay

## 2019-01-17 MED ORDER — HYDROCHLOROTHIAZIDE 25 MG PO TABS: ORAL_TABLET | ORAL | 0 refills | Status: DC

## 2019-01-17 MED ORDER — LOSARTAN POTASSIUM 100 MG PO TABS: ORAL_TABLET | ORAL | 0 refills | Status: DC

## 2019-01-17 MED ORDER — AMLODIPINE BESYLATE 2.5 MG PO TABS: 2.5000 mg | ORAL_TABLET | Freq: Every day | ORAL | 0 refills | Status: DC

## 2019-02-18 ENCOUNTER — Other Ambulatory Visit: Payer: Self-pay | Admitting: Family Medicine

## 2019-02-20 DIAGNOSIS — C44319 Basal cell carcinoma of skin of other parts of face: Secondary | ICD-10-CM | POA: Diagnosis not present

## 2019-02-20 DIAGNOSIS — Z08 Encounter for follow-up examination after completed treatment for malignant neoplasm: Secondary | ICD-10-CM | POA: Diagnosis not present

## 2019-02-20 DIAGNOSIS — Z85828 Personal history of other malignant neoplasm of skin: Secondary | ICD-10-CM | POA: Diagnosis not present

## 2019-02-20 DIAGNOSIS — D225 Melanocytic nevi of trunk: Secondary | ICD-10-CM | POA: Diagnosis not present

## 2019-03-24 ENCOUNTER — Ambulatory Visit (INDEPENDENT_AMBULATORY_CARE_PROVIDER_SITE_OTHER): Payer: Medicare Other | Admitting: Otolaryngology

## 2019-03-24 DIAGNOSIS — H903 Sensorineural hearing loss, bilateral: Secondary | ICD-10-CM

## 2019-03-24 DIAGNOSIS — H838X3 Other specified diseases of inner ear, bilateral: Secondary | ICD-10-CM

## 2019-03-24 DIAGNOSIS — H6123 Impacted cerumen, bilateral: Secondary | ICD-10-CM | POA: Diagnosis not present

## 2019-03-24 DIAGNOSIS — C44319 Basal cell carcinoma of skin of other parts of face: Secondary | ICD-10-CM | POA: Diagnosis not present

## 2019-03-25 ENCOUNTER — Other Ambulatory Visit: Payer: Self-pay | Admitting: Family Medicine

## 2019-04-01 DIAGNOSIS — B351 Tinea unguium: Secondary | ICD-10-CM | POA: Diagnosis not present

## 2019-04-01 DIAGNOSIS — L851 Acquired keratosis [keratoderma] palmaris et plantaris: Secondary | ICD-10-CM | POA: Diagnosis not present

## 2019-04-01 DIAGNOSIS — E1342 Other specified diabetes mellitus with diabetic polyneuropathy: Secondary | ICD-10-CM | POA: Diagnosis not present

## 2019-04-23 NOTE — Progress Notes (Signed)
Phone [phone]   Subjective:  Virtual visit via phonenote Chief Complaint  Patient presents with  . Diabetes   This visit type was conducted due to national recommendations for restrictions regarding the COVID-19 Pandemic (e.g. social distancing).  This format is felt to be most appropriate for this patient at this time balancing risks to patient and risks to population by having him in for in person visit.  All issues noted in this document were discussed and addressed.  No physical exam was performed (except for noted visual exam or audio findings with Telehealth visits).  The patient has consented to conduct a Telehealth visit and understands insurance will be billed.   Our team/I connected with Briant Cedar at 10:40 AM EDT by phone (patient did not have equipment for webex) and verified that I am speaking with the correct person using two identifiers.  Location patient: [facility] Location provider: [facility], office Persons participating in the virtual visit:  patient  Time on phone: 12 minutes Counseling provided about  covid 19, care of chronic conditions  Our team/I discussed the limitations of evaluation and management by telemedicine and the availability of in person appointments. In light of current covid-19 pandemic, patient also understands that we are trying to protect them by minimizing in office contact if at all possible.  The patient expressed consent for telemedicine visit and agreed to proceed. Patient understands insurance will be billed.   ROS- No fever, chills, cough, congestion, runny nose, shortness of breath, new fatigue, body aches, sore throat, headache, nausea, vomiting, diarrhea, or new loss of taste or smell. No known contacts with covid 19 or someone being tested for covid 19.   Past Medical History-  Patient Active Problem List   Diagnosis Date Noted  . Controlled type 2 diabetes with renal manifestation ([place]) 04/18/2007    Priority: High  .  Diverticulitis of intestine with abscess 07/03/2015    Priority: Medium  . CKD (chronic kidney disease), stage III ([place]) 10/23/2014    Priority: Medium  . Hyperlipidemia associated with type 2 diabetes mellitus ([place]) 04/18/2007    Priority: Medium  . Hypertension associated with diabetes ([place]) 04/18/2007    Priority: Medium  . Raynaud's disease 10/08/2013    Priority: Low  . Osteoarthritis 07/01/2008    Priority: Low  . Varicose veins 11/04/2007    Priority: Low  . Osteopenia 04/18/2007    Priority: Low    Medications- reviewed and updated Current Outpatient Medications  Medication Sig Dispense Refill  . amLODipine (NORVASC) 2.5 MG tablet TAKE 1 TO 2 TABLETS BY MOUTH DAILY. 60 tablet 0  . hydrochlorothiazide (HYDRODIURIL) 25 MG tablet TAKE 1 TABLET BY MOUTH DAILY WITH LOSARTAN. 30 tablet 0  . losartan (COZAAR) 100 MG tablet TAKE 1 TABLET BY MOUTH DAILY WITH HYDROCHLORITHIZIDE. 30 tablet 0   No current facility-administered medications for this visit.      Objective:  BP (!) 133/57   Pulse 64   Temp (!) 97.3 F (36.3 C)   Ht 5\' 3"  (1.6 m)   Wt 124 lb (56.2 kg)   BMI 21.97 kg/m  self reported vitals  Nonlabored voice, normal speech     Assessment and Plan   # social update- staying safe- has church inside car. Staying careful with social distancing. Said happy belated birthday for 85th!   # Diabetes S: diet controlled  CBGs- fasting 156 cbg Exercise and diet- trying to stay active but no regular exercise Lab Results  Component Value Date  HGBA1C 6.3 (A) 12/20/2018   HGBA1C 6.8 (H) 08/19/2018   HGBA1C 6.7 (H) 03/01/2018  A/P: hopefully controlled- she will come by for a1c  #hypertension/CKD III S: controlled on amlodipine 2.5 mg, hctz 25 mg, losartan 100mg  (does not have combo pill yet).   GFR has been largely stable in the 50s. Knows to avoid nsaids. ARB in case proteinuric element as diabetic.  BP Readings from Last 3 Encounters:  04/24/19 (!) 133/57   12/20/18 (!) 146/72  08/19/18 138/66  A/P: stable blood pressure- continue current medicines CKD III hopefully stable- update bmp  #hyperlipidemia S: poorly controlled on no rx- Lab Results  Component Value Date   CHOL 236 (H) 03/01/2018   HDL 42.60 03/01/2018   LDLCALC 161 (H) 03/01/2018   LDLDIRECT 185.0 02/05/2017   TRIG 165.0 (H) 03/01/2018   CHOLHDL 6 03/01/2018   A/P: remains off cholesterol medicine given no history of heart attack/stroke so not indicated for primary prevention at age 85  4 month follow up  Lab/Order associations:   ICD-10-CM   1. Controlled type 2 diabetes mellitus with stage 3 chronic kidney disease, without long-term current use of insulin (HCC)  E11.22 CBC   N18.3 Comprehensive metabolic panel    Lipid panel    Hemoglobin A1c  2. Hypertension associated with diabetes ([place])  E11.59    I10   3. Hyperlipidemia associated with type 2 diabetes mellitus ([place])  E11.69    E78.5   4. CKD (chronic kidney disease), stage III ([place])  N18.3    Return precautions advised.  [name], MD

## 2019-04-24 ENCOUNTER — Ambulatory Visit (INDEPENDENT_AMBULATORY_CARE_PROVIDER_SITE_OTHER): Payer: Medicare Other | Admitting: Family Medicine

## 2019-04-24 ENCOUNTER — Encounter: Payer: Self-pay | Admitting: Family Medicine

## 2019-04-24 VITALS — BP 133/57 | HR 64 | Temp 97.3°F | Ht 63.0 in | Wt 124.0 lb

## 2019-04-24 DIAGNOSIS — E1159 Type 2 diabetes mellitus with other circulatory complications: Secondary | ICD-10-CM

## 2019-04-24 DIAGNOSIS — I10 Essential (primary) hypertension: Secondary | ICD-10-CM | POA: Diagnosis not present

## 2019-04-24 DIAGNOSIS — N183 Chronic kidney disease, stage 3 unspecified: Secondary | ICD-10-CM

## 2019-04-24 DIAGNOSIS — E1169 Type 2 diabetes mellitus with other specified complication: Secondary | ICD-10-CM | POA: Diagnosis not present

## 2019-04-24 DIAGNOSIS — E1122 Type 2 diabetes mellitus with diabetic chronic kidney disease: Secondary | ICD-10-CM | POA: Diagnosis not present

## 2019-04-24 DIAGNOSIS — E785 Hyperlipidemia, unspecified: Secondary | ICD-10-CM | POA: Diagnosis not present

## 2019-04-24 NOTE — Patient Instructions (Signed)
There are no preventive care reminders to display for this patient.  Depression screen [facility] 2/9 12/20/2018 05/30/2017 06/28/2016  Decreased Interest 0 0 0  Down, Depressed, Hopeless 0 0 0  PHQ - 2 Score 0 0 0

## 2019-04-25 ENCOUNTER — Other Ambulatory Visit: Payer: Self-pay | Admitting: Family Medicine

## 2019-04-28 ENCOUNTER — Other Ambulatory Visit (INDEPENDENT_AMBULATORY_CARE_PROVIDER_SITE_OTHER): Payer: Medicare Other

## 2019-04-28 ENCOUNTER — Other Ambulatory Visit: Payer: Self-pay

## 2019-04-28 DIAGNOSIS — N183 Chronic kidney disease, stage 3 unspecified: Secondary | ICD-10-CM

## 2019-04-28 DIAGNOSIS — E1122 Type 2 diabetes mellitus with diabetic chronic kidney disease: Secondary | ICD-10-CM | POA: Diagnosis not present

## 2019-04-28 LAB — CBC
HCT: 40.1 % (ref 36.0–46.0)
Hemoglobin: 13.4 g/dL (ref 12.0–15.0)
MCHC: 33.4 g/dL (ref 30.0–36.0)
MCV: 88.6 fl (ref 78.0–100.0)
Platelets: 219 10*3/uL (ref 150.0–400.0)
RBC: 4.53 Mil/uL (ref 3.87–5.11)
RDW: 12.9 % (ref 11.5–15.5)
WBC: 7.9 10*3/uL (ref 4.0–10.5)

## 2019-04-28 LAB — COMPREHENSIVE METABOLIC PANEL
ALT: 12 U/L (ref 0–35)
AST: 14 U/L (ref 0–37)
Albumin: 4.5 g/dL (ref 3.5–5.2)
Alkaline Phosphatase: 66 U/L (ref 39–117)
BUN: 26 mg/dL — ABNORMAL HIGH (ref 6–23)
CO2: 26 mEq/L (ref 19–32)
Calcium: 9.1 mg/dL (ref 8.4–10.5)
Chloride: 104 mEq/L (ref 96–112)
Creatinine, Ser: 1.01 mg/dL (ref 0.40–1.20)
GFR: 51.48 mL/min — ABNORMAL LOW (ref >60.00)
Glucose, Bld: 130 mg/dL — ABNORMAL HIGH (ref 70–99)
Potassium: 4.3 mEq/L (ref 3.5–5.1)
Sodium: 140 mEq/L (ref 135–145)
Total Bilirubin: 0.6 mg/dL (ref 0.2–1.2)
Total Protein: 7.2 g/dL (ref 6.0–8.3)

## 2019-04-28 LAB — LIPID PANEL
Cholesterol: 249 mg/dL — ABNORMAL HIGH (ref 0–200)
HDL: 36.5 mg/dL — ABNORMAL LOW (ref >39.00)
NonHDL: 212.54
Total CHOL/HDL Ratio: 7
Triglycerides: 250 mg/dL — ABNORMAL HIGH (ref 0.0–149.0)
VLDL: 50 mg/dL — ABNORMAL HIGH (ref 0.0–40.0)

## 2019-04-28 LAB — LDL CHOLESTEROL, DIRECT: Direct LDL: 183 mg/dL

## 2019-04-28 LAB — HEMOGLOBIN A1C: Hgb A1c MFr Bld: 6.9 % — ABNORMAL HIGH (ref 4.6–6.5)

## 2019-05-05 DIAGNOSIS — L905 Scar conditions and fibrosis of skin: Secondary | ICD-10-CM | POA: Diagnosis not present

## 2019-05-05 DIAGNOSIS — D485 Neoplasm of uncertain behavior of skin: Secondary | ICD-10-CM | POA: Diagnosis not present

## 2019-06-09 DIAGNOSIS — Z85828 Personal history of other malignant neoplasm of skin: Secondary | ICD-10-CM | POA: Diagnosis not present

## 2019-06-09 DIAGNOSIS — Z08 Encounter for follow-up examination after completed treatment for malignant neoplasm: Secondary | ICD-10-CM | POA: Diagnosis not present

## 2019-06-10 DIAGNOSIS — L851 Acquired keratosis [keratoderma] palmaris et plantaris: Secondary | ICD-10-CM | POA: Diagnosis not present

## 2019-06-10 DIAGNOSIS — E1342 Other specified diabetes mellitus with diabetic polyneuropathy: Secondary | ICD-10-CM | POA: Diagnosis not present

## 2019-06-10 DIAGNOSIS — B351 Tinea unguium: Secondary | ICD-10-CM | POA: Diagnosis not present

## 2019-07-02 ENCOUNTER — Telehealth: Payer: Self-pay | Admitting: Family Medicine

## 2019-07-02 NOTE — Telephone Encounter (Signed)
I left a message asking the patient to call me at [phone] to schedule AWV with Courtney. VDM (Dee-Dee) °

## 2019-07-15 ENCOUNTER — Ambulatory Visit: Payer: Self-pay | Admitting: *Deleted

## 2019-07-15 NOTE — Telephone Encounter (Signed)
Noted  

## 2019-07-15 NOTE — Telephone Encounter (Signed)
  I returned pt's call.   She was trimming bushes 2 weeks ago.   She has been having pain in her right shoulder and upper arm since then.   No falls or known injuries.   She can use her arm but it really hurts to get dressed, etc.  See triage notes.  I warm transferred her call to [facility] in Dr. Ansel Bong office to be scheduled.  I sent my notes to the office.  Reason for Disposition . [1] High-risk adult (e.g., age > 35, osteoporosis, chronic steroid use) AND [2] still hurts  Answer Assessment - Initial Assessment Questions 1. MECHANISM: "How did the injury happen?"     The muscles from my shoulder to my elbow hurt.   It hurts when I lay on that side and it hurts to put on my clothes.    This happened 2 wks.  I've taken Tylenol for the pain 2. ONSET: "When did the injury happen?" (Minutes or hours ago)      2 weeks ago    I assume it came from trimming bushes 2 weeks ago.   3. LOCATION: "Where is the injury located?"      My right arm and shoulder.    My thumb was sore but it's better. 4. APPEARANCE of INJURY: "What does the injury look like?"      No bruises or cuts 5. SEVERITY: "Can you use the arm normally?"      I can use my arm but it hurts to do anything. 6. SWELLING or BRUISING: "is there any swelling or bruising?" If so, ask: "How large is it? (e.g., inches, centimeters)      No   No fever 7. PAIN: "Is there pain?" If so, ask: "How bad is the pain?"    (Scale 1-10; or mild, moderate, severe)     Yes     Glucose 133 8. TETANUS: For any breaks in the skin, ask: "When was the last tetanus booster?"     Not askd 9. OTHER SYMPTOMS: "Do you have any other symptoms?"  (e.g., numbness in hand)     Just the hurting 10. PREGNANCY: "Is there any chance you are pregnant?" "When was your last menstrual period?"       N/A  Protocols used: ARM INJURY-A-AH

## 2019-07-15 NOTE — Telephone Encounter (Signed)
See note

## 2019-07-17 ENCOUNTER — Other Ambulatory Visit: Payer: Self-pay

## 2019-07-17 ENCOUNTER — Ambulatory Visit (INDEPENDENT_AMBULATORY_CARE_PROVIDER_SITE_OTHER): Payer: Medicare Other | Admitting: Family Medicine

## 2019-07-17 ENCOUNTER — Encounter: Payer: Self-pay | Admitting: Family Medicine

## 2019-07-17 VITALS — BP 136/78 | HR 70 | Temp 98.3°F | Ht 63.0 in | Wt 122.2 lb

## 2019-07-17 DIAGNOSIS — M25511 Pain in right shoulder: Secondary | ICD-10-CM | POA: Diagnosis not present

## 2019-07-17 DIAGNOSIS — E1122 Type 2 diabetes mellitus with diabetic chronic kidney disease: Secondary | ICD-10-CM

## 2019-07-17 DIAGNOSIS — N183 Chronic kidney disease, stage 3 unspecified: Secondary | ICD-10-CM

## 2019-07-17 DIAGNOSIS — I129 Hypertensive chronic kidney disease with stage 1 through stage 4 chronic kidney disease, or unspecified chronic kidney disease: Secondary | ICD-10-CM

## 2019-07-17 DIAGNOSIS — E1159 Type 2 diabetes mellitus with other circulatory complications: Secondary | ICD-10-CM | POA: Diagnosis not present

## 2019-07-17 NOTE — Patient Instructions (Addendum)
Health Maintenance Due  Topic Date Due  . INFLUENZA VACCINE -declines 05/17/2019   This looks like bursitis of shoulder. We will trial an injection today to try to help. I am hoping you are at least 50% better in 2 weeks and continue to improve after that- if you do not let me know  You had an injection today.  Things to be aware of after injection are listed below: . You may experience no significant improvement or even a slight worsening in your symptoms during the first 24 to 48 hours.  After that we expect your symptoms to improve gradually over the next 2 weeks for the medicine to have its maximal effect.  You should continue to have improvement out to 6 weeks after your injection. . Dr. Yong Channel recommends icing the site of the injection for 20 minutes at least 2 times the day of your injection . You may shower but no swimming, tub bath or Jacuzzi for 24 hours. . If your bandage falls off this does not need to be replaced.  It is appropriate to remove the bandage after 4 hours. . You may resume light activities as tolerated unless otherwise directed per Dr. Paulla Fore during your visit  POSSIBLE STEROID SIDE EFFECTS:  Side effects from injectable steroids tend to be less than when taken orally however you may experience some of the symptoms listed below.  If experienced these should only last for a short period of time. Change in menstrual flow  Edema (swelling)  Increased appetite Skin flushing (redness)  Skin rash/acne  Thrush (oral) Yeast vaginitis    Increased sweating  Depression Increased blood glucose levels Cramping and leg/calf  Euphoria (feeling happy)  POSSIBLE PROCEDURE SIDE EFFECTS: The side effects of the injection are usually fairly minimal however if you may experience some of the following side effects that are usually self-limited and will is off on their own.  If you are concerned please feel free to call the office with questions:  Increased numbness or tingling  Nausea or  vomiting  Swelling or bruising at the injection site   Please call our office if if you experience any of the following symptoms over the next week as these can be signs of infection:   Fever greater than 100.94F  Significant swelling at the injection site  Significant redness or drainage from the injection site  If after 2 weeks you are continuing to have worsening symptoms please call our office to discuss what the next appropriate actions should be including the potential for a return office visit or other diagnostic testing.

## 2019-07-17 NOTE — Progress Notes (Signed)
Phone [phone]   Subjective:  Linda Golden is a 85 y.o. year old very pleasant female patient who presents for/with See problem oriented charting Chief Complaint  Patient presents with  . Muscle Pain   ROS- no fever/chills/nausea/vomiting   Past Medical History-  Patient Active Problem List   Diagnosis Date Noted  . Controlled type 2 diabetes mellitus with renal manifestation ([place]) 04/18/2007    Priority: High  . Diverticulitis of intestine with abscess 07/03/2015    Priority: Medium  . CKD (chronic kidney disease), stage III 10/23/2014    Priority: Medium  . Hyperlipidemia associated with type 2 diabetes mellitus ([place]) 04/18/2007    Priority: Medium  . Hypertension associated with diabetes ([place]) 04/18/2007    Priority: Medium  . Raynaud's disease 10/08/2013    Priority: Low  . Osteoarthritis 07/01/2008    Priority: Low  . Varicose veins 11/04/2007    Priority: Low  . Osteopenia 04/18/2007    Priority: Low    Medications- reviewed and updated Current Outpatient Medications  Medication Sig Dispense Refill  . amLODipine (NORVASC) 2.5 MG tablet TAKE 1 TO 2 TABLETS BY MOUTH DAILY. 180 tablet 1  . hydrochlorothiazide (HYDRODIURIL) 25 MG tablet TAKE 1 TABLET BY MOUTH DAILY WITH LOSARTAN. 90 tablet 1  . losartan (COZAAR) 100 MG tablet TAKE 1 TABLET BY MOUTH DAILY WITH HYDROCHLOROTHIAZIDE. 90 tablet 1   No current facility-administered medications for this visit.      Objective:  BP 136/78 Comment: Last reported home readings-[place] hypertension in office  Pulse 70   Temp 98.3 F (36.8 C)   Ht 5\' 3"  (1.6 m)   Wt 122 lb 3.2 oz (55.4 kg)   SpO2 97%   BMI 21.65 kg/m  Gen: NAD, resting comfortably CV: RRR  Lungs: nonlabored, normal respiratory rate Abdomen: soft/nondistended Ext: no edema Skin: warm, dry  Right Shoulder: Inspection reveals no abnormalities, atrophy or asymmetry. Palpation is  Largely normal with no tenderness over AC joint- does have some  discomfort over bicipital groove. ROM is full in all planes but limited by pain- moves slowsly Rotator cuff strength normal throughout but limited by pain signs of impingement with positive Neer and Hawkin's tests, empty can. Painful arc noted, no drop arm sign  Normal left shoulder exam  Shoulder injection Verbal consent obtained and verified. Sterile betadine prep. Furthur cleansed with alcohol. Topical analgesic spray: Ethyl chloride. Joint: right subacromial injection Approached in typical fashion with: posterior approach Completed without difficulty Meds: 3 cc lidocaine 2% no epi, 1 cc depomedrol 80mg /cc Needle:1.5 inch 25 gauge Aftercare instructions and Red flags advised. Immediate improvement in pain noted     Assessment and Plan   # right shoulder pain S: Trimming shrubs about 3 weeks ago- started hurting in left hand and next day. Worked her shoulder out a lot. She started aching in that with moderate to severe pain in right shoulder that seemed to radiate into upper arm. Better with tylenol and rest. Worst with lifting hand above head whether through flexion or abduction- also hurts putting hand behind back. Denies neck pain. Also has some mild hand pain on left. No paresthesias  She gave it 2 weeks but simply is not getting better. She would like to have a steroid injection if possible as had a similar injury years ago and it really helped. Denies fall or injury A/P: suspect rotator cuff injury or bursitis. Patient has had significant relief with similar in past with shoulder injection. She would prefer  to go ahead and trial this   Instead of nsaids in particular with CKD III history. Discussed return precautions- she should see [place] back. Advised her of risks to sugar - short term increase with known diabetes.   #hypertension S: controlled on amlodipine 2.5 to 5 mg, hydrochlorothiazide 25 mg, losartan 100 mg.  Mildly elevated in office- all numbers reported below BP  Readings from Last 3 Encounters:  07/17/19 136/78  04/24/19 (!) 133/57  12/20/18 (!) 146/72  A/P:  Stable. Continue current medications as long as blood pressure at home remains stable.    Recommended follow up: As needed for acute issue  Lab/Order associations:   ICD-10-CM   1. Acute pain of right shoulder  M25.511   2. Hypertension associated with diabetes ([place])  E11.59    I10   3. Controlled type 2 diabetes mellitus with stage 3 chronic kidney disease, without long-term current use of insulin (HCC)  E11.22    N18.30    Return precautions advised.  [name], MD

## 2019-07-22 ENCOUNTER — Other Ambulatory Visit: Payer: Self-pay | Admitting: Family Medicine

## 2019-08-01 ENCOUNTER — Telehealth: Payer: Self-pay | Admitting: Family Medicine

## 2019-08-01 NOTE — Telephone Encounter (Signed)
See below

## 2019-08-01 NOTE — Telephone Encounter (Signed)
Patient is calling to report that her right arm is still painful with bursitis. And that she is still unable to lift any thing at this time in that arm. She said she is aware that Dr. Yong Channel did say it would take a while to get better. She was just calling to report as instructed by Dr. Yong Channel.  Thank you CB- [phone]

## 2019-08-04 NOTE — Telephone Encounter (Signed)
See note

## 2019-08-04 NOTE — Telephone Encounter (Signed)
FYI: pt calling to update

## 2019-08-04 NOTE — Telephone Encounter (Signed)
I would give it another 1 or 2 weeks-if not improving please tell her I would like to enter referral to sports medicine

## 2019-08-04 NOTE — Telephone Encounter (Signed)
Called and lm on pt vm tcb regarding below message.

## 2019-08-04 NOTE — Telephone Encounter (Signed)
Patient is returning [place] call Call back [phone]

## 2019-08-05 NOTE — Telephone Encounter (Signed)
Called and spoke with pt and gave her below info.

## 2019-08-05 NOTE — Telephone Encounter (Signed)
Returned pt call and the line rang busy.

## 2019-08-07 DIAGNOSIS — Z23 Encounter for immunization: Secondary | ICD-10-CM | POA: Diagnosis not present

## 2019-08-19 DIAGNOSIS — L851 Acquired keratosis [keratoderma] palmaris et plantaris: Secondary | ICD-10-CM | POA: Diagnosis not present

## 2019-08-19 DIAGNOSIS — E1342 Other specified diabetes mellitus with diabetic polyneuropathy: Secondary | ICD-10-CM | POA: Diagnosis not present

## 2019-08-19 DIAGNOSIS — B351 Tinea unguium: Secondary | ICD-10-CM | POA: Diagnosis not present

## 2019-08-22 DIAGNOSIS — E119 Type 2 diabetes mellitus without complications: Secondary | ICD-10-CM | POA: Diagnosis not present

## 2019-08-22 LAB — HM DIABETES EYE EXAM

## 2019-09-04 ENCOUNTER — Telehealth: Payer: Self-pay | Admitting: Family Medicine

## 2019-09-04 NOTE — Telephone Encounter (Signed)
See below

## 2019-09-04 NOTE — Telephone Encounter (Signed)
Patient calling to request test strips for glucose monitor. Patient did not know name of strips, only that Dr. Yong Channel had given her a new machine. Please advise.

## 2019-09-08 ENCOUNTER — Other Ambulatory Visit: Payer: Self-pay

## 2019-09-08 MED ORDER — GLUCOSE BLOOD VI STRP: ORAL_STRIP | 2 refills | Status: DC

## 2019-09-08 NOTE — Telephone Encounter (Signed)
Script sent in

## 2019-10-07 ENCOUNTER — Telehealth: Payer: Self-pay | Admitting: Family Medicine

## 2019-10-07 NOTE — Telephone Encounter (Signed)
Called the patient and she just wanted me to send a message back to see if anything would stop her from get the Vaccine since she is allergic to certain things. Please Advise.  Copied from [place] [phone]. Topic: General - Other >> Oct 07, 2019 10:21 AM Yvette Rack wrote: Reason for CRM: Pt stated she is concerned about her allergies and would like to know if she should get the vaccine. Pt requests call back. >> Oct 07, 2019 10:25 AM Carole Binning B wrote: Set up virtual appt to discuss, can be in office also

## 2019-10-07 NOTE — Telephone Encounter (Signed)
I do not see that she has ever had anaphylaxis-I do not see a reason why she cannot proceed forward with vaccination

## 2019-10-07 NOTE — Telephone Encounter (Signed)
Do you want to see patient to review everything with her?

## 2019-10-08 NOTE — Telephone Encounter (Signed)
Called patient gave information. Will add to list to call if we received any

## 2019-10-20 ENCOUNTER — Other Ambulatory Visit: Payer: Self-pay | Admitting: Family Medicine

## 2019-11-06 ENCOUNTER — Other Ambulatory Visit: Payer: Self-pay

## 2019-11-06 ENCOUNTER — Ambulatory Visit (INDEPENDENT_AMBULATORY_CARE_PROVIDER_SITE_OTHER): Payer: PPO

## 2019-11-06 DIAGNOSIS — Z0000 Encounter for general adult medical examination without abnormal findings: Secondary | ICD-10-CM | POA: Diagnosis not present

## 2019-11-06 NOTE — Progress Notes (Addendum)
This visit is being conducted via phone call due to the COVID-19 pandemic. This patient has given me verbal consent via phone to conduct this visit, patient states they are participating from their home address. Some vital signs may be absent or patient reported.   Patient identification: identified by name, DOB, and current address.  Location provider: [facility], Office Persons participating in the virtual visit: Linda George LPN, patient, and Dr. [name]   Subjective:   Linda Golden is a 86 y.o. female who presents for Medicare Annual (Subsequent) preventive examination.  Review of Systems:   Cardiac Risk Factors include: advanced age (>39men, >28 women);diabetes mellitus;hypertension    Objective:     Vitals: There were no vitals taken for this visit.  There is no height or weight on file to calculate BMI.  Advanced Directives 11/06/2019 05/30/2017 07/03/2015  Does Patient Have a Medical Advance Directive? Yes No No  Type of Advance Directive Living will;Healthcare Power of Attorney - -  Does patient want to make changes to medical advance directive? No - Patient declined - -  Copy of [place] in Chart? No - copy requested - -  Would patient like information on creating a medical advance directive? - - No - patient declined information    Tobacco Social History   Tobacco Use  Smoking Status Never Smoker  Smokeless Tobacco Never Used     Counseling given: Not Answered   Clinical Intake:  Pre-visit preparation completed: Yes  Pain : No/denies pain  Diabetes: Yes CBG done?: No Did pt. bring in CBG monitor from home?: No  How often do you need to have someone help you when you read instructions, pamphlets, or other written materials from your doctor or pharmacy?: 1 - Never  Interpreter Needed?: No  Information entered by :: Linda George LPN  Past Medical History:  Diagnosis Date  . 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 Diabetes mellitus   . Hyperlipidemia   . Hypertension   . Kidney mass    was told was born with this  . Osteopenia    Past Surgical History:  Procedure Laterality Date  . CATARACT EXTRACTION     bilateral planned-right done, left soon  . foot spur     Family History  Problem Relation Age of Onset  . Hypertension Father   . Alzheimer's disease Mother    Social History   Socioeconomic History  . Marital status: Widowed    Spouse name: Not on file  . Number of children: Not on file  . Years of education: Not on file  . Highest education level: Not on file  Occupational History  . Not on file  Tobacco Use  . Smoking status: Never Smoker  . Smokeless tobacco: Never Used  Substance and Sexual Activity  . Alcohol use: No  . Drug use: No  . Sexual activity: Not on file  Other Topics Concern  . Not on file  Social History Narrative   Widowed 2004. 1 son. 2 adopted grandchildren, 2 greatgrandkids. Assists in the care of autistic grandchild    [place]. Son lives close as well as grandson (next door). Completely independent. Still mows her yard.       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       Hobbies: reading, work outside      Scientist, physiological Strain:   .  Difficulty of Paying Living Expenses: Not on file  Food Insecurity:   . Worried About Charity fundraiser in the Last Year: Not on file  . Ran Out of Food in the Last Year: Not on file  Transportation Needs:   . Lack of Transportation (Medical): Not on file  . Lack of Transportation (Non-Medical): Not on file  Physical Activity:   . Days of Exercise per Week: Not on file  . Minutes of Exercise per Session: Not on file  Stress:   . Feeling of Stress : Not on file  Social Connections:   . Frequency of Communication with Friends and Family: Not on file  . Frequency of Social Gatherings with Friends and Family: Not on file  . Attends Religious Services: Not on  file  . Active Member of Clubs or Organizations: Not on file  . Attends Archivist Meetings: Not on file  . Marital Status: Not on file    Outpatient Encounter Medications as of 11/06/2019  Medication Sig  . amLODipine (NORVASC) 2.5 MG tablet TAKE 1 TO 2 TABLETS BY MOUTH DAILY.  . hydrochlorothiazide (HYDRODIURIL) 25 MG tablet TAKE 1 TABLET BY MOUTH DAILY WITH LOSARTAN.  Marland Kitchen losartan (COZAAR) 100 MG tablet TAKE 1 TABLET BY MOUTH DAILY WITH HYDROCHLOROTHIAZIDE.  Marland Kitchen glucose blood test strip Up to 4 times a day Dx E11.29 (Patient not taking: Reported on 11/06/2019)   No facility-administered encounter medications on file as of 11/06/2019.    Activities of Daily Living In your present state of health, do you have any difficulty performing the following activities: 11/06/2019  Hearing? N  Vision? N  Difficulty concentrating or making decisions? N  Walking or climbing stairs? N  Dressing or bathing? N  Doing errands, shopping? N  Preparing Food and eating ? N  Using the Toilet? N  In the past six months, have you accidently leaked urine? N  Do you have problems with loss of bowel control? N  Managing your Medications? N  Managing your Finances? N  Housekeeping or managing your Housekeeping? N  Some recent data might be hidden    Patient Care Team: Marin Olp, MD as PCP - General (Family Medicine)    Assessment:   This is a routine wellness examination for [name].  Exercise Activities and Dietary recommendations Current Exercise Habits: The patient does not participate in regular exercise at present(does complete household tasks)  Goals    . Increase water intake     Will drink water with your meals     . patient     Stay active and healthy       Fall Risk Fall Risk  11/06/2019 12/20/2018 08/30/2018 05/30/2017 06/28/2016  Falls in the past year? 0 0 0 No No  Comment - - Emmi Telephone Survey: data to providers prior to load - -  Number falls in past yr: 0 0 - -  -  Injury with Fall? 0 0 - - -  Follow up Falls evaluation completed;Education provided;Falls prevention discussed - - - -   Is the patient's home free of loose throw rugs in walkways, pet beds, electrical cords, etc?   yes      Grab bars in the bathroom? yes      Handrails on the stairs?   yes      Adequate lighting?   yes  Depression Screen PHQ 2/9 Scores 11/06/2019 07/17/2019 12/20/2018 05/30/2017  PHQ - 2 Score 0 0 0 0  Cognitive Function- no voiced cognitive concerns at this time      6CIT Screen 11/06/2019 05/30/2017  What Year? 0 points 0 points  What month? 0 points 0 points  What time? 0 points 0 points  Count back from 20 0 points 0 points  Months in reverse 0 points 0 points  Repeat phrase 0 points 0 points  Total Score 0 0    Immunization History  Administered Date(s) Administered  . Influenza Split 07/27/2011, 07/08/2012  . Influenza Whole 07/16/2006, 08/22/2007, 07/01/2008, 07/30/2009, 06/29/2010  . Influenza, High Dose Seasonal PF 08/18/2016, 08/03/2017, 08/19/2018  . Influenza,inj,Quad PF,6+ Mos 08/01/2013, 08/05/2014, 07/13/2015  . Pneumococcal Conjugate-13 05/24/2015  . Pneumococcal Polysaccharide-23 02/14/1999  . Td 10/16/2005  . Tdap 03/01/2018    Qualifies for Shingles Vaccine?Discussed and patient will check with pharmacy for coverage.  Patient education handout provided   Screening Tests Health Maintenance  Topic Date Due  . OPHTHALMOLOGY EXAM  09/05/2019  . HEMOGLOBIN A1C  10/29/2019  . INFLUENZA VACCINE  01/14/2020 (Originally 05/17/2019)  . FOOT EXAM  12/20/2019  . TETANUS/TDAP  03/01/2028  . PNA vac Low Risk Adult  Completed  . DEXA SCAN  Addressed    Cancer Screenings: Lung: Low Dose CT Chest recommended if Age 53-80 years, 30 pack-year currently smoking OR have quit w/in 15years. Patient does not qualify. Breast:  Up to date on Mammogram? Yes   Up to date of Bone Density/Dexa? Yes Colorectal: No longer indicated     Plan:  I have  personally reviewed and addressed the Medicare Annual Wellness questionnaire and have noted the following in the patient's chart:  A. Medical and social history B. Use of alcohol, tobacco or illicit drugs  C. Current medications and supplements D. Functional ability and status E.  Nutritional status F.  Physical activity G. Advance directives H. List of other physicians I.  Hospitalizations, surgeries, and ER visits in previous 12 months J.  [place] such as hearing and vision if needed, cognitive and depression L. Referrals, records requested, and appointments- will request documentation of flu shot administration   In addition, I have reviewed and discussed with patient certain preventive protocols, quality metrics, and best practice recommendations. A written personalized care plan for preventive services as well as general preventive health recommendations were provided to patient.   Signed,  Linda George, LPN  Nurse Health Advisor   Nurse Notes: Patient is requesting a refill to testing strips sent to [facility] on [address] In [place] if she needs to continue to monitor blood sugar. She currently has an EZ Max V Meter.      I have reviewed and agree with note, evaluation, plan.  Forwarded request to [facility] team for refills.  [name], MD

## 2019-11-06 NOTE — Patient Instructions (Signed)
Linda Golden , Thank you for taking time to come for your Medicare Wellness Visit. I appreciate your ongoing commitment to your health goals. Please review the following plan we discussed and let me know if I can assist you in the future.   Screening recommendations/referrals: Colorectal Screening: No longer indicated  Mammogram: No longer indicated  Bone Density: No longer indicated   Vision and Dental Exams: Recommended annual ophthalmology exams for early detection of glaucoma and other disorders of the eye Recommended annual dental exams for proper oral hygiene  Vaccinations: Influenza vaccine: completed at pharmacy (we will request information from pharmacy) Pneumococcal vaccine: up to date; last 05/24/15 Tdap vaccine: up to date; last 03/01/18  Shingles vaccine: Please call your insurance company to determine your out of pocket expense for the Shingrix vaccine. You may receive this vaccine at your local pharmacy.  Advanced directives: Please bring a copy of your POA (Power of Attorney) and/or Living Will to your next appointment.  Goals: Recommend to drink at least 6-8 8oz glasses of water per day and consume a balanced diet rich in fresh fruits and vegetables.   Next appointment: Please schedule your Annual Wellness Visit with your Nurse Health Advisor in one year.  Preventive Care 55 Years and Older, Female Preventive care refers to lifestyle choices and visits with your health care provider that can promote health and wellness. What does preventive care include?  A yearly physical exam. This is also called an annual well check.  Dental exams once or twice a year.  Routine eye exams. Ask your health care provider how often you should have your eyes checked.  Personal lifestyle choices, including:  Daily care of your teeth and gums.  Regular physical activity.  Eating a healthy diet.  Avoiding tobacco and drug use.  Limiting alcohol use.  Practicing safe sex.  Taking  low-dose aspirin every day if recommended by your health care provider.  Taking vitamin and mineral supplements as recommended by your health care provider. What happens during an annual well check? The services and screenings done by your health care provider during your annual well check will depend on your age, overall health, lifestyle risk factors, and family history of disease. Counseling  Your health care provider may ask you questions about your:  Alcohol use.  Tobacco use.  Drug use.  Emotional well-being.  Home and relationship well-being.  Sexual activity.  Eating habits.  History of falls.  Memory and ability to understand (cognition).  Work and work Statistician.  Reproductive health. Screening  You may have the following tests or measurements:  Height, weight, and BMI.  Blood pressure.  Lipid and cholesterol levels. These may be checked every 5 years, or more frequently if you are over 16 years old.  Skin check.  Lung cancer screening. You may have this screening every year starting at age 72 if you have a 30-pack-year history of smoking and currently smoke or have quit within the past 15 years.  Fecal occult blood test (FOBT) of the stool. You may have this test every year starting at age 24.  Flexible sigmoidoscopy or colonoscopy. You may have a sigmoidoscopy every 5 years or a colonoscopy every 10 years starting at age 31.  Hepatitis C blood test.  Hepatitis B blood test.  Sexually transmitted disease (STD) testing.  Diabetes screening. This is done by checking your blood sugar (glucose) after you have not eaten for a while (fasting). You may have this done every 1-3 years.  Bone density scan. This is done to screen for osteoporosis. You may have this done starting at age 67.  Mammogram. This may be done every 1-2 years. Talk to your health care provider about how often you should have regular mammograms. Talk with your health care provider  about your test results, treatment options, and if necessary, the need for more tests. Vaccines  Your health care provider may recommend certain vaccines, such as:  Influenza vaccine. This is recommended every year.  Tetanus, diphtheria, and acellular pertussis (Tdap, Td) vaccine. You may need a Td booster every 10 years.  Zoster vaccine. You may need this after age 82.  Pneumococcal 13-valent conjugate (PCV13) vaccine. One dose is recommended after age 34.  Pneumococcal polysaccharide (PPSV23) vaccine. One dose is recommended after age 74. Talk to your health care provider about which screenings and vaccines you need and how often you need them. This information is not intended to replace advice given to you by your health care provider. Make sure you discuss any questions you have with your health care provider. Document Released: 10/29/2015 Document Revised: 06/21/2016 Document Reviewed: 08/03/2015 Elsevier Interactive Patient Education  2017 [place] Prevention in the Home Falls can cause injuries. They can happen to people of all ages. There are many things you can do to make your home safe and to help prevent falls. What can I do on the outside of my home?  Regularly fix the edges of walkways and driveways and fix any cracks.  Remove anything that might make you trip as you walk through a door, such as a raised step or threshold.  Trim any bushes or trees on the path to your home.  Use bright outdoor lighting.  Clear any walking paths of anything that might make someone trip, such as rocks or tools.  Regularly check to see if handrails are loose or broken. Make sure that both sides of any steps have handrails.  Any raised decks and porches should have guardrails on the edges.  Have any leaves, snow, or ice cleared regularly.  Use sand or salt on walking paths during winter.  Clean up any spills in your garage right away. This includes oil or grease  spills. What can I do in the bathroom?  Use night lights.  Install grab bars by the toilet and in the tub and shower. Do not use towel bars as grab bars.  Use non-skid mats or decals in the tub or shower.  If you need to sit down in the shower, use a plastic, non-slip stool.  Keep the floor dry. Clean up any water that spills on the floor as soon as it happens.  Remove soap buildup in the tub or shower regularly.  Attach bath mats securely with double-sided non-slip rug tape.  Do not have throw rugs and other things on the floor that can make you trip. What can I do in the bedroom?  Use night lights.  Make sure that you have a light by your bed that is easy to reach.  Do not use any sheets or blankets that are too big for your bed. They should not hang down onto the floor.  Have a firm chair that has side arms. You can use this for support while you get dressed.  Do not have throw rugs and other things on the floor that can make you trip. What can I do in the kitchen?  Clean up any spills right away.  Avoid walking  on wet floors.  Keep items that you use a lot in easy-to-reach places.  If you need to reach something above you, use a strong step stool that has a grab bar.  Keep electrical cords out of the way.  Do not use floor polish or wax that makes floors slippery. If you must use wax, use non-skid floor wax.  Do not have throw rugs and other things on the floor that can make you trip. What can I do with my stairs?  Do not leave any items on the stairs.  Make sure that there are handrails on both sides of the stairs and use them. Fix handrails that are broken or loose. Make sure that handrails are as long as the stairways.  Check any carpeting to make sure that it is firmly attached to the stairs. Fix any carpet that is loose or worn.  Avoid having throw rugs at the top or bottom of the stairs. If you do have throw rugs, attach them to the floor with carpet  tape.  Make sure that you have a light switch at the top of the stairs and the bottom of the stairs. If you do not have them, ask someone to add them for you. What else can I do to help prevent falls?  Wear shoes that:  Do not have high heels.  Have rubber bottoms.  Are comfortable and fit you well.  Are closed at the toe. Do not wear sandals.  If you use a stepladder:  Make sure that it is fully opened. Do not climb a closed stepladder.  Make sure that both sides of the stepladder are locked into place.  Ask someone to hold it for you, if possible.  Clearly mark and make sure that you can see:  Any grab bars or handrails.  First and last steps.  Where the edge of each step is.  Use tools that help you move around (mobility aids) if they are needed. These include:  Canes.  Walkers.  Scooters.  Crutches.  Turn on the lights when you go into a dark area. Replace any light bulbs as soon as they burn out.  Set up your furniture so you have a clear path. Avoid moving your furniture around.  If any of your floors are uneven, fix them.  If there are any pets around you, be aware of where they are.  Review your medicines with your doctor. Some medicines can make you feel dizzy. This can increase your chance of falling. Ask your doctor what other things that you can do to help prevent falls. This information is not intended to replace advice given to you by your health care provider. Make sure you discuss any questions you have with your health care provider. Document Released: 07/29/2009 Document Revised: 03/09/2016 Document Reviewed: 11/06/2014 Elsevier Interactive Patient Education  2017 [employer].

## 2019-12-16 DIAGNOSIS — B351 Tinea unguium: Secondary | ICD-10-CM | POA: Diagnosis not present

## 2019-12-16 DIAGNOSIS — L851 Acquired keratosis [keratoderma] palmaris et plantaris: Secondary | ICD-10-CM | POA: Diagnosis not present

## 2019-12-16 DIAGNOSIS — E1342 Other specified diabetes mellitus with diabetic polyneuropathy: Secondary | ICD-10-CM | POA: Diagnosis not present

## 2020-01-26 ENCOUNTER — Other Ambulatory Visit: Payer: Self-pay | Admitting: Family Medicine

## 2020-02-24 DIAGNOSIS — L851 Acquired keratosis [keratoderma] palmaris et plantaris: Secondary | ICD-10-CM | POA: Diagnosis not present

## 2020-02-24 DIAGNOSIS — E1342 Other specified diabetes mellitus with diabetic polyneuropathy: Secondary | ICD-10-CM | POA: Diagnosis not present

## 2020-02-24 DIAGNOSIS — B351 Tinea unguium: Secondary | ICD-10-CM | POA: Diagnosis not present

## 2020-04-02 ENCOUNTER — Other Ambulatory Visit: Payer: Self-pay

## 2020-04-02 ENCOUNTER — Encounter: Payer: Self-pay | Admitting: Family Medicine

## 2020-04-02 ENCOUNTER — Ambulatory Visit (INDEPENDENT_AMBULATORY_CARE_PROVIDER_SITE_OTHER): Payer: PPO | Admitting: Family Medicine

## 2020-04-02 VITALS — BP 128/68 | HR 71 | Temp 97.7°F | Ht 63.0 in | Wt 123.0 lb

## 2020-04-02 DIAGNOSIS — I152 Hypertension secondary to endocrine disorders: Secondary | ICD-10-CM

## 2020-04-02 DIAGNOSIS — E1169 Type 2 diabetes mellitus with other specified complication: Secondary | ICD-10-CM | POA: Diagnosis not present

## 2020-04-02 DIAGNOSIS — N183 Chronic kidney disease, stage 3 unspecified: Secondary | ICD-10-CM

## 2020-04-02 DIAGNOSIS — I10 Essential (primary) hypertension: Secondary | ICD-10-CM | POA: Diagnosis not present

## 2020-04-02 DIAGNOSIS — E785 Hyperlipidemia, unspecified: Secondary | ICD-10-CM | POA: Diagnosis not present

## 2020-04-02 DIAGNOSIS — E1159 Type 2 diabetes mellitus with other circulatory complications: Secondary | ICD-10-CM | POA: Diagnosis not present

## 2020-04-02 DIAGNOSIS — E1122 Type 2 diabetes mellitus with diabetic chronic kidney disease: Secondary | ICD-10-CM

## 2020-04-02 LAB — COMPREHENSIVE METABOLIC PANEL
ALT: 11 U/L (ref 0–35)
AST: 14 U/L (ref 0–37)
Albumin: 4.6 g/dL (ref 3.5–5.2)
Alkaline Phosphatase: 71 U/L (ref 39–117)
BUN: 31 mg/dL — ABNORMAL HIGH (ref 6–23)
CO2: 27 mEq/L (ref 19–32)
Calcium: 9.6 mg/dL (ref 8.4–10.5)
Chloride: 105 mEq/L (ref 96–112)
Creatinine, Ser: 0.9 mg/dL (ref 0.40–1.20)
GFR: 58.68 mL/min — ABNORMAL LOW (ref >60.00)
Glucose, Bld: 104 mg/dL — ABNORMAL HIGH (ref 70–99)
Potassium: 4.5 mEq/L (ref 3.5–5.1)
Sodium: 139 mEq/L (ref 135–145)
Total Bilirubin: 0.5 mg/dL (ref 0.2–1.2)
Total Protein: 7.3 g/dL (ref 6.0–8.3)

## 2020-04-02 LAB — CBC WITH DIFFERENTIAL/PLATELET
Basophils Absolute: 0 10*3/uL (ref 0.0–0.1)
Basophils Relative: 0.6 % (ref 0.0–3.0)
Eosinophils Absolute: 0.2 10*3/uL (ref 0.0–0.7)
Eosinophils Relative: 2.6 % (ref 0.0–5.0)
HCT: 39 % (ref 36.0–46.0)
Hemoglobin: 13.1 g/dL (ref 12.0–15.0)
Lymphocytes Relative: 35.8 % (ref 12.0–46.0)
Lymphs Abs: 2.9 10*3/uL (ref 0.7–4.0)
MCHC: 33.6 g/dL (ref 30.0–36.0)
MCV: 87.9 fl (ref 78.0–100.0)
Monocytes Absolute: 0.6 10*3/uL (ref 0.1–1.0)
Monocytes Relative: 7.9 % (ref 3.0–12.0)
Neutro Abs: 4.3 10*3/uL (ref 1.4–7.7)
Neutrophils Relative %: 53.1 % (ref 43.0–77.0)
Platelets: 221 10*3/uL (ref 150.0–400.0)
RBC: 4.43 Mil/uL (ref 3.87–5.11)
RDW: 13.3 % (ref 11.5–15.5)
WBC: 8.1 10*3/uL (ref 4.0–10.5)

## 2020-04-02 LAB — LIPID PANEL
Cholesterol: 255 mg/dL — ABNORMAL HIGH (ref 0–200)
HDL: 40.8 mg/dL (ref >39.00)
NonHDL: 213.76
Total CHOL/HDL Ratio: 6
Triglycerides: 257 mg/dL — ABNORMAL HIGH (ref 0.0–149.0)
VLDL: 51.4 mg/dL — ABNORMAL HIGH (ref 0.0–40.0)

## 2020-04-02 LAB — LDL CHOLESTEROL, DIRECT: Direct LDL: 180 mg/dL

## 2020-04-02 LAB — POCT GLYCOSYLATED HEMOGLOBIN (HGB A1C): Hemoglobin A1C: 6.1 % — AB (ref 4.0–5.6)

## 2020-04-02 LAB — TSH: TSH: 2.98 u[IU]/mL (ref 0.35–4.50)

## 2020-04-02 NOTE — Progress Notes (Addendum)
Phone [phone] In person visit   Subjective:   Linda Golden is a 86 y.o. year old very pleasant female patient who presents for/with See problem oriented charting Chief Complaint  Patient presents with  . Diabetes  . Hypertension    This visit occurred during the SARS-CoV-2 public health emergency.  Safety protocols were in place, including screening questions prior to the visit, additional usage of staff PPE, and extensive cleaning of exam room while observing appropriate contact time as indicated for disinfecting solutions.   Past Medical History-  Patient Active Problem List   Diagnosis Date Noted  . Controlled type 2 diabetes mellitus with renal manifestation ([place]) 04/18/2007    Priority: High  . Diverticulitis of intestine with abscess 07/03/2015    Priority: Medium  . CKD (chronic kidney disease), stage III 10/23/2014    Priority: Medium  . Hyperlipidemia associated with type 2 diabetes mellitus ([place]) 04/18/2007    Priority: Medium  . Hypertension associated with diabetes ([place]) 04/18/2007    Priority: Medium  . Raynaud's disease 10/08/2013    Priority: Low  . Osteoarthritis 07/01/2008    Priority: Low  . Varicose veins 11/04/2007    Priority: Low  . Osteopenia 04/18/2007    Priority: Low    Medications- reviewed and updated Current Outpatient Medications  Medication Sig Dispense Refill  . amLODipine (NORVASC) 2.5 MG tablet TAKE 1 TO 2 TABLETS BY MOUTH DAILY. 180 tablet 0  . glucose blood test strip Up to 4 times a day Dx E11.29 100 each 2  . hydrochlorothiazide (HYDRODIURIL) 25 MG tablet TAKE 1 TABLET BY MOUTH DAILY WITH LOSARTAN. 90 tablet 0  . losartan (COZAAR) 100 MG tablet TAKE 1 TABLET BY MOUTH DAILY WITH HYDROCHLOROTHIAZIDE. 90 tablet 0  . tobramycin (TOBREX) 0.3 % ophthalmic solution INSTILL 1 DROP INTO [facility] FOUR TIMES DAILY x FIVE DAYS.     No current facility-administered medications for this visit.     Objective:  BP 128/68   Pulse 71    Temp 97.7 F (36.5 C) (Temporal)   Ht 5\' 3"  (1.6 m)   Wt 123 lb (55.8 kg)   SpO2 98%   BMI 21.79 kg/m  Gen: NAD, resting comfortably CV: RRR no murmurs rubs or gallops Lungs: CTAB no crackles, wheeze, rhonchi Ext: trace edema Skin: warm, dry  Diabetic Foot Exam - Simple   Simple Foot Form Diabetic Foot exam was performed with the following findings: Yes 04/02/2020 11:29 AM  Visual Inspection No deformities, no ulcerations, no other skin breakdown bilaterally: Yes Sensation Testing Intact to touch and monofilament testing bilaterally: Yes Pulse Check Posterior Tibialis and Dorsalis pulse intact bilaterally: Yes Comments        Assessment and Plan   #Diabetes S: Diet controlled.   CBGs- does not check  Lab Results  Component Value Date   HGBA1C poc 6.1 (A) 04/02/2020   HGBA1C 6.9 (H) 04/28/2019   HGBA1C 6.3 (A) 12/20/2018  A/P: Remains diet controlled-continue without medication  #hyperlipidemia S: Medication:none    A/P: poor control of lipids but given patients age without CAD/CVA history opted to remain off statin.  -  Did look back after visit and noted some coronary artery calcifications on 2016 CT- I will plan to discuss this next visit with her but with history of hair loss on statins I doubt she will be strongly interested - also check tsh to make sure not cause of high lipids   #Hypertension/CKD stage III S: Compliant with amlodipine 2.5  mg,losartan hydrochlorothiazide 100-25 mg (right now pill is being split).  GFR is have been largely stable in the 50s.  Knows to avoid NSAIDs.  On ACE-i case proteinuric element.  A/P: BP looks great. Hoping CKD stable- update cmp today   Recommended follow up:  6 month physical   Lab/Order associations:   ICD-10-CM   1. Controlled type 2 diabetes mellitus with stage 3 chronic kidney disease, without long-term current use of insulin (HCC)  E11.22 POCT HgB A1C   N18.30 CBC with Differential/Platelet    Comprehensive  metabolic panel    Lipid panel  2. Hypertension associated with diabetes ([place])  E11.59    I10   3. Hyperlipidemia associated with type 2 diabetes mellitus (HCC)  E11.69 TSH   E78.5    Return precautions advised.  [name], MD

## 2020-04-02 NOTE — Patient Instructions (Addendum)
Health Maintenance Due  Topic Date Due  . OPHTHALMOLOGY EXAM- Sign release of information at the check out desk for last eye exam 09/05/2019   Please stop by lab before you go If you have mychart- we will send your results within 3 business days of [place] receiving them.  If you do not have mychart- we will call you about results within 5 business days of [place] receiving them.   No changes today- blood pressure looks great   Recommended follow up: Return in about 6 months (around 10/02/2020) for physical or sooner if needed.

## 2020-04-13 ENCOUNTER — Encounter: Payer: Self-pay | Admitting: Family Medicine

## 2020-04-16 DIAGNOSIS — H903 Sensorineural hearing loss, bilateral: Secondary | ICD-10-CM | POA: Diagnosis not present

## 2020-04-28 ENCOUNTER — Other Ambulatory Visit: Payer: Self-pay | Admitting: Family Medicine

## 2020-05-14 DIAGNOSIS — R69 Illness, unspecified: Secondary | ICD-10-CM | POA: Diagnosis not present

## 2020-05-18 DIAGNOSIS — B351 Tinea unguium: Secondary | ICD-10-CM | POA: Diagnosis not present

## 2020-05-18 DIAGNOSIS — L851 Acquired keratosis [keratoderma] palmaris et plantaris: Secondary | ICD-10-CM | POA: Diagnosis not present

## 2020-05-18 DIAGNOSIS — E1342 Other specified diabetes mellitus with diabetic polyneuropathy: Secondary | ICD-10-CM | POA: Diagnosis not present

## 2020-06-03 DIAGNOSIS — D225 Melanocytic nevi of trunk: Secondary | ICD-10-CM | POA: Diagnosis not present

## 2020-06-03 DIAGNOSIS — C44519 Basal cell carcinoma of skin of other part of trunk: Secondary | ICD-10-CM | POA: Diagnosis not present

## 2020-06-03 DIAGNOSIS — Z85828 Personal history of other malignant neoplasm of skin: Secondary | ICD-10-CM | POA: Diagnosis not present

## 2020-06-03 DIAGNOSIS — Z08 Encounter for follow-up examination after completed treatment for malignant neoplasm: Secondary | ICD-10-CM | POA: Diagnosis not present

## 2020-07-19 DIAGNOSIS — C44519 Basal cell carcinoma of skin of other part of trunk: Secondary | ICD-10-CM | POA: Diagnosis not present

## 2020-07-27 DIAGNOSIS — E1342 Other specified diabetes mellitus with diabetic polyneuropathy: Secondary | ICD-10-CM | POA: Diagnosis not present

## 2020-07-27 DIAGNOSIS — B351 Tinea unguium: Secondary | ICD-10-CM | POA: Diagnosis not present

## 2020-07-27 DIAGNOSIS — L851 Acquired keratosis [keratoderma] palmaris et plantaris: Secondary | ICD-10-CM | POA: Diagnosis not present

## 2020-08-03 ENCOUNTER — Other Ambulatory Visit: Payer: Self-pay | Admitting: Family Medicine

## 2020-08-04 ENCOUNTER — Telehealth: Payer: Self-pay

## 2020-08-04 MED ORDER — HYDROCHLOROTHIAZIDE 25 MG PO TABS: ORAL_TABLET | ORAL | 2 refills | Status: DC

## 2020-08-04 NOTE — Telephone Encounter (Signed)
Refill sent.

## 2020-08-04 NOTE — Telephone Encounter (Signed)
.   LAST APPOINTMENT DATE: 08/03/2020   NEXT APPOINTMENT DATE:@12 /21/2021  MEDICATION:hydrochlorothiazide (HYDRODIURIL) 25 MG tablet  PHARMACY:[facility] - [place], [place] - [address]  **Let patient know to contact pharmacy at the end of the day to make sure medication is ready. **  ** Please notify patient to allow 48-72 hours to process**  **Encourage patient to contact the pharmacy for refills or they can request refills through [facility]**  CLINICAL FILLS OUT ALL BELOW:   LAST REFILL:  QTY:  REFILL DATE:    OTHER COMMENTS:    Okay for refill?  Please advise

## 2020-09-20 DIAGNOSIS — L570 Actinic keratosis: Secondary | ICD-10-CM | POA: Diagnosis not present

## 2020-09-20 DIAGNOSIS — X32XXXD Exposure to sunlight, subsequent encounter: Secondary | ICD-10-CM | POA: Diagnosis not present

## 2020-09-20 DIAGNOSIS — C44519 Basal cell carcinoma of skin of other part of trunk: Secondary | ICD-10-CM | POA: Diagnosis not present

## 2020-10-04 NOTE — Patient Instructions (Addendum)
Health Maintenance Due  Topic Date Due  . OPHTHALMOLOGY EXAM Sign release to get a copy of your exam. 08/21/2020   Please stop by lab before you go If you have mychart- we will send your results within 3 business days of [place] receiving them.  If you do not have mychart- we will call you about results within 5 business days of [place] receiving them.  *please note we are currently using [facility] which has a longer processing time than [facility] typically so labs may not come back as quickly as in the past *please also note that you will see labs on mychart as soon as they post. I will later go in and write notes on them- will say "notes from Dr. Yong Channel"  Glad you are doing so well!

## 2020-10-04 NOTE — Progress Notes (Signed)
Phone [phone]   Subjective:  Patient presents today for their annual physical. Chief complaint-noted.   See problem oriented charting- ROS- full  review of systems was completed and negative except for: Stable fatigue, in process of getting a crown placed-has had some drooling with this, left ear pain no recent worsening-normal exam, hearing loss in right ear-wears hearing aid, postnasal drip from allergies, eye discharge-she saw Dr. And uses drops 3 times a day-has upcoming ophthalmology visit, dry eyes for years causing some intermittent eye discomfort, trace left leg swelling stable from baseline, cold intolerance, tends to be thirsty, since she drinks a lot has very minor urination though no increased worsening lately, joint pain, back pain, muscle aches, skin cancer removals by Dr. [place] Crane  The following were reviewed and entered/updated in epic: Past Medical History:  Diagnosis Date  . 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 Diabetes mellitus   . Hyperlipidemia   . Hypertension   . Kidney mass    was told was born with this  . Osteopenia    Patient Active Problem List   Diagnosis Date Noted  . Controlled type 2 diabetes mellitus with renal manifestation ([place]) 04/18/2007    Priority: High  . Diverticulitis of intestine with abscess 07/03/2015    Priority: Medium  . CKD (chronic kidney disease), stage III ([place]) 10/23/2014    Priority: Medium  . Hyperlipidemia associated with type 2 diabetes mellitus ([place]) 04/18/2007    Priority: Medium  . Hypertension associated with diabetes ([place]) 04/18/2007    Priority: Medium  . Raynaud's disease 10/08/2013    Priority: Low  . Osteoarthritis 07/01/2008    Priority: Low  . Varicose veins 11/04/2007    Priority: Low  . Osteopenia 04/18/2007    Priority: Low   Past Surgical History:  Procedure Laterality Date  . CATARACT EXTRACTION     bilateral planned-right done, left soon  . foot spur      Family History  Problem  Relation Age of Onset  . Hypertension Father   . Alzheimer's disease Mother     Medications- reviewed and updated Current Outpatient Medications  Medication Sig Dispense Refill  . glucose blood test strip Up to 4 times a day Dx E11.29 100 each 2  . tobramycin (TOBREX) 0.3 % ophthalmic solution INSTILL 1 DROP INTO [facility] FOUR TIMES DAILY x FIVE DAYS.    Marland Kitchen amLODipine (NORVASC) 2.5 MG tablet Take 1 tablet (2.5 mg total) by mouth daily. 90 tablet 3  . hydrochlorothiazide (HYDRODIURIL) 25 MG tablet Take one tablet by mouth with Losartan. 90 tablet 3  . losartan (COZAAR) 100 MG tablet Take 1 tablet (100 mg total) by mouth daily. 90 tablet 3   No current facility-administered medications for this visit.    Allergies-reviewed and updated Allergies  Allergen Reactions  . Lisinopril Cough  . Statins Other (See Comments)    Hair loss  . Sulfa Antibiotics Other (See Comments)    No reaction-patient states her family member was "burned" from this medication    Social History   Social History Narrative   Widowed 2004. 1 son. 2 adopted grandchildren, 2 greatgrandkids. Assists in the care of autistic grandchild    [place]. Son lives close as well as grandson (next door). Completely independent. Still mows her yard.       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       Hobbies: reading, work outside  Objective  Objective:  BP 124/64   Pulse 77   Temp 97.6 F (36.4 C) (Temporal)   Resp 18   Ht 5\' 3"  (1.6 m)   Wt 121 lb 3.2 oz (55 kg)   SpO2 97%   BMI 21.47 kg/m  Gen: NAD, resting comfortably, appears younger than stated age HEENT: Mucous membranes are moist. Oropharynx normal Neck: no thyromegaly CV: RRR no murmurs rubs or gallops Lungs: CTAB no crackles, wheeze, rhonchi Abdomen: soft/nontender/nondistended/normal bowel sounds. No rebound or guarding.  Ext: no edema on right, trace on left-stable with baseline Skin: warm, dry Neuro: grossly normal other  than hard of hearing, moves all extremities, PERRLA   Assessment and Plan   86 y.o. female presenting for annual physical.  Health Maintenance counseling: 1. Anticipatory guidance: Patient counseled regarding regular dental exams q6 months, eye exams-yearly and we need a copy,  avoiding smoking and second hand smoke, limiting alcohol to 1 beverage per day.   2. Risk factor reduction:  Advised patient of need for regular exercise and diet rich and fruits and vegetables to reduce risk of heart attack and stroke. Exercise-very active around her home but no intentional regular exercise. Diet-tries to eat reasonably healthy but does admit to overdoing [employer]-we will need to check A1c today.  Wt Readings from Last 3 Encounters:  10/05/20 121 lb 3.2 oz (55 kg)  04/02/20 123 lb (55.8 kg)  07/17/19 122 lb 3.2 oz (55.4 kg)  3. Immunizations/screenings/ancillary studies- declines shingrix for now Immunization History  Administered Date(s) Administered  . Fluad Quad(high Dose 65+) 08/24/2020  . Influenza Split 07/27/2011, 07/08/2012  . Influenza Whole 07/16/2006, 08/22/2007, 07/01/2008, 07/30/2009, 06/29/2010  . Influenza, High Dose Seasonal PF 08/18/2016, 08/03/2017, 08/19/2018  . Influenza,inj,Quad PF,6+ Mos 08/01/2013, 08/05/2014, 07/13/2015  . Moderna Sars-Covid-2 Vaccination 12/11/2019, 01/09/2020, 09/30/2020  . Pneumococcal Conjugate-13 05/24/2015  . Pneumococcal Polysaccharide-23 02/14/1999  . Td 10/16/2005  . Tdap 03/01/2018  4. Cervical cancer screening-past age based screening recommendations, no blood or discharge noted 5. Breast cancer screening-  mammogram past age based screening recommendations.  Denies any palpable abnormalities 6. Colon cancer screening -past age based screening recommendations.  No blood in stool or melena 7. Skin cancer screening-continues to follow with Dr. [place] Crane.  Advised regular sunscreen use. Denies worrisome, changing, or new skin lesions.  8. Birth  control/STD check-postmenopausal/not active 9. Osteoporosis screening at 65-last bone density 2008-recommended repeat today but she declines -Never smoker  Status of chronic or acute concerns   #Diabetes S: Diet controlled.  CBGs trending up some Lab Results  Component Value Date   HGBA1C 6.1 (A) 04/02/2020   HGBA1C 6.9 (H) 04/28/2019   HGBA1C 6.3 (A) 12/20/2018  A/P: Hopefully A1c at least under 7.5-she can modify diet if needed-admits to eating too many sweets and drinking too many Pepsi's   #Hypertension/CKD stage III S: Compliant with amlodipine 2.5 mg,losartan hydrochlorothiazide 100-25 mg (right now pill is being split).  GFR is have been largely stable in the 50s.  Knows to avoid NSAIDs.  On ACE-i case proteinuric element.   BP Readings from Last 3 Encounters:  10/05/20 124/64  04/02/20 128/68  07/17/19 136/78   A/P: CKD 3 hopefully stable-update CMP today.  Blood pressure well controlled-continue current medication  #hyperlipidemia S: Medication: none  . had hair loss in the past on statin.  - some coronary artery calcifications on 2016 CT-does not want to try Zetia either Lab Results  Component Value Date   CHOL 255 (H) 04/02/2020  HDL 40.80 04/02/2020   LDLCALC 161 (H) 03/01/2018   LDLDIRECT 180.0 04/02/2020   TRIG 257.0 (H) 04/02/2020   CHOLHDL 6 04/02/2020    A/P: Lipids significantly elevated and with history of some coronary artery calcifications-with hair loss on statins, patient age she wants to hold off on statin-also does not want to try Zetia at this time as she understands my desire to reduce her cardiovascular risk  Recommended follow up: Return in about 6 months (around 04/05/2021) for follow up- or sooner if needed.  Lab/Order associations:non fasting   ICD-10-CM   1. Preventative health care  Z00.00 Hemoglobin A1c    CBC With Differential/Platelet    COMPLETE METABOLIC PANEL WITH GFR  2. Hypertension associated with diabetes ([place])  E11.59 CBC With  Differential/Platelet   R43.2 COMPLETE METABOLIC PANEL WITH GFR  3. Controlled type 2 diabetes mellitus with stage 3 chronic kidney disease, without long-term current use of insulin ([facility])  E11.22 Hemoglobin A1c   N18.30 CBC With Differential/Platelet    COMPLETE METABOLIC PANEL WITH GFR  4. Hyperlipidemia associated with type 2 diabetes mellitus ([place])  E11.69    E78.5     Meds ordered this encounter  Medications  . amLODipine (NORVASC) 2.5 MG tablet    Sig: Take 1 tablet (2.5 mg total) by mouth daily.    Dispense:  90 tablet    Refill:  3  . hydrochlorothiazide (HYDRODIURIL) 25 MG tablet    Sig: Take one tablet by mouth with Losartan.    Dispense:  90 tablet    Refill:  3  . losartan (COZAAR) 100 MG tablet    Sig: Take 1 tablet (100 mg total) by mouth daily.    Dispense:  90 tablet    Refill:  3    Return precautions advised.  [name], MD

## 2020-10-05 ENCOUNTER — Ambulatory Visit (INDEPENDENT_AMBULATORY_CARE_PROVIDER_SITE_OTHER): Payer: PPO | Admitting: Family Medicine

## 2020-10-05 ENCOUNTER — Encounter: Payer: Self-pay | Admitting: Family Medicine

## 2020-10-05 ENCOUNTER — Other Ambulatory Visit: Payer: Self-pay

## 2020-10-05 VITALS — BP 124/64 | HR 77 | Temp 97.6°F | Resp 18 | Ht 63.0 in | Wt 121.2 lb

## 2020-10-05 DIAGNOSIS — E1122 Type 2 diabetes mellitus with diabetic chronic kidney disease: Secondary | ICD-10-CM | POA: Diagnosis not present

## 2020-10-05 DIAGNOSIS — E1159 Type 2 diabetes mellitus with other circulatory complications: Secondary | ICD-10-CM

## 2020-10-05 DIAGNOSIS — N183 Chronic kidney disease, stage 3 unspecified: Secondary | ICD-10-CM

## 2020-10-05 DIAGNOSIS — I152 Hypertension secondary to endocrine disorders: Secondary | ICD-10-CM

## 2020-10-05 DIAGNOSIS — E1169 Type 2 diabetes mellitus with other specified complication: Secondary | ICD-10-CM

## 2020-10-05 DIAGNOSIS — Z0000 Encounter for general adult medical examination without abnormal findings: Secondary | ICD-10-CM | POA: Diagnosis not present

## 2020-10-05 DIAGNOSIS — E785 Hyperlipidemia, unspecified: Secondary | ICD-10-CM | POA: Diagnosis not present

## 2020-10-05 MED ORDER — LOSARTAN POTASSIUM 100 MG PO TABS: 100.0000 mg | ORAL_TABLET | Freq: Every day | ORAL | 3 refills | Status: DC

## 2020-10-05 MED ORDER — AMLODIPINE BESYLATE 2.5 MG PO TABS: 2.5000 mg | ORAL_TABLET | Freq: Every day | ORAL | 3 refills | Status: DC

## 2020-10-05 MED ORDER — HYDROCHLOROTHIAZIDE 25 MG PO TABS: ORAL_TABLET | ORAL | 3 refills | Status: DC

## 2020-10-06 LAB — COMPLETE METABOLIC PANEL WITH GFR
AG Ratio: 1.6 (calc) (ref 1.0–2.5)
ALT: 14 U/L (ref 6–29)
AST: 17 U/L (ref 10–35)
Albumin: 4.4 g/dL (ref 3.6–5.1)
Alkaline phosphatase (APISO): 67 U/L (ref 37–153)
BUN/Creatinine Ratio: 25 (calc) — ABNORMAL HIGH (ref 6–22)
BUN: 23 mg/dL (ref 7–25)
CO2: 27 mmol/L (ref 20–32)
Calcium: 9.9 mg/dL (ref 8.6–10.4)
Chloride: 101 mmol/L (ref 98–110)
Creat: 0.91 mg/dL — ABNORMAL HIGH (ref 0.60–0.88)
GFR, Est African American: 64 mL/min/{1.73_m2} (ref >=60)
GFR, Est Non African American: 55 mL/min/{1.73_m2} — ABNORMAL LOW (ref >=60)
Globulin: 2.7 g/dL (calc) (ref 1.9–3.7)
Glucose, Bld: 109 mg/dL — ABNORMAL HIGH (ref 65–99)
Potassium: 3.6 mmol/L (ref 3.5–5.3)
Sodium: 139 mmol/L (ref 135–146)
Total Bilirubin: 0.4 mg/dL (ref 0.2–1.2)
Total Protein: 7.1 g/dL (ref 6.1–8.1)

## 2020-10-06 LAB — CBC WITH DIFFERENTIAL/PLATELET
Absolute Monocytes: 705 cells/uL (ref 200–950)
Basophils Absolute: 47 cells/uL (ref 0–200)
Basophils Relative: 0.5 %
Eosinophils Absolute: 273 cells/uL (ref 15–500)
Eosinophils Relative: 2.9 %
HCT: 38.7 % (ref 35.0–45.0)
Hemoglobin: 12.8 g/dL (ref 11.7–15.5)
Lymphs Abs: 2811 cells/uL (ref 850–3900)
MCH: 28.9 pg (ref 27.0–33.0)
MCHC: 33.1 g/dL (ref 32.0–36.0)
MCV: 87.4 fL (ref 80.0–100.0)
MPV: 10.7 fL (ref 7.5–12.5)
Monocytes Relative: 7.5 %
Neutro Abs: 5565 cells/uL (ref 1500–7800)
Neutrophils Relative %: 59.2 %
Platelets: 259 10*3/uL (ref 140–400)
RBC: 4.43 10*6/uL (ref 3.80–5.10)
RDW: 12.2 % (ref 11.0–15.0)
Total Lymphocyte: 29.9 %
WBC: 9.4 10*3/uL (ref 3.8–10.8)

## 2020-10-06 LAB — HEMOGLOBIN A1C
Hgb A1c MFr Bld: 6.8 % of total Hgb — ABNORMAL HIGH (ref <5.7)
Mean Plasma Glucose: 148 mg/dL
eAG (mmol/L): 8.2 mmol/L

## 2020-11-04 DIAGNOSIS — C44519 Basal cell carcinoma of skin of other part of trunk: Secondary | ICD-10-CM | POA: Diagnosis not present

## 2020-11-25 ENCOUNTER — Telehealth: Payer: Self-pay

## 2020-11-25 NOTE — Telephone Encounter (Signed)
Patient is calling in stating that she has not heard back for her lab results from 10/05/20, asking if someone can give her a call.

## 2020-11-25 NOTE — Telephone Encounter (Signed)
Called and spoke with pt and labs reviewed. 

## 2020-11-26 DIAGNOSIS — L851 Acquired keratosis [keratoderma] palmaris et plantaris: Secondary | ICD-10-CM | POA: Diagnosis not present

## 2020-11-26 DIAGNOSIS — B351 Tinea unguium: Secondary | ICD-10-CM | POA: Diagnosis not present

## 2020-11-26 DIAGNOSIS — E1342 Other specified diabetes mellitus with diabetic polyneuropathy: Secondary | ICD-10-CM | POA: Diagnosis not present

## 2020-12-13 ENCOUNTER — Ambulatory Visit (INDEPENDENT_AMBULATORY_CARE_PROVIDER_SITE_OTHER): Payer: PPO

## 2020-12-13 DIAGNOSIS — Z0000 Encounter for general adult medical examination without abnormal findings: Secondary | ICD-10-CM | POA: Diagnosis not present

## 2020-12-13 NOTE — Patient Instructions (Addendum)
Ms. Corporan , Thank you for taking time to come for your Medicare Wellness Visit. I appreciate your ongoing commitment to your health goals. Please review the following plan we discussed and let me know if I can assist you in the future.   Screening recommendations/referrals: Colonoscopy: No longer required  Mammogram: No longer required  Bone Density: No longer required Recommended yearly ophthalmology/optometry visit for glaucoma screening and checkup Recommended yearly dental visit for hygiene and checkup  Vaccinations: Influenza vaccine: Up to date Pneumococcal vaccine: Up to date Tdap vaccine: Up to date Shingles vaccine: Shingrix discussed. Please contact your pharmacy for coverage information.    Covid-19:Completed 2/25, 3/26, & 09/30/20  Advanced directives: Advance directive discussed with you today. Even though you declined this today please call our office should you change your mind and we can give you the proper paperwork for you to fill out.  Conditions/risks identified: Stay healthy and wake up every morning   Next appointment: Follow up in one year for your annual wellness visit    Preventive Care 65 Years and Older, Female Preventive care refers to lifestyle choices and visits with your health care provider that can promote health and wellness. What does preventive care include?  A yearly physical exam. This is also called an annual well check.  Dental exams once or twice a year.  Routine eye exams. Ask your health care provider how often you should have your eyes checked.  Personal lifestyle choices, including:  Daily care of your teeth and gums.  Regular physical activity.  Eating a healthy diet.  Avoiding tobacco and drug use.  Limiting alcohol use.  Practicing safe sex.  Taking low-dose aspirin every day.  Taking vitamin and mineral supplements as recommended by your health care provider. What happens during an annual well check? The services and  screenings done by your health care provider during your annual well check will depend on your age, overall health, lifestyle risk factors, and family history of disease. Counseling  Your health care provider may ask you questions about your:  Alcohol use.  Tobacco use.  Drug use.  Emotional well-being.  Home and relationship well-being.  Sexual activity.  Eating habits.  History of falls.  Memory and ability to understand (cognition).  Work and work Statistician.  Reproductive health. Screening  You may have the following tests or measurements:  Height, weight, and BMI.  Blood pressure.  Lipid and cholesterol levels. These may be checked every 5 years, or more frequently if you are over 29 years old.  Skin check.  Lung cancer screening. You may have this screening every year starting at age 57 if you have a 30-pack-year history of smoking and currently smoke or have quit within the past 15 years.  Fecal occult blood test (FOBT) of the stool. You may have this test every year starting at age 63.  Flexible sigmoidoscopy or colonoscopy. You may have a sigmoidoscopy every 5 years or a colonoscopy every 10 years starting at age 31.  Hepatitis C blood test.  Hepatitis B blood test.  Sexually transmitted disease (STD) testing.  Diabetes screening. This is done by checking your blood sugar (glucose) after you have not eaten for a while (fasting). You may have this done every 1-3 years.  Bone density scan. This is done to screen for osteoporosis. You may have this done starting at age 23.  Mammogram. This may be done every 1-2 years. Talk to your health care provider about how often you should  have regular mammograms. Talk with your health care provider about your test results, treatment options, and if necessary, the need for more tests. Vaccines  Your health care provider may recommend certain vaccines, such as:  Influenza vaccine. This is recommended every  year.  Tetanus, diphtheria, and acellular pertussis (Tdap, Td) vaccine. You may need a Td booster every 10 years.  Zoster vaccine. You may need this after age 64.  Pneumococcal 13-valent conjugate (PCV13) vaccine. One dose is recommended after age 7.  Pneumococcal polysaccharide (PPSV23) vaccine. One dose is recommended after age 67. Talk to your health care provider about which screenings and vaccines you need and how often you need them. This information is not intended to replace advice given to you by your health care provider. Make sure you discuss any questions you have with your health care provider. Document Released: 10/29/2015 Document Revised: 06/21/2016 Document Reviewed: 08/03/2015 Elsevier Interactive Patient Education  2017 [place] Prevention in the Home Falls can cause injuries. They can happen to people of all ages. There are many things you can do to make your home safe and to help prevent falls. What can I do on the outside of my home?  Regularly fix the edges of walkways and driveways and fix any cracks.  Remove anything that might make you trip as you walk through a door, such as a raised step or threshold.  Trim any bushes or trees on the path to your home.  Use bright outdoor lighting.  Clear any walking paths of anything that might make someone trip, such as rocks or tools.  Regularly check to see if handrails are loose or broken. Make sure that both sides of any steps have handrails.  Any raised decks and porches should have guardrails on the edges.  Have any leaves, snow, or ice cleared regularly.  Use sand or salt on walking paths during winter.  Clean up any spills in your garage right away. This includes oil or grease spills. What can I do in the bathroom?  Use night lights.  Install grab bars by the toilet and in the tub and shower. Do not use towel bars as grab bars.  Use non-skid mats or decals in the tub or shower.  If you  need to sit down in the shower, use a plastic, non-slip stool.  Keep the floor dry. Clean up any water that spills on the floor as soon as it happens.  Remove soap buildup in the tub or shower regularly.  Attach bath mats securely with double-sided non-slip rug tape.  Do not have throw rugs and other things on the floor that can make you trip. What can I do in the bedroom?  Use night lights.  Make sure that you have a light by your bed that is easy to reach.  Do not use any sheets or blankets that are too big for your bed. They should not hang down onto the floor.  Have a firm chair that has side arms. You can use this for support while you get dressed.  Do not have throw rugs and other things on the floor that can make you trip. What can I do in the kitchen?  Clean up any spills right away.  Avoid walking on wet floors.  Keep items that you use a lot in easy-to-reach places.  If you need to reach something above you, use a strong step stool that has a grab bar.  Keep electrical cords out of  the way.  Do not use floor polish or wax that makes floors slippery. If you must use wax, use non-skid floor wax.  Do not have throw rugs and other things on the floor that can make you trip. What can I do with my stairs?  Do not leave any items on the stairs.  Make sure that there are handrails on both sides of the stairs and use them. Fix handrails that are broken or loose. Make sure that handrails are as long as the stairways.  Check any carpeting to make sure that it is firmly attached to the stairs. Fix any carpet that is loose or worn.  Avoid having throw rugs at the top or bottom of the stairs. If you do have throw rugs, attach them to the floor with carpet tape.  Make sure that you have a light switch at the top of the stairs and the bottom of the stairs. If you do not have them, ask someone to add them for you. What else can I do to help prevent falls?  Wear shoes  that:  Do not have high heels.  Have rubber bottoms.  Are comfortable and fit you well.  Are closed at the toe. Do not wear sandals.  If you use a stepladder:  Make sure that it is fully opened. Do not climb a closed stepladder.  Make sure that both sides of the stepladder are locked into place.  Ask someone to hold it for you, if possible.  Clearly mark and make sure that you can see:  Any grab bars or handrails.  First and last steps.  Where the edge of each step is.  Use tools that help you move around (mobility aids) if they are needed. These include:  Canes.  Walkers.  Scooters.  Crutches.  Turn on the lights when you go into a dark area. Replace any light bulbs as soon as they burn out.  Set up your furniture so you have a clear path. Avoid moving your furniture around.  If any of your floors are uneven, fix them.  If there are any pets around you, be aware of where they are.  Review your medicines with your doctor. Some medicines can make you feel dizzy. This can increase your chance of falling. Ask your doctor what other things that you can do to help prevent falls. This information is not intended to replace advice given to you by your health care provider. Make sure you discuss any questions you have with your health care provider. Document Released: 07/29/2009 Document Revised: 03/09/2016 Document Reviewed: 11/06/2014 Elsevier Interactive Patient Education  2017 [employer].

## 2020-12-13 NOTE — Progress Notes (Signed)
Virtual Visit via Telephone Note  I connected with  Linda Golden on 12/13/20 at 11:45 AM EST by telephone and verified that I am speaking with the correct person using two identifiers.  Medicare Annual Wellness visit completed telephonically due to Covid-19 pandemic.   Persons participating in this call: This Health Coach and this patient.   Location: Patient: Home Provider: Office   I discussed the limitations, risks, security and privacy concerns of performing an evaluation and management service by telephone and the availability of in person appointments. The patient expressed understanding and agreed to proceed.  Unable to perform video visit due to video visit attempted and failed and/or patient does not have video capability.   Some vital signs may be absent or patient reported.   Linda Brace, Linda Golden    Subjective:   Linda Golden is a 87 y.o. female who presents for Medicare Annual (Subsequent) preventive examination.  Review of Systems     Cardiac Risk Factors include: advanced age (>37men, >39 women);diabetes mellitus;dyslipidemia;hypertension     Objective:    There were no vitals filed for this visit. There is no height or weight on file to calculate BMI.  Advanced Directives 12/13/2020 11/06/2019 05/30/2017 07/03/2015  Does Patient Have a Medical Advance Directive? No Yes No No  Type of Advance Directive - Living will;Healthcare Power of Attorney - -  Does patient want to make changes to medical advance directive? - No - Patient declined - -  Copy of [place] in Chart? - No - copy requested - -  Would patient like information on creating a medical advance directive? No - Patient declined - - No - patient declined information    Current Medications (verified) Outpatient Encounter Medications as of 12/13/2020  Medication Sig  . amLODipine (NORVASC) 2.5 MG tablet Take 1 tablet (2.5 mg total) by mouth daily.  Marland Kitchen glucose blood test strip Up to 4  times a day Dx E11.29  . hydrochlorothiazide (HYDRODIURIL) 25 MG tablet Take one tablet by mouth with Losartan.  . losartan (COZAAR) 100 MG tablet Take 1 tablet (100 mg total) by mouth daily.  Marland Kitchen tobramycin (TOBREX) 0.3 % ophthalmic solution INSTILL 1 DROP INTO THEALEFT EYE FOUR TIMES DAILY x FIVE DAYS.   No facility-administered encounter medications on file as of 12/13/2020.    Allergies (verified) Lisinopril, Statins, and Sulfa antibiotics   History: Past Medical History:  Diagnosis Date  . 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 Diabetes mellitus   . Hyperlipidemia   . Hypertension   . Kidney mass    was told was born with this  . Osteopenia    Past Surgical History:  Procedure Laterality Date  . CATARACT EXTRACTION     bilateral planned-right done, left soon  . foot spur     Family History  Problem Relation Age of Onset  . Hypertension Father   . Alzheimer's disease Mother    Social History   Socioeconomic History  . Marital status: Widowed    Spouse name: Not on file  . Number of children: Not on file  . Years of education: Not on file  . Highest education level: Not on file  Occupational History  . Not on file  Tobacco Use  . Smoking status: Never Smoker  . Smokeless tobacco: Never Used  Substance and Sexual Activity  . Alcohol use: No  . Drug use: No  . Sexual activity: Not on file  Other Topics Concern  . Not on file  Social History Narrative   Widowed 2004. 1 son. 2 adopted grandchildren, 2 greatgrandkids. Assists in the care of autistic grandchild    [place] farm. Son lives close as well as grandson (next door). Completely independent. Still mows her yard.       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       Hobbies: reading, work outside      Scientist, physiological Strain: [place]   . Difficulty of Paying Living Expenses: Not hard at all  Food Insecurity: No Food Insecurity  . Worried About  Charity fundraiser in the Last Year: Never true  . Ran Out of Food in the Last Year: Never true  Transportation Needs: No Transportation Needs  . Lack of Transportation (Medical): No  . Lack of Transportation (Non-Medical): No  Physical Activity: Inactive  . Days of Exercise per Week: 0 days  . Minutes of Exercise per Session: 0 min  Stress: No Stress Concern Present  . Feeling of Stress : Not at all  Social Connections: Moderately Isolated  . Frequency of Communication with Friends and Family: More than three times a week  . Frequency of Social Gatherings with Friends and Family: Once a week  . Attends Religious Services: More than 4 times per year  . Active Member of Clubs or Organizations: No  . Attends Archivist Meetings: Never  . Marital Status: Widowed    Tobacco Counseling Counseling given: Not Answered   Clinical Intake:  Pre-visit preparation completed: Yes  Pain : No/denies pain     BMI - recorded: 21.48 Nutritional Status: BMI of 19-24  Normal Nutritional Risks: None Diabetes: No  How often do you need to have someone help you when you read instructions, pamphlets, or other written materials from your doctor or pharmacy?: 1 - Never  Diabetic? Educational Status    Risk to Self:    Risk to Others:    Abuse:    Prior Inpatient Therapy:    Prior Outpatient Therapy:    Additional Information:                       Has the patient had any N/V/D within the last 2 months?  No  Does the patient have any non-healing wounds?  No  Has the patient had any unintentional weight loss or weight gain?  No   Diabetes:  Is the patient diabetic?  Yes  If diabetic, was a CBG obtained today?  No  Did the patient bring in their glucometer from home?  No  How often do you monitor your CBG's? N/A.   Financial Strains and Diabetes Management:  Are you having any financial strains with the device, your supplies or your medication? No .  Does  the patient want to be seen by Chronic Care Management for management of their diabetes?  No  Would the patient like to be referred to a Nutritionist or for Diabetic Management?  No   Diabetic Exams:  Diabetic Eye Exam: Overdue for diabetic eye exam. Pt has been advised about the importance in completing this exam. Patient advised to call and schedule an eye exam. Diabetic Foot Exam: Completed 04/02/20   Interpreter Needed?: No  Information entered by :: Charlott Rakes, Linda Golden   Activities of Daily Living In your present state of health, do you have any difficulty performing the following  activities: 12/13/2020  Hearing? Y  Vision? N  Difficulty concentrating or making decisions? Y  Comment memory at times  Walking or climbing stairs? Y  Dressing or bathing? N  Doing errands, shopping? N  Preparing Food and eating ? N  Using the Toilet? N  In the past six months, have you accidently leaked urine? N  Do you have problems with loss of bowel control? N  Managing your Medications? N  Managing your Finances? N  Housekeeping or managing your Housekeeping? N  Some recent data might be hidden    Patient Care Team: Marin Olp, MD as PCP - General (Family Medicine)  Indicate any recent Medical Services you may have received from other than [facility] providers in the past year (date may be approximate).     Assessment:   This is a routine wellness examination for Linda Golden.  Hearing/Vision screen  Hearing Screening   125Hz  250Hz  500Hz  1000Hz  2000Hz  3000Hz  4000Hz  6000Hz  8000Hz   Right ear:           Left ear:           Comments: Pt stated hearing loss and don't wear hearing aids   Vision Screening Comments: Pt follows up with Dr Eulas Post for annual eye exams  Dietary issues and exercise activities discussed: Current Exercise Habits: The patient has a physically strenuous job, but has no regular exercise apart from work. (works on farm)  Goals    . Increase water intake     Will  drink water with your meals     . patient     Stay active and healthy    . Patient Stated     Stay healthy      Depression Screen PHQ 2/9 Scores 12/13/2020 11/06/2019 07/17/2019 12/20/2018 05/30/2017 06/28/2016 05/24/2015  PHQ - 2 Score 0 0 0 0 0 0 0    Fall Risk Fall Risk  12/13/2020 04/02/2020 04/02/2020 11/06/2019 12/20/2018  Falls in the past year? 0 0 0 0 0  Comment - - - - -  Number falls in past yr: 0 0 0 0 0  Injury with Fall? 0 0 0 0 0  Risk for fall due to : Impaired vision;Impaired balance/gait;Impaired mobility - - - -  Follow up Falls prevention discussed - - Falls evaluation completed;Education provided;Falls prevention discussed -    FALL RISK PREVENTION PERTAINING TO THE HOME:  Any stairs in or around the home? Yes  If so, are there any without handrails? No  Home free of loose throw rugs in walkways, pet beds, electrical cords, etc? Yes  Adequate lighting in your home to reduce risk of falls? Yes   ASSISTIVE DEVICES UTILIZED TO PREVENT FALLS:  Life alert? No  Use of a cane, walker or w/c? No  Grab bars in the bathroom? No  Shower chair or bench in shower? No  Elevated toilet seat or a handicapped toilet? No   TIMED UP AND GO:  Was the test performed? No    Cognitive Function: Pt is HOH /no 6CIT done     6CIT Screen 11/06/2019 05/30/2017  What Year? 0 points 0 points  What month? 0 points 0 points  What time? 0 points 0 points  Count back from 20 0 points 0 points  Months in reverse 0 points 0 points  Repeat phrase 0 points 0 points  Total Score 0 0    Immunizations Immunization History  Administered Date(s) Administered  . Fluad Quad(high Dose 65+) 08/24/2020  .  Influenza Split 07/27/2011, 07/08/2012  . Influenza Whole 07/16/2006, 08/22/2007, 07/01/2008, 07/30/2009, 06/29/2010  . Influenza, High Dose Seasonal PF 08/18/2016, 08/03/2017, 08/19/2018  . Influenza,inj,Quad PF,6+ Mos 08/01/2013, 08/05/2014, 07/13/2015  . Moderna Sars-Covid-2 Vaccination  12/11/2019, 01/09/2020, 09/30/2020  . Pneumococcal Conjugate-13 05/24/2015  . Pneumococcal Polysaccharide-23 02/14/1999  . Td 10/16/2005  . Tdap 03/01/2018    TDAP status: Up to date  Flu Vaccine status: Up to date  Pneumococcal vaccine status: Up to date  Covid-19 vaccine status: Completed vaccines  Qualifies for Shingles Vaccine? Yes   Zostavax completed No   Shingrix Completed?: No.    Education has been provided regarding the importance of this vaccine. Patient has been advised to call insurance company to determine out of pocket expense if they have not yet received this vaccine. Advised may also receive vaccine at local pharmacy or Health Dept. Verbalized acceptance and understanding.  Screening Tests Health Maintenance  Topic Date Due  . OPHTHALMOLOGY EXAM  08/21/2020  . FOOT EXAM  04/02/2021  . HEMOGLOBIN A1C  04/05/2021  . TETANUS/TDAP  03/01/2028  . INFLUENZA VACCINE  Completed  . COVID-19 Vaccine  Completed  . PNA vac Low Risk Adult  Completed  . [place] Maintenance  Health Maintenance Due  Topic Date Due  . OPHTHALMOLOGY EXAM  08/21/2020    Colorectal cancer screening: No longer required.   Mammogram status: No longer required due to age.  Additional Screening:  Vision Screening: Recommended annual ophthalmology exams for early detection of glaucoma and other disorders of the eye. Is the patient up to date with their annual eye exam?  Yes  Who is the provider or what is the name of the office in which the patient attends annual eye exams? Dr Eulas Post If pt is not established with a provider, would they like to be referred to a provider to establish care? No .   Dental Screening: Recommended annual dental exams for proper oral hygiene  Community Resource Referral / Chronic Care Management: CRR required this visit?  No   CCM required this visit?  No      Plan:     I have personally reviewed and noted the following in the  patient's chart:   . Medical and social history . Use of alcohol, tobacco or illicit drugs  . Current medications and supplements . Functional ability and status . Nutritional status . Physical activity . Advanced directives . List of other physicians . Hospitalizations, surgeries, and ER visits in previous 12 months . Vitals . Screenings to include cognitive, depression, and falls . Referrals and appointments  In addition, I have reviewed and discussed with patient certain preventive protocols, quality metrics, and best practice recommendations. A written personalized care plan for preventive services as well as general preventive health recommendations were provided to patient.     Linda Brace, Linda Golden   05/24/9832   Nurse Notes: None

## 2020-12-16 DIAGNOSIS — C44519 Basal cell carcinoma of skin of other part of trunk: Secondary | ICD-10-CM | POA: Diagnosis not present

## 2020-12-18 NOTE — Progress Notes (Signed)
I have personally reviewed the Medicare Annual Wellness Visit and agree with the documentation.  Algis Greenhouse. Jerline Pain, MD 12/18/2020 10:47 AM

## 2021-01-17 DIAGNOSIS — Z08 Encounter for follow-up examination after completed treatment for malignant neoplasm: Secondary | ICD-10-CM | POA: Diagnosis not present

## 2021-01-17 DIAGNOSIS — Z85828 Personal history of other malignant neoplasm of skin: Secondary | ICD-10-CM | POA: Diagnosis not present

## 2021-02-01 ENCOUNTER — Telehealth: Payer: Self-pay | Admitting: Family Medicine

## 2021-02-01 NOTE — Progress Notes (Signed)
  Chronic Care Management   Outreach Note  02/01/2021 Name: Linda Golden MRN: [id number] DOB: 1926-12-05  Referred by: Marin Olp, MD Reason for referral : No chief complaint on file.   An unsuccessful telephone outreach was attempted today. The patient was referred to the pharmacist for assistance with care management and care coordination.   Follow Up Plan:   Lauretta Grill Upstream Scheduler

## 2021-02-11 DIAGNOSIS — E1342 Other specified diabetes mellitus with diabetic polyneuropathy: Secondary | ICD-10-CM | POA: Diagnosis not present

## 2021-02-11 DIAGNOSIS — L84 Corns and callosities: Secondary | ICD-10-CM | POA: Diagnosis not present

## 2021-02-11 DIAGNOSIS — B351 Tinea unguium: Secondary | ICD-10-CM | POA: Diagnosis not present

## 2021-04-07 ENCOUNTER — Ambulatory Visit: Payer: PPO | Admitting: Family Medicine

## 2021-04-07 NOTE — Progress Notes (Incomplete)
Phone [phone] In person visit   Subjective:   Linda Golden is a 87 y.o. year old very pleasant female patient who presents for/with See problem oriented charting No chief complaint on file.   This visit occurred during the SARS-CoV-2 public health emergency.  Safety protocols were in place, including screening questions prior to the visit, additional usage of staff PPE, and extensive cleaning of exam room while observing appropriate contact time as indicated for disinfecting solutions.   Past Medical History-  Patient Active Problem List   Diagnosis Date Noted   Diverticulitis of intestine with abscess 07/03/2015   CKD (chronic kidney disease), stage III ([place]) 10/23/2014   Raynaud's disease 10/08/2013   Osteoarthritis 07/01/2008   Varicose veins 11/04/2007   Controlled type 2 diabetes mellitus with renal manifestation ([place]) 04/18/2007   Hyperlipidemia associated with type 2 diabetes mellitus ([place]) 04/18/2007   Hypertension associated with diabetes ([place]) 04/18/2007   Osteopenia 04/18/2007    Medications- reviewed and updated Current Outpatient Medications  Medication Sig Dispense Refill   amLODipine (NORVASC) 2.5 MG tablet Take 1 tablet (2.5 mg total) by mouth daily. 90 tablet 3   glucose blood test strip Up to 4 times a day Dx E11.29 100 each 2   hydrochlorothiazide (HYDRODIURIL) 25 MG tablet Take one tablet by mouth with Losartan. 90 tablet 3   losartan (COZAAR) 100 MG tablet Take 1 tablet (100 mg total) by mouth daily. 90 tablet 3   tobramycin (TOBREX) 0.3 % ophthalmic solution INSTILL 1 DROP INTO THEALEFT EYE FOUR TIMES DAILY x FIVE DAYS.     No current facility-administered medications for this visit.     Objective:  There were no vitals taken for this visit. Gen: NAD, resting comfortably CV: RRR no murmurs rubs or gallops Lungs: CTAB no crackles, wheeze, rhonchi Abdomen: soft/nontender/nondistended/normal bowel sounds. No rebound or guarding.  Ext: no  edema Skin: warm, dry Neuro: grossly normal, moves all extremities     Assessment and Plan  #Diabetes S: Diet controlled. CBGs***  Lab Results  Component Value Date   HGBA1C 6.8 (H) 10/05/2020   HGBA1C 6.1 (A) 04/02/2020   HGBA1C 6.9 (H) 04/28/2019   A/P: ***   #Hypertension/CKD stage III S: Compliant with amlodipine 2.5 mg, losartan 100 mg, and  hydrochlorothiazide 100-25 mg (right now pill is being split). GFR is have been largely stable in the 50s. Knows to avoid NSAIDs. On ACE-i case proteinuric element. *** BP Readings from Last 3 Encounters:  10/05/20 124/64  04/02/20 128/68  07/17/19 136/78   A/P: ***   #hyperlipidemia S: Medication: none ***. had hair loss in the past on statin.  - some coronary artery calcifications on 2016 CT-does not want to try Zetia either Lab Results  Component Value Date   CHOL 255 (H) 04/02/2020   HDL 40.80 04/02/2020   LDLCALC 161 (H) 03/01/2018   LDLDIRECT 180.0 04/02/2020   TRIG 257.0 (H) 04/02/2020   CHOLHDL 6 04/02/2020   A/P: *** -Possible Zetia?***   Recommended follow up: No follow-ups on file. Future Appointments  Date Time Provider [place]  04/07/2021  1:40 PM Marin Olp, MD [facility]  12/26/2021 11:45 AM [facility] HEALTH COACH [facility]    Lab/Order associations: No diagnosis found.  No orders of the defined types were placed in this encounter.  I,Harris Phan,acting as a Education administrator for [name], MD.,have documented all relevant documentation on the behalf of [name], MD,as directed by  [name], MD while in the  presence of [name], MD.    ***  Return precautions advised.  Pamella Pert

## 2021-04-13 ENCOUNTER — Encounter: Payer: Self-pay | Admitting: Family Medicine

## 2021-04-17 ENCOUNTER — Other Ambulatory Visit: Payer: Self-pay

## 2021-04-17 ENCOUNTER — Ambulatory Visit
Admission: EM | Admit: 2021-04-17 | Discharge: 2021-04-17 | Disposition: A | Discharge status: Home / Self Care | Payer: PPO | Attending: Family Medicine | Admitting: Family Medicine

## 2021-04-17 ENCOUNTER — Encounter: Payer: Self-pay | Admitting: Emergency Medicine

## 2021-04-17 DIAGNOSIS — L249 Irritant contact dermatitis, unspecified cause: Secondary | ICD-10-CM

## 2021-04-17 MED ORDER — TRIAMCINOLONE ACETONIDE 0.025 % EX OINT
1.0000 | TOPICAL_OINTMENT | Freq: Two times a day (BID) | CUTANEOUS | 0 refills | Status: DC | PRN
Start: 2021-04-17 — End: 2022-05-19

## 2021-04-17 MED ORDER — DEXAMETHASONE 1 MG/ML PO CONC
5.0000 mg | Freq: Once | ORAL | Status: AC
  Administered 2021-04-17: 09:00:00 5 mg via ORAL

## 2021-04-17 NOTE — ED Triage Notes (Addendum)
Itchy rash to bilateral arms x 1 week.  Pt reports she was working in some shrubs last week.

## 2021-04-17 NOTE — ED Provider Notes (Signed)
[facility]    CSN: [id number] Arrival date & time: 04/17/21  0811      History   Chief Complaint Chief Complaint  Patient presents with   Rash    HPI Linda Golden is a 87 y.o. female.   HPI Patient presents today with a rash on bilateral upper extremities.  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  The rash is been present for a week and is very itchy and has spread to different locations involving the upper extremities and 1 small patch on the lower left leg. Denies any changes to soaps, detergents or changes in diet. 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   Diabetes mellitus    Hyperlipidemia    Hypertension    Kidney mass    was told was born with this   Osteopenia     Patient Active Problem List   Diagnosis Date Noted   Diverticulitis of intestine with abscess 07/03/2015   CKD (chronic kidney disease), stage III ([place]) 10/23/2014   Raynaud's disease 10/08/2013   Osteoarthritis 07/01/2008   Varicose veins 11/04/2007   Controlled type 2 diabetes mellitus with renal manifestation ([place]) 04/18/2007   Hyperlipidemia associated with type 2 diabetes mellitus ([place]) 04/18/2007   Hypertension associated with diabetes ([place]) 04/18/2007   Osteopenia 04/18/2007    Past Surgical History:  Procedure Laterality Date   CATARACT EXTRACTION     bilateral planned-right done, left soon   foot spur      OB History   No obstetric history on file.      Home Medications    Prior to Admission medications   Medication Sig Start Date End Date Taking? Authorizing Provider  triamcinolone (KENALOG) 0.025 % ointment Apply 1 application topically 2 (two) times daily as needed. 04/17/21  Yes Scot Jun, FNP  amLODipine (NORVASC) 2.5 MG tablet Take 1 tablet (2.5 mg total) by mouth daily. 10/05/20   Marin Olp, MD  glucose blood test strip Up to 4 times a day Dx E11.29 09/08/19   Marin Olp, MD  hydrochlorothiazide (HYDRODIURIL) 25 MG tablet Take one tablet by mouth with Losartan. 10/05/20   Marin Olp, MD  losartan (COZAAR) 100 MG tablet Take 1 tablet (100 mg total) by mouth daily. 10/05/20   Marin Olp, MD  tobramycin (TOBREX) 0.3 % ophthalmic solution INSTILL 1 DROP INTO THEALEFT EYE FOUR TIMES DAILY x FIVE DAYS. 11/25/19   [provider]    Family History Family History  Problem Relation Age of Onset   Hypertension Father    Alzheimer's disease Mother     Social History Social History   Tobacco Use   Smoking status: Never   Smokeless tobacco: Never  Substance Use Topics   Alcohol use: No   Drug use: No     Allergies   Lisinopril, Statins, and Sulfa antibiotics   Review of Systems Review of Systems Pertinent negatives listed in HPI  Physical Exam Triage Vital Signs ED Triage Vitals  Enc Vitals Group     BP 04/17/21 0824 (!) 153/66     Pulse Rate 04/17/21 0824 79     Resp 04/17/21 0824 18     Temp 04/17/21 0824 98 F (36.7 C)     Temp Source 04/17/21 0824 Oral     SpO2 04/17/21 0824 98 %     Weight --  Height --      Head Circumference --      Peak Flow --      Pain Score 04/17/21 0825 0     Pain Loc --      Pain Edu? --      Excl. in [place]? --    No data found.  Updated Vital Signs BP (!) 153/66 (BP Location: Right Arm)   Pulse 79   Temp 98 F (36.7 C) (Oral)   Resp 18   SpO2 98%   Visual Acuity Right Eye Distance:   Left Eye Distance:   Bilateral Distance:    Right Eye Near:   Left Eye Near:    Bilateral Near:     Physical Exam General appearance: alert, well developed, well nourished, cooperative  Head: Normocephalic, without obvious abnormality, atraumatic Respiratory: Respirations even and unlabored, normal respiratory rate Heart: Rate and rhythm normal. No gallop or murmurs noted on exam  Extremities: No gross deformities Skin: Skin color, texture, turgor normal. Erythematous blistery  rash BUE and L left extremity  Psych: Appropriate mood and affect. Neurologic: GCS 15, normal coordination, normal gait UC Treatments / Results  Labs (all labs ordered are listed, but only abnormal results are displayed) Labs Reviewed - No data to display  EKG   Radiology No results found.  Procedures Procedures (including critical care time)  Medications Ordered in UC Medications  dexamethasone (DECADRON) 1 MG/ML solution 5 mg (has no administration in time range)    Initial Impression / Assessment and Plan / UC Course  I have reviewed the triage vital signs and the nursing notes.  Pertinent labs & imaging results that were available during my care of the patient were reviewed by me and considered in my medical decision making (see chart for details).     Irritant contact dermatitis appearance is suspicious for possible outbreak associated with poison ivy.  Treatment today with Decadron 5 mg  PO given here in clinic.  Patient will also apply triamcinolone cream twice daily to the affected areas.  Return precautions given.  Final Clinical Impressions(s) / UC Diagnoses   Final diagnoses:  Irritant contact dermatitis, unspecified trigger     Discharge Instructions      Apply cream to rash twice daily for the next 7 days or until rash completely resolves.   ED Prescriptions     Medication Sig Dispense Auth. Provider   triamcinolone (KENALOG) 0.025 % ointment Apply 1 application topically 2 (two) times daily as needed. 60 g Scot Jun, FNP      PDMP not reviewed this encounter.   Scot Jun, [place] 04/18/21 [phone]

## 2021-04-17 NOTE — Discharge Instructions (Signed)
Apply cream to rash twice daily for the next 7 days or until rash completely resolves.

## 2021-04-22 ENCOUNTER — Other Ambulatory Visit: Payer: Self-pay

## 2021-04-22 ENCOUNTER — Encounter: Payer: Self-pay | Admitting: Family Medicine

## 2021-04-22 ENCOUNTER — Ambulatory Visit (INDEPENDENT_AMBULATORY_CARE_PROVIDER_SITE_OTHER): Payer: PPO | Admitting: Family Medicine

## 2021-04-22 VITALS — BP 138/70 | HR 70 | Temp 98.3°F | Ht 63.0 in | Wt 122.2 lb

## 2021-04-22 DIAGNOSIS — E785 Hyperlipidemia, unspecified: Secondary | ICD-10-CM

## 2021-04-22 DIAGNOSIS — E1122 Type 2 diabetes mellitus with diabetic chronic kidney disease: Secondary | ICD-10-CM | POA: Diagnosis not present

## 2021-04-22 DIAGNOSIS — I152 Hypertension secondary to endocrine disorders: Secondary | ICD-10-CM

## 2021-04-22 DIAGNOSIS — E1169 Type 2 diabetes mellitus with other specified complication: Secondary | ICD-10-CM

## 2021-04-22 DIAGNOSIS — E1159 Type 2 diabetes mellitus with other circulatory complications: Secondary | ICD-10-CM

## 2021-04-22 DIAGNOSIS — N183 Chronic kidney disease, stage 3 unspecified: Secondary | ICD-10-CM | POA: Diagnosis not present

## 2021-04-22 DIAGNOSIS — B351 Tinea unguium: Secondary | ICD-10-CM | POA: Diagnosis not present

## 2021-04-22 DIAGNOSIS — E1342 Other specified diabetes mellitus with diabetic polyneuropathy: Secondary | ICD-10-CM | POA: Diagnosis not present

## 2021-04-22 DIAGNOSIS — L84 Corns and callosities: Secondary | ICD-10-CM | POA: Diagnosis not present

## 2021-04-22 NOTE — Patient Instructions (Addendum)
Health Maintenance Due  Topic Date Due   OPHTHALMOLOGY EXAM appointment scheduled in November but had one last November- Sign release of information at the check out desk for last diabetic eye exam November 2021 08/21/2020   COVID-19 Vaccine (4 - Booster for [employer] series) Will get this schedule at the Health center in [place].  01/29/2021   Please stop by lab before you go If you have mychart- we will send your results within 3 business days of [place] receiving them.  If you do not have mychart- we will call you about results within 5 business days of [place] receiving them.  *please also note that you will see labs on mychart as soon as they post. I will later go in and write notes on them- will say "notes from Dr. Yong Channel"  No changes today unless labs lead [place] to make changes  Recommended follow up: Return in about 6 months (around 10/23/2021) for physical or sooner if needed.

## 2021-04-22 NOTE — Progress Notes (Signed)
Phone [phone] In person visit   Subjective:   Linda Golden is a 87 y.o. year old very pleasant female patient who presents for/with See problem oriented charting Chief Complaint  Patient presents with   Hypertension   Hyperlipidemia   Diabetes    This visit occurred during the SARS-CoV-2 public health emergency.  Safety protocols were in place, including screening questions prior to the visit, additional usage of staff PPE, and extensive cleaning of exam room while observing appropriate contact time as indicated for disinfecting solutions.   Past Medical History-  Patient Active Problem List   Diagnosis Date Noted   Controlled type 2 diabetes mellitus with renal manifestation ([place]) 04/18/2007    Priority: High   Diverticulitis of intestine with abscess 07/03/2015    Priority: Medium   CKD (chronic kidney disease), stage III ([place]) 10/23/2014    Priority: Medium   Hyperlipidemia associated with type 2 diabetes mellitus ([place]) 04/18/2007    Priority: Medium   Hypertension associated with diabetes ([place]) 04/18/2007    Priority: Medium   Raynaud's disease 10/08/2013    Priority: Low   Osteoarthritis 07/01/2008    Priority: Low   Varicose veins 11/04/2007    Priority: Low   Osteopenia 04/18/2007    Priority: Low    Medications- reviewed and updated Current Outpatient Medications  Medication Sig Dispense Refill   amLODipine (NORVASC) 2.5 MG tablet Take 1 tablet (2.5 mg total) by mouth daily. 90 tablet 3   hydrochlorothiazide (HYDRODIURIL) 25 MG tablet Take one tablet by mouth with Losartan. 90 tablet 3   losartan (COZAAR) 100 MG tablet Take 1 tablet (100 mg total) by mouth daily. 90 tablet 3   triamcinolone (KENALOG) 0.025 % ointment Apply 1 application topically 2 (two) times daily as needed. 60 g 0   No current facility-administered medications for this visit.     Objective:  BP 138/70   Pulse 70   Temp 98.3 F (36.8 C) (Temporal)   Ht 5\' 3"  (1.6 m)   Wt 122 lb  3.2 oz (55.4 kg)   SpO2 97%   BMI 21.65 kg/m  Gen: NAD, resting comfortably CV: RRR no murmurs rubs or gallops Lungs: CTAB no crackles, wheeze, rhonchi Ext: no edema Skin: warm, dry Neuro:  hard of hearing    Assessment and Plan    #Diabetes S: Diet controlled.  CBGs- doesn't routinely check  Lab Results  Component Value Date   HGBA1C 6.8 (H) 10/05/2020  A/P: hopefully stable- update a1c today. Continue withoutmeds for now    #Hypertension/CKD stage III S: Compliant with amlodipine 2.5 mg,losartan hydrochlorothiazide 100-25 mg (right now pill is being split).  GFR is have been largely stable in the 50s.  Knows to avoid NSAIDs.  On ACE-i case proteinuric element.  BP Readings from Last 3 Encounters:  04/22/21 138/70  04/17/21 (!) 153/66  10/05/20 124/64  A/P: Stable hypertension, hopefully stable CKD- update CMP. Continue current medications.     #hyperlipidemia S: Medication: none. had hair loss in the past on statin.  - some coronary artery calcifications on 2016 CT-does not want to try Zetia either Lab Results  Component Value Date   CHOL 255 (H) 04/02/2020   HDL 40.80 04/02/2020   LDLCALC 161 (H) 03/01/2018   LDLDIRECT 180.0 04/02/2020   TRIG 257.0 (H) 04/02/2020   CHOLHDL 6 04/02/2020   A/P:  lipids poorly controlled but at her age unlikely to start statin especially with prior side effects without CAD history- update lipids  nad focus on lifestyle changes  #Patient had an appointment on what she believes was June 25-she call to confirm this appointment and this date was confirmed.  Apparently her actual appointment was on June 23-there may have been some miscommunication on the phone-patient should not be charged a no-show fee-I am going to ask our staff to have this written off   Recommended follow up: Return in about 6 months (around 10/23/2021) for physical or sooner if needed. Future Appointments  Date Time Provider Reform  12/26/2021 11:45 AM  [facility] HEALTH COACH [facility]    Lab/Order associations:   ICD-10-CM   1. Controlled type 2 diabetes mellitus with stage 3 chronic kidney disease, without long-term current use of insulin (HCC)  E11.22 CBC With Differential/Platelet   Z61.09 COMPLETE METABOLIC PANEL WITH GFR    Hemoglobin A1c    Lipid Panel w/reflex Direct LDL    2. Hypertension associated with diabetes ([place])  E11.59    I15.2     3. Hyperlipidemia associated with type 2 diabetes mellitus ([place])  E11.69    E78.5     4. Stage 3 chronic kidney disease, unspecified whether stage 3a or 3b CKD ([place])  N18.30       No orders of the defined types were placed in this encounter.   Return precautions advised.  [name], MD

## 2021-04-22 NOTE — Addendum Note (Signed)
Addended by: Loura Back on: 04/22/2021 03:54 PM   Modules accepted: Orders

## 2021-04-23 LAB — COMPLETE METABOLIC PANEL WITH GFR
AG Ratio: 1.7 (calc) (ref 1.0–2.5)
ALT: 14 U/L (ref 6–29)
AST: 14 U/L (ref 10–35)
Albumin: 4.4 g/dL (ref 3.6–5.1)
Alkaline phosphatase (APISO): 67 U/L (ref 37–153)
BUN/Creatinine Ratio: 26 (calc) — ABNORMAL HIGH (ref 6–22)
BUN: 31 mg/dL — ABNORMAL HIGH (ref 7–25)
CO2: 28 mmol/L (ref 20–32)
Calcium: 9.7 mg/dL (ref 8.6–10.4)
Chloride: 104 mmol/L (ref 98–110)
Creat: 1.19 mg/dL — ABNORMAL HIGH (ref 0.60–0.88)
GFR, Est African American: 46 mL/min/{1.73_m2} — ABNORMAL LOW (ref >=60)
GFR, Est Non African American: 40 mL/min/{1.73_m2} — ABNORMAL LOW (ref >=60)
Globulin: 2.6 g/dL (calc) (ref 1.9–3.7)
Glucose, Bld: 132 mg/dL — ABNORMAL HIGH (ref 65–99)
Potassium: 4.4 mmol/L (ref 3.5–5.3)
Sodium: 143 mmol/L (ref 135–146)
Total Bilirubin: 0.4 mg/dL (ref 0.2–1.2)
Total Protein: 7 g/dL (ref 6.1–8.1)

## 2021-04-23 LAB — CBC WITH DIFFERENTIAL/PLATELET
Absolute Monocytes: 804 cells/uL (ref 200–950)
Basophils Absolute: 49 cells/uL (ref 0–200)
Basophils Relative: 0.5 %
Eosinophils Absolute: 176 cells/uL (ref 15–500)
Eosinophils Relative: 1.8 %
HCT: 39.7 % (ref 35.0–45.0)
Hemoglobin: 13.3 g/dL (ref 11.7–15.5)
Lymphs Abs: 3234 cells/uL (ref 850–3900)
MCH: 29.6 pg (ref 27.0–33.0)
MCHC: 33.5 g/dL (ref 32.0–36.0)
MCV: 88.2 fL (ref 80.0–100.0)
MPV: 10.5 fL (ref 7.5–12.5)
Monocytes Relative: 8.2 %
Neutro Abs: 5537 cells/uL (ref 1500–7800)
Neutrophils Relative %: 56.5 %
Platelets: 253 10*3/uL (ref 140–400)
RBC: 4.5 10*6/uL (ref 3.80–5.10)
RDW: 12.8 % (ref 11.0–15.0)
Total Lymphocyte: 33 %
WBC: 9.8 10*3/uL (ref 3.8–10.8)

## 2021-04-23 LAB — LIPID PANEL W/REFLEX DIRECT LDL
Cholesterol: 265 mg/dL — ABNORMAL HIGH (ref <200)
HDL: 45 mg/dL — ABNORMAL LOW (ref >=50)
Non-HDL Cholesterol (Calc): 220 mg/dL (calc) — ABNORMAL HIGH (ref <130)
Total CHOL/HDL Ratio: 5.9 (calc) — ABNORMAL HIGH (ref <5.0)
Triglycerides: 402 mg/dL — ABNORMAL HIGH (ref <150)

## 2021-04-23 LAB — HEMOGLOBIN A1C
Hgb A1c MFr Bld: 6.6 % of total Hgb — ABNORMAL HIGH (ref <5.7)
Mean Plasma Glucose: 143 mg/dL
eAG (mmol/L): 7.9 mmol/L

## 2021-04-23 LAB — DIRECT LDL: Direct LDL: 172 mg/dL — ABNORMAL HIGH (ref <100)

## 2021-05-05 ENCOUNTER — Telehealth: Payer: Self-pay | Admitting: Family Medicine

## 2021-05-05 NOTE — Chronic Care Management (AMB) (Signed)
  Chronic Care Management   Note  05/05/2021 Name: [name] MRN: [id number] DOB: Apr 10, 1927  [name] is a 87 y.o. year old female who is a primary care patient of Yong Channel, Brayton Mars, MD. I reached out to Briant Cedar by phone today in response to a referral sent by [name]'s PCP, Marin Olp, MD.   [name] was given information about Chronic Care Management services today including:  CCM service includes personalized support from designated clinical staff supervised by her physician, including individualized plan of care and coordination with other care providers 24/7 contact phone numbers for assistance for urgent and routine care needs. Service will only be billed when office clinical staff spend 20 minutes or more in a month to coordinate care. Only one practitioner may furnish and bill the service in a calendar month. The patient may stop CCM services at any time (effective at the end of the month) by phone call to the office staff.   Patient agreed to services and verbal consent obtained.   Follow up plan:   Lauretta Grill Upstream Scheduler

## 2021-05-26 ENCOUNTER — Telehealth: Payer: Self-pay | Admitting: Pharmacist

## 2021-05-26 DIAGNOSIS — X32XXXD Exposure to sunlight, subsequent encounter: Secondary | ICD-10-CM | POA: Diagnosis not present

## 2021-05-26 DIAGNOSIS — Z08 Encounter for follow-up examination after completed treatment for malignant neoplasm: Secondary | ICD-10-CM | POA: Diagnosis not present

## 2021-05-26 DIAGNOSIS — L570 Actinic keratosis: Secondary | ICD-10-CM | POA: Diagnosis not present

## 2021-05-26 DIAGNOSIS — Z85828 Personal history of other malignant neoplasm of skin: Secondary | ICD-10-CM | POA: Diagnosis not present

## 2021-05-26 NOTE — Chronic Care Management (AMB) (Signed)
    Chronic Care Management Pharmacy Assistant   Name: Linda Golden  MRN: [id number] DOB: 1927/05/27  Linda Golden is an 87 y.o. year old female who presents for his initial CCM visit with the clinical pharmacist.  Reason for Encounter: Chart Review For Initial Visit With Clinical Pharmacist   Conditions to be addressed/monitored: HTN, Diverticulitis, DM, HLD, OA, Osteopenia, CKD  Primary concerns for visit include: HTN, DM, HLD, OA, Osteopenia, CKD  Recent office visits:  04/22/2021 OV (PCP) Marin Olp, MD; chronic follow up, no medication changes indicated.  04/17/2021 Urgent Care Visit; Rx Decadron and Triamcinolone cream for poison ivy.  12/13/2020 OV Dimas Chyle; no further information available.  Recent consult visits:  02/11/2021 OV (podiatry) Caprice Beaver, no further information available.  Hospital visits:  None in previous 6 months  Medications: Outpatient Encounter Medications as of 05/26/2021  Medication Sig   amLODipine (NORVASC) 2.5 MG tablet Take 1 tablet (2.5 mg total) by mouth daily.   hydrochlorothiazide (HYDRODIURIL) 25 MG tablet Take one tablet by mouth with Losartan.   losartan (COZAAR) 100 MG tablet Take 1 tablet (100 mg total) by mouth daily.   triamcinolone (KENALOG) 0.025 % ointment Apply 1 application topically 2 (two) times daily as needed.   No facility-administered encounter medications on file as of 05/26/2021.    Current Medications: Triamcinolone 0.025% ointment Amlodipine 2.5 mg last filled 03/03/2021 90 DS HCTZ 25 mg last filled 03/03/2021 90 DS Losartan 100 mg last filled 03/03/2021 90 DS  Have you seen any other providers since your last visit?   Any changes in your medications or health?  Any side effects from any medications?   Do you have any symptoms or problems not managed by your medications?  Any concerns about your health right now?  Has your provider asked that you check blood pressure, blood sugar, or  follow special diet at home?  Do you get any type of exercise on a regular basis?  Can you think of a goal you would like to reach for your health?  Do you have any problems getting your medications?  Is there anything that you would like to discuss during the appointment?   Please bring medications and supplements to appointment  Future Appointments  Date Time Provider [place]  05/31/2021 11:00 AM [facility] CCM PHARMACIST [facility]  11/01/2021 10:40 AM Marin Olp, MD [facility]  12/26/2021 11:45 AM [facility] HEALTH COACH [facility]    Star Rating Drugs: Losartan 100 mg last filled 03/03/2021 90 DS  **Patient cancelled her appointment for this initial call. Chart Review completed.  April D Calhoun, [place] Pharmacist Assistant [phone]

## 2021-05-31 ENCOUNTER — Telehealth: Payer: PPO

## 2021-06-13 ENCOUNTER — Telehealth: Payer: Self-pay

## 2021-06-13 NOTE — Telephone Encounter (Signed)
Patient has an appointment tomorrow with Linda Golden.   Nurse Assessment Nurse: Ronnald Ramp, RN, Miranda Date/Time (Eastern Time): 06/13/2021 10:27:33 AM Confirm and document reason for call. If symptomatic, describe symptoms. ---Caller states she fell 1 week ago. She landed on her right knee. She has had some mild pain. Today her knee is warm compared to the left knee. Does the patient have any new or worsening symptoms? ---Yes Will a triage be completed? ---Yes Related visit to physician within the last 2 weeks? ---No Does the PT have any chronic conditions? (i.e. diabetes, asthma, this includes High risk factors for pregnancy, etc.) ---Yes List chronic conditions. ---HTN Is this a behavioral health or substance abuse call? ---No Guidelines Guideline Title Affirmed Question Affirmed Notes Nurse Date/Time (Eastern Time) Knee Injury [1] After 3 days AND [2] pain not improved Ronnald Ramp, RN, Miranda 06/13/2021 10:30:07 AM Disp. Time Eilene Ghazi Time) Disposition Final User 06/13/2021 10:34:06 AM SEE PCP WITHIN 3 DAYS Yes Ronnald Ramp, RN, Miranda PLEASE NOTE: All timestamps contained within this report are represented as [place] Standard Time. CONFIDENTIALTY NOTICE: This fax transmission is intended only for the addressee. It contains information that is legally privileged, confidential or otherwise protected from use or disclosure. If you are not the intended recipient, you are strictly prohibited from reviewing, disclosing, copying using or disseminating any of this information or taking any action in reliance on or regarding this information. If you have received this fax in error, please notify us immediately by telephone so that we can arrange for its return to [place]. Phone: [phone], Toll-Free: [phone], Fax: [phone] Page: 2 of 2 Call Id: [id number] [place] Disagree/Comply Comply Caller Understands Yes PreDisposition Call Doctor Care Advice Given Per Guideline SEE PCP WITHIN 3 DAYS: * PCP  VISIT: Call your doctor (or NP/PA) during regular office hours and make an appointment. A clinic or urgent care center are good places to go for care if your doctor's office is closed or you can't get an appointment. NOTE: If office will be open tomorrow, tell caller to call then, not in 3 days. * You need to be seen within 2 or 3 days. PAIN MEDICINES: * For pain relief, you can take either acetaminophen, ibuprofen, or naproxen. * They are over-the-counter (OTC) pain drugs. You can buy them at the drugstore. CALL BACK IF: * You become worse CARE ADVICE given per Knee Injury (Adult) guideline. USE HEAT ON AREA AFTER 48 HOURS: * If pain, swelling, or bruising last more than 48 hours (2 days), then use heat on the area. * Use a heat pack, heating pad, or warm wet washcloth. * Do this for 10 minutes three times a day. * This will help increase blood flow and improve healin

## 2021-06-13 NOTE — Telephone Encounter (Signed)
Noted  

## 2021-06-14 ENCOUNTER — Encounter: Payer: Self-pay | Admitting: Physician Assistant

## 2021-06-14 ENCOUNTER — Ambulatory Visit (INDEPENDENT_AMBULATORY_CARE_PROVIDER_SITE_OTHER)
Admission: RE | Admit: 2021-06-14 | Discharge: 2021-06-14 | Disposition: A | Discharge status: Home / Self Care | Payer: PPO | Source: Ambulatory Visit | Attending: Physician Assistant | Admitting: Physician Assistant

## 2021-06-14 ENCOUNTER — Ambulatory Visit (INDEPENDENT_AMBULATORY_CARE_PROVIDER_SITE_OTHER): Payer: PPO | Admitting: Physician Assistant

## 2021-06-14 ENCOUNTER — Other Ambulatory Visit: Payer: Self-pay

## 2021-06-14 VITALS — BP 183/71 | HR 82 | Temp 97.9°F | Ht 63.0 in | Wt 122.0 lb

## 2021-06-14 DIAGNOSIS — W19XXXA Unspecified fall, initial encounter: Secondary | ICD-10-CM

## 2021-06-14 DIAGNOSIS — M1711 Unilateral primary osteoarthritis, right knee: Secondary | ICD-10-CM | POA: Diagnosis not present

## 2021-06-14 DIAGNOSIS — M79621 Pain in right upper arm: Secondary | ICD-10-CM | POA: Diagnosis not present

## 2021-06-14 DIAGNOSIS — M25561 Pain in right knee: Secondary | ICD-10-CM

## 2021-06-14 NOTE — Progress Notes (Signed)
Acute Office Visit  Subjective:    Patient ID: Linda Golden, female    DOB: 30-Jan-1927, 87 y.o.   MRN: [id number]  Chief Complaint  Patient presents with   Fall    Right Knee Pain and Right Arm    HPI Patient is in today for right knee pain and some R upper arm pain since trip and fall 06/05/21.  Patient states that she was walking in her kitchen when her shoe got caught on the floor and she fell, landing on her right knee and her right upper arm.  She did not hit her head and she did not lose consciousness.  She is not on any blood thinners.  She states that she was able to get up right away after the fall and she carried on with her work that day.  She also says she continued to get around well for the rest of the week.  It was not until the last couple of days that her right knee really started to bother her and feels swollen and pain on the front of the knee.  Her right upper arm just feels sore, but she says she has been moving it without any problems and does not have any bruising or other marks around the area.   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   Diabetes mellitus    Hyperlipidemia    Hypertension    Kidney mass    was told was born with this   Osteopenia     Past Surgical History:  Procedure Laterality Date   CATARACT EXTRACTION     bilateral planned-right done, left soon   foot spur      Family History  Problem Relation Age of Onset   Hypertension Father    Alzheimer's disease Mother     Social History   Socioeconomic History   Marital status: Widowed    Spouse name: Not on file   Number of children: Not on file   Years of education: Not on file   Highest education level: Not on file  Occupational History   Not on file  Tobacco Use   Smoking status: Never   Smokeless tobacco: Never  Substance and Sexual Activity   Alcohol use: No   Drug use: No   Sexual activity: Not on file  Other Topics Concern    Not on file  Social History Narrative   Widowed 2004. 1 son. 2 adopted grandchildren, 2 greatgrandkids. Assists in the care of autistic grandchild    [place] farm. Son lives close as well as grandson (next door). Completely independent. Still mows her yard.       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       Hobbies: reading, work outside      Scientist, physiological Strain: Low Risk    Difficulty of Paying Living Expenses: Not hard at all  Food Insecurity: No Food Insecurity   Worried About Charity fundraiser in the Last Year: Never true   Arboriculturist in the Last Year: Never true  Transportation Needs: No Transportation Needs   Lack of Transportation (Medical): No   Lack of Transportation (Non-Medical): No  Physical Activity: Inactive   Days of Exercise per Week: 0 days   Minutes of Exercise per Session: 0 min  Stress: No Stress Concern Present  Feeling of Stress : Not at all  Social Connections: Moderately Isolated   Frequency of Communication with Friends and Family: More than three times a week   Frequency of Social Gatherings with Friends and Family: Once a week   Attends Religious Services: More than 4 times per year   Active Member of [employer] or Organizations: No   Attends Archivist Meetings: Never   Marital Status: Widowed  Human resources officer Violence: Not At Risk   Fear of Current or Ex-Partner: No   Emotionally Abused: No   Physically Abused: No   Sexually Abused: No    Outpatient Medications Prior to Visit  Medication Sig Dispense Refill   amLODipine (NORVASC) 2.5 MG tablet Take 1 tablet (2.5 mg total) by mouth daily. 90 tablet 3   hydrochlorothiazide (HYDRODIURIL) 25 MG tablet Take one tablet by mouth with Losartan. 90 tablet 3   losartan (COZAAR) 100 MG tablet Take 1 tablet (100 mg total) by mouth daily. 90 tablet 3   triamcinolone (KENALOG) 0.025 % ointment Apply 1 application topically 2 (two) times daily  as needed. 60 g 0   No facility-administered medications prior to visit.    Allergies  Allergen Reactions   Lisinopril Cough   Statins Other (See Comments)    Hair loss   Sulfa Antibiotics Other (See Comments)    No reaction-patient states her family member was "burned" from this medication    Review of Systems REFER TO HPI FOR PERTINENT POSITIVES AND NEGATIVES     Objective:    Physical Exam Constitutional:      General: She is not in acute distress.    Appearance: Normal appearance. She is not ill-appearing.  Musculoskeletal:     Right shoulder: Normal.     Right upper arm: Normal. No swelling, deformity or tenderness.     Right knee: Swelling and bony tenderness present. No ecchymosis, lacerations or crepitus. Normal range of motion. Tenderness (patella) present. No LCL laxity, MCL laxity, ACL laxity or PCL laxity. Normal meniscus. Normal pulse.     Instability Tests: Anterior drawer test negative.  Neurological:     General: No focal deficit present.     Mental Status: She is alert and oriented to person, place, and time.  Psychiatric:        Mood and Affect: Mood normal.        Behavior: Behavior normal.    BP (!) 183/71   Pulse 82   Temp 97.9 F (36.6 C)   Ht '5\' 3"'$  (1.6 m)   Wt 122 lb (55.3 kg)   SpO2 96%   BMI 21.61 kg/m  Wt Readings from Last 3 Encounters:  06/14/21 122 lb (55.3 kg)  04/22/21 122 lb 3.2 oz (55.4 kg)  10/05/20 121 lb 3.2 oz (55 kg)    Health Maintenance Due  Topic Date Due   OPHTHALMOLOGY EXAM  08/21/2020   COVID-19 Vaccine (4 - Booster for Moderna series) 01/29/2021   FOOT EXAM  04/02/2021   INFLUENZA VACCINE  05/16/2021    There are no preventive care reminders to display for this patient.   Lab Results  Component Value Date   TSH 2.98 04/02/2020   Lab Results  Component Value Date   WBC 9.8 04/22/2021   HGB 13.3 04/22/2021   HCT 39.7 04/22/2021   MCV 88.2 04/22/2021   PLT 253 04/22/2021   Lab Results  Component  Value Date   NA 143 04/22/2021   K 4.4 04/22/2021   CO2  28 04/22/2021   GLUCOSE 132 (H) 04/22/2021   BUN 31 (H) 04/22/2021   CREATININE 1.19 (H) 04/22/2021   BILITOT 0.4 04/22/2021   ALKPHOS 71 04/02/2020   AST 14 04/22/2021   ALT 14 04/22/2021   PROT 7.0 04/22/2021   ALBUMIN 4.6 04/02/2020   CALCIUM 9.7 04/22/2021   ANIONGAP 8 07/06/2015   GFR 58.68 (L) 04/02/2020   Lab Results  Component Value Date   CHOL 265 (H) 04/22/2021   Lab Results  Component Value Date   HDL 45 (L) 04/22/2021   Lab Results  Component Value Date   [facility]  04/22/2021     Comment:     . LDL cholesterol not calculated. Triglyceride levels greater than 400 mg/dL invalidate calculated LDL results. . Reference range: <100 . Desirable range <100 mg/dL for primary prevention;   <70 mg/dL for patients with CHD or diabetic patients  with > or = 2 CHD risk factors. Marland Kitchen LDL-C is now calculated using the Martin-Hopkins  calculation, which is a validated novel method providing  better accuracy than the Friedewald equation in the  estimation of LDL-C.  Cresenciano Genre et al. Annamaria Helling. [id number]): 2061-2068  (http://education.[employer].com/faq/FAQ164)    Lab Results  Component Value Date   TRIG 402 (H) 04/22/2021   Lab Results  Component Value Date   CHOLHDL 5.9 (H) 04/22/2021   Lab Results  Component Value Date   HGBA1C 6.6 (H) 04/22/2021       Assessment & Plan:   Problem List Items Addressed This Visit   None   1. Anterior knee pain, right 2. Pain in right upper arm 3. Fall, initial encounter -Fall on 06/05/2021.  Most concerned about her right knee and recommend getting an x-ray of this to rule out any acute fracture, especially patellar fracture where she is most tender.  Her upper arm is benign and I do not think imaging is warranted at this time.  She can continue to take Tylenol for pain and she needs to rest and elevate.  Ice may be better than heat.  She may continue her regular  activity as she tolerates according to her pain.   Jayren Cease M Lekeya Rollings, PA-C

## 2021-06-14 NOTE — Patient Instructions (Signed)
Good to meet you today!  We need to get an XRAY of your right knee. I will call with results. Tylenol is ok for pain, as well as rest, ice, elevation. Activity only as tolerated according to your pain level.

## 2021-06-23 ENCOUNTER — Telehealth: Payer: Self-pay

## 2021-06-23 NOTE — Telephone Encounter (Signed)
Patient called in asking for results of the x-ray done on 8/30, lisa spoke with patient and gave message. Verbalized understanding, had no questions.

## 2021-07-01 DIAGNOSIS — E1342 Other specified diabetes mellitus with diabetic polyneuropathy: Secondary | ICD-10-CM | POA: Diagnosis not present

## 2021-07-01 DIAGNOSIS — B351 Tinea unguium: Secondary | ICD-10-CM | POA: Diagnosis not present

## 2021-07-01 DIAGNOSIS — L84 Corns and callosities: Secondary | ICD-10-CM | POA: Diagnosis not present

## 2021-07-14 DIAGNOSIS — Z23 Encounter for immunization: Secondary | ICD-10-CM | POA: Diagnosis not present

## 2021-09-27 DIAGNOSIS — B351 Tinea unguium: Secondary | ICD-10-CM | POA: Diagnosis not present

## 2021-09-27 DIAGNOSIS — L84 Corns and callosities: Secondary | ICD-10-CM | POA: Diagnosis not present

## 2021-09-27 DIAGNOSIS — E1342 Other specified diabetes mellitus with diabetic polyneuropathy: Secondary | ICD-10-CM | POA: Diagnosis not present

## 2021-11-01 ENCOUNTER — Encounter: Payer: PPO | Admitting: Family Medicine

## 2021-12-13 DIAGNOSIS — L84 Corns and callosities: Secondary | ICD-10-CM | POA: Diagnosis not present

## 2021-12-13 DIAGNOSIS — E1342 Other specified diabetes mellitus with diabetic polyneuropathy: Secondary | ICD-10-CM | POA: Diagnosis not present

## 2021-12-13 DIAGNOSIS — B351 Tinea unguium: Secondary | ICD-10-CM | POA: Diagnosis not present

## 2021-12-26 ENCOUNTER — Other Ambulatory Visit: Payer: Self-pay

## 2021-12-26 ENCOUNTER — Ambulatory Visit: Payer: PPO

## 2022-01-04 ENCOUNTER — Other Ambulatory Visit: Payer: Self-pay | Admitting: Family Medicine

## 2022-02-21 DIAGNOSIS — E1342 Other specified diabetes mellitus with diabetic polyneuropathy: Secondary | ICD-10-CM | POA: Diagnosis not present

## 2022-02-21 DIAGNOSIS — B351 Tinea unguium: Secondary | ICD-10-CM | POA: Diagnosis not present

## 2022-02-21 DIAGNOSIS — L84 Corns and callosities: Secondary | ICD-10-CM | POA: Diagnosis not present

## 2022-03-29 ENCOUNTER — Telehealth: Payer: Self-pay | Admitting: Family Medicine

## 2022-03-29 NOTE — Telephone Encounter (Signed)
Copied from [place]. Topic: Medicare AWV >> Mar 29, 2022 10:53 AM Devoria Glassing wrote: Reason for CRM: Left message for patient to schedule Annual Wellness Visit.  Please schedule with Nurse Health Advisor Charlott Rakes, RN at [facility].  Please call [phone] ask for [facility]

## 2022-03-31 ENCOUNTER — Ambulatory Visit (INDEPENDENT_AMBULATORY_CARE_PROVIDER_SITE_OTHER): Payer: PPO

## 2022-03-31 DIAGNOSIS — Z0000 Encounter for general adult medical examination without abnormal findings: Secondary | ICD-10-CM | POA: Diagnosis not present

## 2022-03-31 NOTE — Patient Instructions (Signed)
Linda Golden , Thank you for taking time to come for your Medicare Wellness Visit. I appreciate your ongoing commitment to your health goals. Please review the following plan we discussed and let me know if I can assist you in the future.   Screening recommendations/referrals: Colonoscopy: no longer required  Mammogram: No longer required  Bone Density: No longer required  Recommended yearly ophthalmology/optometry visit for glaucoma screening and checkup Recommended yearly dental visit for hygiene and checkup  Vaccinations: Influenza vaccine: due  Pneumococcal vaccine: Up to date Tdap vaccine: Done 03/01/18  Shingles vaccine: Shingrix discussed. Please contact your pharmacy for coverage information.    Covid-19:Completed 2/25, 3/26, & 09/30/20  Advanced directives: Advance directive discussed with you today. Even though you declined this today please call our office should you change your mind and we can give you the proper paperwork for you to fill out.  Conditions/risks identified: None at this time   Next appointment: Follow up in one year for your annual wellness visit    Preventive Care 65 Years and Older, Female Preventive care refers to lifestyle choices and visits with your health care provider that can promote health and wellness. What does preventive care include? A yearly physical exam. This is also called an annual well check. Dental exams once or twice a year. Routine eye exams. Ask your health care provider how often you should have your eyes checked. Personal lifestyle choices, including: Daily care of your teeth and gums. Regular physical activity. Eating a healthy diet. Avoiding tobacco and drug use. Limiting alcohol use. Practicing safe sex. Taking low-dose aspirin every day. Taking vitamin and mineral supplements as recommended by your health care provider. What happens during an annual well check? The services and screenings done by your health care provider  during your annual well check will depend on your age, overall health, lifestyle risk factors, and family history of disease. Counseling  Your health care provider may ask you questions about your: Alcohol use. Tobacco use. Drug use. Emotional well-being. Home and relationship well-being. Sexual activity. Eating habits. History of falls. Memory and ability to understand (cognition). Work and work Statistician. Reproductive health. Screening  You may have the following tests or measurements: Height, weight, and BMI. Blood pressure. Lipid and cholesterol levels. These may be checked every 5 years, or more frequently if you are over 52 years old. Skin check. Lung cancer screening. You may have this screening every year starting at age 19 if you have a 30-pack-year history of smoking and currently smoke or have quit within the past 15 years. Fecal occult blood test (FOBT) of the stool. You may have this test every year starting at age 31. Flexible sigmoidoscopy or colonoscopy. You may have a sigmoidoscopy every 5 years or a colonoscopy every 10 years starting at age 71. Hepatitis C blood test. Hepatitis B blood test. Sexually transmitted disease (STD) testing. Diabetes screening. This is done by checking your blood sugar (glucose) after you have not eaten for a while (fasting). You may have this done every 1-3 years. Bone density scan. This is done to screen for osteoporosis. You may have this done starting at age 105. Mammogram. This may be done every 1-2 years. Talk to your health care provider about how often you should have regular mammograms. Talk with your health care provider about your test results, treatment options, and if necessary, the need for more tests. Vaccines  Your health care provider may recommend certain vaccines, such as: Influenza vaccine. This is  recommended every year. Tetanus, diphtheria, and acellular pertussis (Tdap, Td) vaccine. You may need a Td booster every  10 years. Zoster vaccine. You may need this after age 69. Pneumococcal 13-valent conjugate (PCV13) vaccine. One dose is recommended after age 26. Pneumococcal polysaccharide (PPSV23) vaccine. One dose is recommended after age 60. Talk to your health care provider about which screenings and vaccines you need and how often you need them. This information is not intended to replace advice given to you by your health care provider. Make sure you discuss any questions you have with your health care provider. Document Released: 10/29/2015 Document Revised: 06/21/2016 Document Reviewed: 08/03/2015 Elsevier Interactive Patient Education  2017 [place] Prevention in the Home Falls can cause injuries. They can happen to people of all ages. There are many things you can do to make your home safe and to help prevent falls. What can I do on the outside of my home? Regularly fix the edges of walkways and driveways and fix any cracks. Remove anything that might make you trip as you walk through a door, such as a raised step or threshold. Trim any bushes or trees on the path to your home. Use bright outdoor lighting. Clear any walking paths of anything that might make someone trip, such as rocks or tools. Regularly check to see if handrails are loose or broken. Make sure that both sides of any steps have handrails. Any raised decks and porches should have guardrails on the edges. Have any leaves, snow, or ice cleared regularly. Use sand or salt on walking paths during winter. Clean up any spills in your garage right away. This includes oil or grease spills. What can I do in the bathroom? Use night lights. Install grab bars by the toilet and in the tub and shower. Do not use towel bars as grab bars. Use non-skid mats or decals in the tub or shower. If you need to sit down in the shower, use a plastic, non-slip stool. Keep the floor dry. Clean up any water that spills on the floor as soon as it  happens. Remove soap buildup in the tub or shower regularly. Attach bath mats securely with double-sided non-slip rug tape. Do not have throw rugs and other things on the floor that can make you trip. What can I do in the bedroom? Use night lights. Make sure that you have a light by your bed that is easy to reach. Do not use any sheets or blankets that are too big for your bed. They should not hang down onto the floor. Have a firm chair that has side arms. You can use this for support while you get dressed. Do not have throw rugs and other things on the floor that can make you trip. What can I do in the kitchen? Clean up any spills right away. Avoid walking on wet floors. Keep items that you use a lot in easy-to-reach places. If you need to reach something above you, use a strong step stool that has a grab bar. Keep electrical cords out of the way. Do not use floor polish or wax that makes floors slippery. If you must use wax, use non-skid floor wax. Do not have throw rugs and other things on the floor that can make you trip. What can I do with my stairs? Do not leave any items on the stairs. Make sure that there are handrails on both sides of the stairs and use them. Fix handrails that are  broken or loose. Make sure that handrails are as long as the stairways. Check any carpeting to make sure that it is firmly attached to the stairs. Fix any carpet that is loose or worn. Avoid having throw rugs at the top or bottom of the stairs. If you do have throw rugs, attach them to the floor with carpet tape. Make sure that you have a light switch at the top of the stairs and the bottom of the stairs. If you do not have them, ask someone to add them for you. What else can I do to help prevent falls? Wear shoes that: Do not have high heels. Have rubber bottoms. Are comfortable and fit you well. Are closed at the toe. Do not wear sandals. If you use a stepladder: Make sure that it is fully opened.  Do not climb a closed stepladder. Make sure that both sides of the stepladder are locked into place. Ask someone to hold it for you, if possible. Clearly mark and make sure that you can see: Any grab bars or handrails. First and last steps. Where the edge of each step is. Use tools that help you move around (mobility aids) if they are needed. These include: Canes. Walkers. Scooters. Crutches. Turn on the lights when you go into a dark area. Replace any light bulbs as soon as they burn out. Set up your furniture so you have a clear path. Avoid moving your furniture around. If any of your floors are uneven, fix them. If there are any pets around you, be aware of where they are. Review your medicines with your doctor. Some medicines can make you feel dizzy. This can increase your chance of falling. Ask your doctor what other things that you can do to help prevent falls. This information is not intended to replace advice given to you by your health care provider. Make sure you discuss any questions you have with your health care provider. Document Released: 07/29/2009 Document Revised: 03/09/2016 Document Reviewed: 11/06/2014 Elsevier Interactive Patient Education  2017 [employer].

## 2022-03-31 NOTE — Progress Notes (Signed)
Virtual Visit via Telephone Note  I connected with  Linda Golden on 03/31/22 at  9:00 AM EDT by telephone and verified that I am speaking with the correct person using two identifiers.  Medicare Annual Wellness visit completed telephonically due to Covid-19 pandemic.   Persons participating in this call: This Health Coach and this patient.   Location: Patient: Home Provider: Office    I discussed the limitations, risks, security and privacy concerns of performing an evaluation and management service by telephone and the availability of in person appointments. The patient expressed understanding and agreed to proceed.  Unable to perform video visit due to video visit attempted and failed and/or patient does not have video capability.   Some vital signs may be absent or patient reported.   Linda Brace, LPN   Subjective:   Linda Golden is a 88 y.o. female who presents for Medicare Annual (Subsequent) preventive examination.  Review of Systems     Cardiac Risk Factors include: advanced age (>78mn, >>4women);diabetes mellitus;dyslipidemia;hypertension     Objective:    There were no vitals filed for this visit. There is no height or weight on file to calculate BMI.     03/31/2022    9:01 AM 12/13/2020   11:54 AM 11/06/2019   11:03 AM 05/30/2017   11:10 AM 07/03/2015   11:47 AM  Advanced Directives  Does Patient Have a Medical Advance Directive? No No Yes No No  Type of Advance Directive   Living will;Healthcare Power of Attorney    Does patient want to make changes to medical advance directive?   No - Patient declined    Copy of [place] Chart?   No - copy requested    Would patient like information on creating a medical advance directive? No - Patient declined No - Patient declined   No - patient declined information    Current Medications (verified) Outpatient Encounter Medications as of 03/31/2022  Medication Sig   amLODipine (NORVASC) 2.5  MG tablet TAKE 1 TABLET BY MOUTH ONCE DAILY.   hydrochlorothiazide (HYDRODIURIL) 25 MG tablet TAKE 1 TABLET BY MOUTH DAILY WITH LOSARTAN.   losartan (COZAAR) 100 MG tablet TAKE 1 TABLET BY MOUTH DAILY WITH HYDROCHLOROTHIAZIDE.   triamcinolone (KENALOG) 0.025 % ointment Apply 1 application topically 2 (two) times daily as needed. (Patient not taking: Reported on 03/31/2022)   No facility-administered encounter medications on file as of 03/31/2022.    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   Diabetes mellitus    Hyperlipidemia    Hypertension    Kidney mass    was told was born with this   Osteopenia    Past Surgical History:  Procedure Laterality Date   CATARACT EXTRACTION     bilateral planned-right done, left soon   foot spur     Family History  Problem Relation Age of Onset   Hypertension Father    Alzheimer's disease Mother    Social History   Socioeconomic History   Marital status: Widowed    Spouse name: Not on file   Number of children: Not on file   Years of education: Not on file   Highest education level: Not on file  Occupational History   Not on file  Tobacco Use   Smoking status: Never   Smokeless tobacco: Never  Substance  and Sexual Activity   Alcohol use: No   Drug use: No   Sexual activity: Not on file  Other Topics Concern   Not on file  Social History Narrative   Widowed 2004. 1 son. 2 adopted grandchildren, 2 greatgrandkids. Assists in the care of autistic grandchild    [place]. Son lives close as well as grandson (next door). Completely independent. Still mows her yard.       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       Hobbies: reading, work outside      Scientist, physiological Strain: [place]  (03/31/2022)   Overall Financial Resource Strain (CARDIA)    Difficulty of  Paying Living Expenses: Not hard at all  Food Insecurity: No Food Insecurity (03/31/2022)   Hunger Vital Sign    Worried About Running Out of Food in the Last Year: Never true    Sunset in the Last Year: Never true  Transportation Needs: No Transportation Needs (03/31/2022)   PRAPARE - Hydrologist (Medical): No    Lack of Transportation (Non-Medical): No  Physical Activity: Inactive (03/31/2022)   Exercise Vital Sign    Days of Exercise per Week: 0 days    Minutes of Exercise per Session: 0 min  Stress: No Stress Concern Present (03/31/2022)   [place]    Feeling of Stress : Only a little  Social Connections: Moderately Isolated (03/31/2022)   Social Connection and Isolation Panel [NHANES]    Frequency of Communication with Friends and Family: More than three times a week    Frequency of Social Gatherings with Friends and Family: Once a week    Attends Religious Services: More than 4 times per year    Active Member of [employer] or Organizations: No    Attends Archivist Meetings: Never    Marital Status: Widowed    Tobacco Counseling Counseling given: Not Answered   Clinical Intake:  Pre-visit preparation completed: Yes  Pain : No/denies pain     BMI - recorded: 21.62 Nutritional Status: BMI of 19-24  Normal Nutritional Risks: None Diabetes: Yes CBG done?: No Did pt. bring in CBG monitor from home?: No  How often do you need to have someone help you when you read instructions, pamphlets, or other written materials from your doctor or pharmacy?: 1 - Never  Diabetic?Nutrition Risk Assessment:  Has the patient had any N/V/D within the last 2 months?  No  Does the patient have any non-healing wounds?  No  Has the patient had any unintentional weight loss or weight gain?  No   Diabetes:  Is the patient diabetic?  Yes  If diabetic, was a CBG obtained today?  No   Did the patient bring in their glucometer from home?  No  How often do you monitor your CBG's? N/A.   Financial Strains and Diabetes Management:  Are you having any financial strains with the device, your supplies or your medication? No .  Does the patient want to be seen by Chronic Care Management for management of their diabetes?  No  Would the patient like to be referred to a Nutritionist or for Diabetic Management?  No   Diabetic Exams:  Diabetic Eye Exam: Overdue for diabetic eye exam. Pt has been advised about the importance in completing this exam. Patient advised to call and schedule an  eye exam. Diabetic Foot Exam: Overdue, Pt has been advised about the importance in completing this exam. Pt is scheduled for diabetic foot exam on next .  Interpreter Needed?: No  Information entered by :: Linda Rakes, LPN   Activities of Daily Living    03/31/2022    9:03 AM  In your present state of health, do you have any difficulty performing the following activities:  Hearing? 1  Comment wears a hearing aids  Vision? 0  Difficulty concentrating or making decisions? 0  Walking or climbing stairs? 1  Dressing or bathing? 0  Doing errands, shopping? 0  Preparing Food and eating ? N  Using the Toilet? N  In the past six months, have you accidently leaked urine? N  Do you have problems with loss of bowel control? N  Managing your Medications? N  Managing your Finances? N  Housekeeping or managing your Housekeeping? N    Patient Care Team: Marin Olp, MD as PCP - General (Family Medicine) Edythe Clarity, [facility] (Pharmacist)  Indicate any recent Medical Services you may have received from other than [facility] providers in the past year (date may be approximate).     Assessment:   This is a routine wellness examination for Linda Golden.  Hearing/Vision screen Hearing Screening - Comments:: Wears a hearing aid at times  Vision Screening - Comments:: Pt follows up with Dr Venetia Constable  for annual eye exams   Dietary issues and exercise activities discussed: Current Exercise Habits: The patient does not participate in regular exercise at present   Goals Addressed             This Visit's Progress    Patient Stated       None at this time        Depression Screen    03/31/2022    9:00 AM 04/22/2021    2:48 PM 12/13/2020   11:53 AM 11/06/2019   11:04 AM 07/17/2019    2:25 PM 12/20/2018   10:11 AM 05/30/2017   11:12 AM  PHQ 2/9 Scores  PHQ - 2 Score 0 0 0 0 0 0 0    Fall Risk    03/31/2022    9:03 AM 06/14/2021   10:31 AM 04/22/2021    2:48 PM 12/13/2020   11:56 AM 04/02/2020   11:29 AM  Fall Risk   Falls in the past year? 1 1 0 0 0  Number falls in past yr: 1 0 0 0 0  Injury with Fall? 1 1 0 0 0  Comment right arm and knee Pain in knee and arm last Sunday had a fall in the kitchen     Risk for fall due to : Impaired vision  No Fall Risks Impaired vision;Impaired balance/gait;Impaired mobility   Follow up Falls prevention discussed  Falls evaluation completed Falls prevention discussed     FALL RISK PREVENTION PERTAINING TO THE HOME:  Any stairs in or around the home? Yes  If so, are there any without handrails? No  Home free of loose throw rugs in walkways, pet beds, electrical cords, etc? Yes  Adequate lighting in your home to reduce risk of falls? Yes   ASSISTIVE DEVICES UTILIZED TO PREVENT FALLS:  Life alert? No  Use of a cane, walker or w/c? No  Grab bars in the bathroom? Yes  Shower chair or bench in shower? Yes  Elevated toilet seat or a handicapped toilet? No   TIMED UP AND GO:  Was the test  performed? No .  Cognitive Function:        03/31/2022    9:08 AM 11/06/2019   11:05 AM 05/30/2017   11:13 AM  6CIT Screen  What Year? 0 points 0 points 0 points  What month? 0 points 0 points 0 points  What time? 0 points 0 points 0 points  Count back from 20 0 points 0 points 0 points  Months in reverse 0 points 0 points 0 points  Repeat  phrase 0 points 0 points 0 points  Total Score 0 points 0 points 0 points    Immunizations Immunization History  Administered Date(s) Administered   Fluad Quad(high Dose 65+) 08/24/2020   Influenza Split 07/27/2011, 07/08/2012   Influenza Whole 07/16/2006, 08/22/2007, 07/01/2008, 07/30/2009, 06/29/2010   Influenza, High Dose Seasonal PF 08/18/2016, 08/03/2017, 08/19/2018   Influenza,inj,Quad PF,6+ Mos 08/01/2013, 08/05/2014, 07/13/2015   Moderna Sars-Covid-2 Vaccination 12/11/2019, 01/09/2020, 09/30/2020   Pneumococcal Conjugate-13 05/24/2015   Pneumococcal Polysaccharide-23 02/14/1999   Td 10/16/2005   Tdap 03/01/2018    TDAP status: Up to date  Flu Vaccine status: Due, Education has been provided regarding the importance of this vaccine. Advised may receive this vaccine at local pharmacy or Health Dept. Aware to provide a copy of the vaccination record if obtained from local pharmacy or Health Dept. Verbalized acceptance and understanding.  Pneumococcal vaccine status: Up to date  Covid-19 vaccine status: Completed vaccines  Qualifies for Shingles Vaccine? Yes   Zostavax completed No   Shingrix Completed?: No.    Education has been provided regarding the importance of this vaccine. Patient has been advised to call insurance company to determine out of pocket expense if they have not yet received this vaccine. Advised may also receive vaccine at local pharmacy or Health Dept. Verbalized acceptance and understanding.  Screening Tests Health Maintenance  Topic Date Due   Zoster Vaccines- Shingrix (1 of 2) Never done   OPHTHALMOLOGY EXAM  08/21/2020   COVID-19 Vaccine (4 - Moderna series) 11/25/2020   FOOT EXAM  04/02/2021   HEMOGLOBIN A1C  10/23/2021   INFLUENZA VACCINE  05/16/2022   TETANUS/TDAP  03/01/2028   Pneumonia Vaccine 68+ Years old  Completed   DEXA SCAN  Addressed   HPV VACCINES  Aged Out    Health Maintenance  Health Maintenance Due  Topic Date Due    Zoster Vaccines- Shingrix (1 of 2) Never done   OPHTHALMOLOGY EXAM  08/21/2020   COVID-19 Vaccine (4 - Moderna series) 11/25/2020   FOOT EXAM  04/02/2021   HEMOGLOBIN A1C  10/23/2021    Colorectal cancer screening: No longer required.   Mammogram status: No longer required due to age.  Bone Density status: Completed 04/17/07. Results reflect: Bone density results: OSTEOPENIA. Repeat every 0 years.   Additional Screening:   Vision Screening: Recommended annual ophthalmology exams for early detection of glaucoma and other disorders of the eye. Is the patient up to date with their annual eye exam?  Yes  Who is the provider or what is the name of the office in which the patient attends annual eye exams? Dr Venetia Constable  If pt is not established with a provider, would they like to be referred to a provider to establish care? No .   Dental Screening: Recommended annual dental exams for proper oral hygiene  Community Resource Referral / Chronic Care Management: CRR required this visit?  No   CCM required this visit?  No      Plan:  I have personally reviewed and noted the following in the patient's chart:   Medical and social history Use of alcohol, tobacco or illicit drugs  Current medications and supplements including opioid prescriptions.  Functional ability and status Nutritional status Physical activity Advanced directives List of other physicians Hospitalizations, surgeries, and ER visits in previous 12 months Vitals Screenings to include cognitive, depression, and falls Referrals and appointments  In addition, I have reviewed and discussed with patient certain preventive protocols, quality metrics, and best practice recommendations. A written personalized care plan for preventive services as well as general preventive health recommendations were provided to patient.     Linda Brace, LPN   [id number]   Nurse Notes: None

## 2022-04-10 ENCOUNTER — Other Ambulatory Visit: Payer: Self-pay | Admitting: Family Medicine

## 2022-05-02 DIAGNOSIS — B351 Tinea unguium: Secondary | ICD-10-CM | POA: Diagnosis not present

## 2022-05-02 DIAGNOSIS — L84 Corns and callosities: Secondary | ICD-10-CM | POA: Diagnosis not present

## 2022-05-02 DIAGNOSIS — E1142 Type 2 diabetes mellitus with diabetic polyneuropathy: Secondary | ICD-10-CM | POA: Diagnosis not present

## 2022-05-19 ENCOUNTER — Encounter: Payer: Self-pay | Admitting: Family Medicine

## 2022-05-19 ENCOUNTER — Ambulatory Visit (INDEPENDENT_AMBULATORY_CARE_PROVIDER_SITE_OTHER): Payer: PPO | Admitting: Family Medicine

## 2022-05-19 VITALS — BP 138/58 | HR 75 | Temp 98.0°F | Ht 63.0 in | Wt 118.2 lb

## 2022-05-19 DIAGNOSIS — E1169 Type 2 diabetes mellitus with other specified complication: Secondary | ICD-10-CM | POA: Diagnosis not present

## 2022-05-19 DIAGNOSIS — Z0000 Encounter for general adult medical examination without abnormal findings: Secondary | ICD-10-CM

## 2022-05-19 DIAGNOSIS — N183 Chronic kidney disease, stage 3 unspecified: Secondary | ICD-10-CM

## 2022-05-19 DIAGNOSIS — E785 Hyperlipidemia, unspecified: Secondary | ICD-10-CM

## 2022-05-19 DIAGNOSIS — E1122 Type 2 diabetes mellitus with diabetic chronic kidney disease: Secondary | ICD-10-CM

## 2022-05-19 DIAGNOSIS — I10 Essential (primary) hypertension: Secondary | ICD-10-CM

## 2022-05-19 MED ORDER — HYDROCHLOROTHIAZIDE 25 MG PO TABS: 25.0000 mg | ORAL_TABLET | Freq: Every day | ORAL | 3 refills | Status: DC

## 2022-05-19 MED ORDER — LOSARTAN POTASSIUM 100 MG PO TABS
ORAL_TABLET | ORAL | 3 refills | Status: DC
Start: 2022-05-19 — End: 2023-07-27

## 2022-05-19 MED ORDER — AMLODIPINE BESYLATE 2.5 MG PO TABS
2.5000 mg | ORAL_TABLET | Freq: Every day | ORAL | 3 refills | Status: DC
Start: 2022-05-19 — End: 2023-07-27

## 2022-05-19 NOTE — Patient Instructions (Addendum)
Flu shot- we should have these available within a month or two but please let us know if you get at outside pharmacy- consider covid shot in October (they should be releasing a new one)  If you change your mind-  Please check with your pharmacy to see if they have the shingrix vaccine. If they do- please get this immunization and update [place] by phone call or mychart with dates you receive the vaccine  Please stop by lab before you go If you have mychart- we will send your results within 3 business days of [place] receiving them.  If you do not have mychart- we will call you about results within 5 business days of [place] receiving them.  *please also note that you will see labs on mychart as soon as they post. I will later go in and write notes on them- will say "notes from Dr. Yong Channel"   Recommended follow up: Return in about 6 months (around 11/19/2022) for followup or sooner if needed.Schedule b4 you leave. -cancel visit June 2024 and reschedule physical a year and a day from today

## 2022-05-19 NOTE — Progress Notes (Signed)
Phone [phone]   Subjective:  Patient presents today for their annual physical. Chief complaint-noted.   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   Diabetes mellitus    Hyperlipidemia    Hypertension    Kidney mass    was told was born with this   Osteopenia    Patient Active Problem List   Diagnosis Date Noted   Controlled type 2 diabetes mellitus with renal manifestation ([place]) 04/18/2007    Priority: High   Diverticulitis of intestine with abscess 07/03/2015    Priority: Medium    CKD (chronic kidney disease), stage III ([place]) 10/23/2014    Priority: Medium    Hyperlipidemia associated with type 2 diabetes mellitus ([place]) 04/18/2007    Priority: Medium    Essential hypertension 04/18/2007    Priority: Medium    Raynaud's disease 10/08/2013    Priority: Low   Osteoarthritis 07/01/2008    Priority: Low   Varicose veins 11/04/2007    Priority: Low   Osteopenia 04/18/2007    Priority: Low   Past Surgical History:  Procedure Laterality Date   CATARACT EXTRACTION     bilateral planned-right done, left soon   foot spur      Family History  Problem Relation Age of Onset   Hypertension Father    Alzheimer's disease Mother     Medications- reviewed and updated Current Outpatient Medications  Medication Sig Dispense Refill   amLODipine (NORVASC) 2.5 MG tablet Take 1 tablet (2.5 mg total) by mouth daily. 90 tablet 3   hydrochlorothiazide (HYDRODIURIL) 25 MG tablet Take 1 tablet (25 mg total) by mouth daily. 90 tablet 3   losartan (COZAAR) 100 MG tablet TAKE 1 TABLET BY MOUTH DAILY WITH HYDROCHLOROTHIAZIDE.  90 tablet 3   No current facility-administered medications for this visit.    Allergies-reviewed and updated Allergies  Allergen Reactions   Lisinopril Cough   Statins Other (See Comments)    Hair loss   Sulfa Antibiotics Other (See Comments)    No reaction-patient states her family member was "burned" from this medication    Social History   Social History Narrative   Widowed 2004. 1 son. 2 adopted grandchildren, 2 greatgrandkids. Assists in the care of autistic grandchild    [place] farm. Son lives close as well as grandson (next door). Completely independent. Still mows her yard.       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       Hobbies: reading, work outside      Objective  Objective:  BP (!) 138/58 Comment: most recent home reading from yesterday  Pulse 75   Temp 98 F (36.7 C)   Ht '5\' 3"'$  (1.6 m)   Wt 118 lb 3.2 oz (53.6 kg)   SpO2 97%   BMI 20.94 kg/m  Gen: NAD, resting comfortably HEENT: Mucous membranes are moist. Oropharynx normal Neck: no thyromegaly CV: RRR no murmurs rubs or gallops Lungs: CTAB no crackles, wheeze, rhonchi Abdomen: soft/nontender/nondistended/normal bowel sounds. No rebound or guarding.  Ext: trace edema Skin: warm, dry Neuro: grossly normal, moves all extremities, PERRLA  Diabetic Foot Exam - Simple   Simple Foot Form Diabetic Foot exam was performed with the following findings: Yes 05/19/2022  3:11 PM  Visual Inspection See comments: Yes Sensation Testing See comments: Yes Pulse Check Posterior Tibialis and Dorsalis pulse intact bilaterally: Yes Comments States feels monofilament but not as strong as in the past, has a callous under head of 5th metatarsal- gets evaluated by podiatry every 2 months       Assessment and Plan   88 y.o. female presenting for annual physical.  Health Maintenance counseling: 1. Anticipatory guidance: Patient counseled regarding regular dental exams -q6 months, eye exams - yearly,   avoiding smoking and second hand smoke , limiting alcohol to 1 beverage per day- doesn't drink , no illicit drugs .   2. Risk factor reduction:  Advised patient of need for regular exercise and diet rich and fruits and vegetables to reduce risk of heart attack and stroke.  Exercise- remains active around her home- in fact more active after son with stroke Diet/weight management-tries eat reasonably healthy but does admit to overdoing [employer] still  Wt Readings from Last 3 Encounters:  05/19/22 118 lb 3.2 oz (53.6 kg)  06/14/21 122 lb (55.3 kg)  04/22/21 122 lb 3.2 oz (55.4 kg)  3. Immunizations/screenings/ancillary studies-declines Shingrix for now, recommended flu shot and COVID-19 updated vaccination likely after October when available vaccine updated Immunization History  Administered Date(s) Administered   Fluad Quad(high Dose 65+) 08/24/2020   Influenza Split 07/27/2011, 07/08/2012   Influenza Whole 07/16/2006, 08/22/2007, 07/01/2008, 07/30/2009, 06/29/2010   Influenza, High Dose Seasonal PF 08/18/2016, 08/03/2017, 08/19/2018   Influenza,inj,Quad PF,6+ Mos 08/01/2013, 08/05/2014, 07/13/2015   Moderna Sars-Covid-2 Vaccination 12/11/2019, 01/09/2020, 09/30/2020   Pneumococcal Conjugate-13 05/24/2015   Pneumococcal Polysaccharide-23 02/14/1999   Td 10/16/2005   Tdap 03/01/2018  4. Cervical cancer screening- past age based screening recommendations 5. Breast cancer screening-past age based screening recommendations-denies any palpable concerns  6. Colon cancer screening -past age based screening recommendations, no blood in stool or melena   7. Skin cancer screening-continues to follow with Dr. [place] Crane. advised regular sunscreen use. Denies worrisome, changing, or new skin lesions.  8. Birth control/STD check- postmenopausal/not sexually active 9. Osteoporosis screening at 65- last bone density 2008-offered repeat but she declines 10. Smoking associated screening -never smoker  Status of  chronic or acute concerns   #social update- son with severe stroke- now blind - he was  providing care for her- but now she has to care for him and drive. Had already lost daughter in law in recent years. Granddaughter autistic- needs significant help  #Diabetes S: Diet controlled.    Lab Results  Component Value Date   HGBA1C 6.6 (H) 04/22/2021   HGBA1C 6.8 (H) 10/05/2020   HGBA1C 6.1 (A) 04/02/2020  A/P: Has been well controlled-update A1c with labs today   #Hypertension/CKD stage III S: Compliant with amlodipine 2.5 mg,losartan hydrochlorothiazide 100-25 mg (right now pill is being split).  GFR is have been largely stable in the 50s.  Knows to avoid NSAIDs.  On ACE-i case proteinuric element.  - highest she sees at home on wrist cuff is 140 BP Readings from Last 3 Encounters:  05/19/22 (!) 138/58  06/14/21 (!) 183/71  04/22/21 138/70   A/P: Hypertension-reasonable control at home- continue current meds CKD stage III-hopefully stable-update CMP with labs today  #hyperlipidemia S: Medication: none  . had hair loss in the past on statin.  - some coronary artery calcifications on 2016 CT-does  not want to try Zetia either Lab Results  Component Value Date   CHOL 265 (H) 04/22/2021   HDL 45 (L) 04/22/2021   [place]  04/22/2021     Comment:     . LDL cholesterol not calculated. Triglyceride levels greater than 400 mg/dL invalidate calculated LDL results. . Reference range: <100 . Desirable range <100 mg/dL for primary prevention;   <70 mg/dL for patients with CHD or diabetic patients  with > or = 2 CHD risk factors. Marland Kitchen LDL-C is now calculated using the Martin-Hopkins  calculation, which is a validated novel method providing  better accuracy than the Friedewald equation in the  estimation of LDL-C.  Cresenciano Genre et al. Annamaria Helling. 4680;321(22): 2061-2068  (http://education.[employer].com/faq/FAQ164)    LDLDIRECT 172 (H) 04/22/2021   TRIG 402 (H) 04/22/2021   CHOLHDL 5.9 (H)  04/22/2021    A/P:  Cholesterol significantly elevated-discussed again trying Zetia but she prefers not to-had hair loss on statin -opts out  Recommended follow up: No follow-ups on file. Future Appointments  Date Time Provider [place]  04/09/2023  9:00 AM Marin Olp, MD [facility]   Lab/Order associations:NOT fasting   ICD-10-CM   1. Preventative health care  Z00.00     2. Controlled type 2 diabetes mellitus with stage 3 chronic kidney disease, without long-term current use of insulin (HCC)  E11.22 CBC with Differential/Platelet   N18.30 Comprehensive metabolic panel    Lipid panel    Hemoglobin A1c    3. Hyperlipidemia associated with type 2 diabetes mellitus ([place])  E11.69    E78.5     4. Essential hypertension  I10     5. Stage 3 chronic kidney disease, unspecified whether stage 3a or 3b CKD (HCC)  N18.30       Meds ordered this encounter  Medications   amLODipine (NORVASC) 2.5 MG tablet    Sig: Take 1 tablet (2.5 mg total) by mouth daily.    Dispense:  90 tablet    Refill:  3   hydrochlorothiazide (HYDRODIURIL) 25 MG tablet    Sig: Take 1 tablet (25 mg total) by mouth daily.    Dispense:  90 tablet    Refill:  3   losartan (COZAAR) 100 MG tablet    Sig: TAKE 1 TABLET BY MOUTH DAILY WITH HYDROCHLOROTHIAZIDE.    Dispense:  90 tablet    Refill:  3    Return precautions advised.  [name], MD

## 2022-05-20 LAB — CBC WITH DIFFERENTIAL/PLATELET
Absolute Monocytes: 784 cells/uL (ref 200–950)
Basophils Absolute: 49 cells/uL (ref 0–200)
Basophils Relative: 0.5 %
Eosinophils Absolute: 157 cells/uL (ref 15–500)
Eosinophils Relative: 1.6 %
HCT: 40.3 % (ref 35.0–45.0)
Hemoglobin: 13.3 g/dL (ref 11.7–15.5)
Lymphs Abs: 3606 cells/uL (ref 850–3900)
MCH: 29.4 pg (ref 27.0–33.0)
MCHC: 33 g/dL (ref 32.0–36.0)
MCV: 89 fL (ref 80.0–100.0)
MPV: 11.1 fL (ref 7.5–12.5)
Monocytes Relative: 8 %
Neutro Abs: 5204 cells/uL (ref 1500–7800)
Neutrophils Relative %: 53.1 %
Platelets: 212 10*3/uL (ref 140–400)
RBC: 4.53 10*6/uL (ref 3.80–5.10)
RDW: 12.8 % (ref 11.0–15.0)
Total Lymphocyte: 36.8 %
WBC: 9.8 10*3/uL (ref 3.8–10.8)

## 2022-05-20 LAB — COMPREHENSIVE METABOLIC PANEL
AG Ratio: 1.6 (calc) (ref 1.0–2.5)
ALT: 11 U/L (ref 6–29)
AST: 17 U/L (ref 10–35)
Albumin: 4.4 g/dL (ref 3.6–5.1)
Alkaline phosphatase (APISO): 73 U/L (ref 37–153)
BUN/Creatinine Ratio: 23 (calc) — ABNORMAL HIGH (ref 6–22)
BUN: 26 mg/dL — ABNORMAL HIGH (ref 7–25)
CO2: 24 mmol/L (ref 20–32)
Calcium: 9.6 mg/dL (ref 8.6–10.4)
Chloride: 106 mmol/L (ref 98–110)
Creat: 1.15 mg/dL — ABNORMAL HIGH (ref 0.60–0.95)
Globulin: 2.8 g/dL (calc) (ref 1.9–3.7)
Glucose, Bld: 130 mg/dL — ABNORMAL HIGH (ref 65–99)
Potassium: 4.7 mmol/L (ref 3.5–5.3)
Sodium: 143 mmol/L (ref 135–146)
Total Bilirubin: 0.3 mg/dL (ref 0.2–1.2)
Total Protein: 7.2 g/dL (ref 6.1–8.1)

## 2022-05-20 LAB — LIPID PANEL
Cholesterol: 257 mg/dL — ABNORMAL HIGH (ref <200)
HDL: 42 mg/dL — ABNORMAL LOW (ref >=50)
LDL Cholesterol (Calc): 158 mg/dL (calc) — ABNORMAL HIGH
Non-HDL Cholesterol (Calc): 215 mg/dL (calc) — ABNORMAL HIGH (ref <130)
Total CHOL/HDL Ratio: 6.1 (calc) — ABNORMAL HIGH (ref <5.0)
Triglycerides: 373 mg/dL — ABNORMAL HIGH (ref <150)

## 2022-05-20 LAB — HEMOGLOBIN A1C
Hgb A1c MFr Bld: 6.4 % of total Hgb — ABNORMAL HIGH (ref <5.7)
Mean Plasma Glucose: 137 mg/dL
eAG (mmol/L): 7.6 mmol/L

## 2022-05-23 ENCOUNTER — Other Ambulatory Visit: Payer: Self-pay | Admitting: *Deleted

## 2022-05-23 NOTE — Patient Outreach (Signed)
  Care Coordination   05/23/2022 Name: Linda Golden MRN: [id number] DOB: 08/07/1927   Care Coordination Outreach Attempts:  An unsuccessful telephone outreach was attempted today to offer the patient information about available care coordination services as a benefit of their health plan.   Follow Up Plan:  Additional outreach attempts will be made to offer the patient care coordination information and services.   Encounter Outcome:  No Answer  Care Coordination Interventions Activated:  No  No, not indicated Care Coordination Interventions:  No, not indicated    Raina Mina, RN Care Management Coordinator [place] Office [phone]

## 2022-05-29 DIAGNOSIS — X32XXXD Exposure to sunlight, subsequent encounter: Secondary | ICD-10-CM | POA: Diagnosis not present

## 2022-05-29 DIAGNOSIS — C44629 Squamous cell carcinoma of skin of left upper limb, including shoulder: Secondary | ICD-10-CM | POA: Diagnosis not present

## 2022-05-29 DIAGNOSIS — Z08 Encounter for follow-up examination after completed treatment for malignant neoplasm: Secondary | ICD-10-CM | POA: Diagnosis not present

## 2022-05-29 DIAGNOSIS — L570 Actinic keratosis: Secondary | ICD-10-CM | POA: Diagnosis not present

## 2022-05-29 DIAGNOSIS — Z85828 Personal history of other malignant neoplasm of skin: Secondary | ICD-10-CM | POA: Diagnosis not present

## 2022-07-10 DIAGNOSIS — Z08 Encounter for follow-up examination after completed treatment for malignant neoplasm: Secondary | ICD-10-CM | POA: Diagnosis not present

## 2022-07-10 DIAGNOSIS — L570 Actinic keratosis: Secondary | ICD-10-CM | POA: Diagnosis not present

## 2022-07-10 DIAGNOSIS — Z85828 Personal history of other malignant neoplasm of skin: Secondary | ICD-10-CM | POA: Diagnosis not present

## 2022-07-10 DIAGNOSIS — X32XXXD Exposure to sunlight, subsequent encounter: Secondary | ICD-10-CM | POA: Diagnosis not present

## 2022-07-10 DIAGNOSIS — C44319 Basal cell carcinoma of skin of other parts of face: Secondary | ICD-10-CM | POA: Diagnosis not present

## 2022-07-20 ENCOUNTER — Telehealth: Payer: Self-pay | Admitting: *Deleted

## 2022-07-20 NOTE — Patient Outreach (Signed)
  Care Coordination   07/20/2022 Name: Linda Golden MRN: [id number] DOB: Mar 01, 1927   Care Coordination Outreach Attempts:  A second unsuccessful outreach was attempted today to offer the patient with information about available care coordination services as a benefit of their health plan.     Follow Up Plan:  Additional outreach attempts will be made to offer the patient care coordination information and services.   Encounter Outcome:  No Answer  Care Coordination Interventions Activated:  No   Care Coordination Interventions:  No, not indicated     Raina Mina, RN Care Management Coordinator [place] Office [phone]

## 2022-07-26 ENCOUNTER — Telehealth: Payer: Self-pay | Admitting: *Deleted

## 2022-07-26 NOTE — Patient Outreach (Signed)
  Care Coordination   07/26/2022 Name: Linda Golden MRN: [id number] DOB: 1927/08/26   Care Coordination Outreach Attempts:  A third unsuccessful outreach was attempted today to offer the patient with information about available care coordination services as a benefit of their health plan.   Follow Up Plan:  No further outreach attempts will be made at this time. We have been unable to contact the patient to offer or enroll patient in care coordination services  Encounter Outcome:  No Answer  Care Coordination Interventions Activated:  No   Care Coordination Interventions:  No, not indicated    Raina Mina, RN Care Management Coordinator [place] Office [phone]

## 2022-08-03 DIAGNOSIS — C44319 Basal cell carcinoma of skin of other parts of face: Secondary | ICD-10-CM | POA: Diagnosis not present

## 2022-08-15 DIAGNOSIS — E1142 Type 2 diabetes mellitus with diabetic polyneuropathy: Secondary | ICD-10-CM | POA: Diagnosis not present

## 2022-08-15 DIAGNOSIS — L851 Acquired keratosis [keratoderma] palmaris et plantaris: Secondary | ICD-10-CM | POA: Diagnosis not present

## 2022-08-15 DIAGNOSIS — B351 Tinea unguium: Secondary | ICD-10-CM | POA: Diagnosis not present

## 2022-09-04 DIAGNOSIS — C44319 Basal cell carcinoma of skin of other parts of face: Secondary | ICD-10-CM | POA: Diagnosis not present

## 2022-10-26 DIAGNOSIS — E1142 Type 2 diabetes mellitus with diabetic polyneuropathy: Secondary | ICD-10-CM | POA: Diagnosis not present

## 2022-10-26 DIAGNOSIS — L84 Corns and callosities: Secondary | ICD-10-CM | POA: Diagnosis not present

## 2022-10-26 DIAGNOSIS — B351 Tinea unguium: Secondary | ICD-10-CM | POA: Diagnosis not present

## 2022-11-21 ENCOUNTER — Ambulatory Visit (INDEPENDENT_AMBULATORY_CARE_PROVIDER_SITE_OTHER): Payer: PPO | Admitting: Family Medicine

## 2022-11-21 ENCOUNTER — Encounter: Payer: Self-pay | Admitting: Family Medicine

## 2022-11-21 VITALS — BP 132/76 | HR 96 | Temp 97.8°F | Ht 63.0 in | Wt 118.2 lb

## 2022-11-21 DIAGNOSIS — E785 Hyperlipidemia, unspecified: Secondary | ICD-10-CM | POA: Diagnosis not present

## 2022-11-21 DIAGNOSIS — E1169 Type 2 diabetes mellitus with other specified complication: Secondary | ICD-10-CM | POA: Diagnosis not present

## 2022-11-21 DIAGNOSIS — E1122 Type 2 diabetes mellitus with diabetic chronic kidney disease: Secondary | ICD-10-CM | POA: Diagnosis not present

## 2022-11-21 DIAGNOSIS — Z23 Encounter for immunization: Secondary | ICD-10-CM

## 2022-11-21 DIAGNOSIS — N183 Chronic kidney disease, stage 3 unspecified: Secondary | ICD-10-CM

## 2022-11-21 DIAGNOSIS — I10 Essential (primary) hypertension: Secondary | ICD-10-CM | POA: Diagnosis not present

## 2022-11-21 LAB — COMPREHENSIVE METABOLIC PANEL
ALT: 12 U/L (ref 0–35)
AST: 15 U/L (ref 0–37)
Albumin: 4.7 g/dL (ref 3.5–5.2)
Alkaline Phosphatase: 69 U/L (ref 39–117)
BUN: 28 mg/dL — ABNORMAL HIGH (ref 6–23)
CO2: 26 mEq/L (ref 19–32)
Calcium: 9.7 mg/dL (ref 8.4–10.5)
Chloride: 102 mEq/L (ref 96–112)
Creatinine, Ser: 1.04 mg/dL (ref 0.40–1.20)
GFR: 46.28 mL/min — ABNORMAL LOW (ref >60.00)
Glucose, Bld: 122 mg/dL — ABNORMAL HIGH (ref 70–99)
Potassium: 3.7 mEq/L (ref 3.5–5.1)
Sodium: 141 mEq/L (ref 135–145)
Total Bilirubin: 0.5 mg/dL (ref 0.2–1.2)
Total Protein: 7.5 g/dL (ref 6.0–8.3)

## 2022-11-21 LAB — HEMOGLOBIN A1C: Hgb A1c MFr Bld: 6.9 % — ABNORMAL HIGH (ref 4.6–6.5)

## 2022-11-21 NOTE — Progress Notes (Signed)
Phone [phone] In person visit   Subjective:   Linda Golden is a 89 y.o. year old very pleasant female patient who presents for/with See problem oriented charting Chief Complaint  Patient presents with   Follow-up    Past Medical History-  Patient Active Problem List   Diagnosis Date Noted   Controlled type 2 diabetes mellitus with renal manifestation ([place]) 04/18/2007    Priority: High   Diverticulitis of intestine with abscess 07/03/2015    Priority: Medium    CKD (chronic kidney disease), stage III ([place]) 10/23/2014    Priority: Medium    Hyperlipidemia associated with type 2 diabetes mellitus ([place]) 04/18/2007    Priority: Medium    Essential hypertension 04/18/2007    Priority: Medium    Raynaud's disease 10/08/2013    Priority: Low   Osteoarthritis 07/01/2008    Priority: Low   Varicose veins 11/04/2007    Priority: Low   Osteopenia 04/18/2007    Priority: Low    Medications- reviewed and updated Current Outpatient Medications  Medication Sig Dispense Refill   amLODipine (NORVASC) 2.5 MG tablet Take 1 tablet (2.5 mg total) by mouth daily. 90 tablet 3   hydrochlorothiazide (HYDRODIURIL) 25 MG tablet Take 1 tablet (25 mg total) by mouth daily. 90 tablet 3   losartan (COZAAR) 100 MG tablet TAKE 1 TABLET BY MOUTH DAILY WITH HYDROCHLOROTHIAZIDE. 90 tablet 3   No current facility-administered medications for this visit.     Objective:  BP 132/76 Comment: most recnet home reading  Pulse 96   Temp 97.8 F (36.6 C)   Ht '5\' 3"'$  (1.6 m)   Wt 118 lb 3.2 oz (53.6 kg)   SpO2 94%   BMI 20.94 kg/m  Gen: NAD, resting comfortably CV: RRR no murmurs rubs or gallops, 2+ DP and PT pulses Lungs: CTAB no crackles, wheeze, rhonchi Ext: no edema Skin: warm, dry     Assessment and Plan   #Diabetes S: Diet controlled in past but states really not watching her diet at present- enjoying sugar Lab Results  Component Value Date   HGBA1C 6.4 (H) 05/19/2022   HGBA1C 6.6  (H) 04/22/2021   HGBA1C 6.8 (H) 10/05/2020  A/P: update a1c today- hopefully controlled but may need dietary changes if a1c above 7- otherwise focus on quality of life    #Hypertension/CKD stage III S: Compliant with amlodipine 2.5 mg,losartan hydrochlorothiazide 100-25 mg (right now pill is being split).  GFR is have been largely stable in the 50s.  Knows to avoid NSAIDs.  On ACE-i case proteinuric element.  -home Bps 130s/50s-70s BP Readings from Last 3 Encounters:  11/21/22 132/76  05/19/22 (!) 138/58  06/14/21 (!) 183/71  A/P: home blood pressure has been controlled- continue current medications  CKD III stable in past with GFR in 50s- update today   #hyperlipidemia S: Medication: none  . had hair loss in the past on statin.  - some coronary artery calcifications on 2016 CT-does not want to try Zetia either Lab Results  Component Value Date   CHOL 257 (H) 05/19/2022   HDL 42 (L) 05/19/2022   LDLCALC 158 (H) 05/19/2022   LDLDIRECT 172 (H) 04/22/2021   TRIG 373 (H) 05/19/2022   CHOLHDL 6.1 (H) 05/19/2022   A/P:  Poor control noted but with prior side effects declines statin or zetia  #Occasional calf pain going up driveway with incline. Good pulses on exam. Still offered ABIs with coronary calcium but she declines  # Osteopenia listed but  no actual bone density on file-offered to schedule but patient declines- she would not be interested in meds at this time   Recommended follow up: Return in about 6 months (around 05/22/2023) for physical or sooner if needed.Schedule b4 you leave.  Future Appointments  Date Time Provider [place]  04/10/2023 11:00 AM [facility]  05/30/2023 10:00 AM Marin Olp, MD [facility]    Lab/Order associations:   ICD-10-CM   1. Controlled type 2 diabetes mellitus with stage 3 chronic kidney disease, without long-term current use of insulin (HCC)  E11.22 Hemoglobin A1c   N18.30 Comprehensive metabolic panel     2. Stage 3 chronic kidney disease, unspecified whether stage 3a or 3b CKD (HCC)  N18.30     3. Essential hypertension  I10     4. Hyperlipidemia associated with type 2 diabetes mellitus ([place])  E11.69    E78.5     5. Need for immunization against influenza  Z23 Flu Vaccine QUAD High Dose(Fluad)     No orders of the defined types were placed in this encounter.  Return precautions advised.  [name], MD

## 2022-11-21 NOTE — Patient Instructions (Addendum)
Get diabetic eye exam scheduled.  Please stop by lab before you go If you have mychart- we will send your results within 3 business days of [place] receiving them.  If you do not have mychart- we will call you about results within 5 business days of [place] receiving them.  *please also note that you will see labs on mychart as soon as they post. I will later go in and write notes on them- will say "notes from Dr. Yong Channel"    Recommended follow up: Return in about 6 months (around 05/22/2023) for physical or sooner if needed.Schedule b4 you leave.  -wellness visit needs to be with Linda Golden- do not need to schedule with me (cancel June wellness with me)

## 2023-01-04 DIAGNOSIS — B351 Tinea unguium: Secondary | ICD-10-CM | POA: Diagnosis not present

## 2023-01-04 DIAGNOSIS — E1142 Type 2 diabetes mellitus with diabetic polyneuropathy: Secondary | ICD-10-CM | POA: Diagnosis not present

## 2023-01-04 DIAGNOSIS — L84 Corns and callosities: Secondary | ICD-10-CM | POA: Diagnosis not present

## 2023-02-12 ENCOUNTER — Telehealth: Payer: Self-pay

## 2023-02-12 NOTE — Patient Outreach (Signed)
  Care Coordination   02/12/2023 Name: Linda Golden MRN: [id number] DOB: 01/12/1927   Care Coordination Outreach Attempts:  An unsuccessful telephone outreach was attempted today to offer the patient information about available care coordination services as a benefit of their health plan.   Follow Up Plan:  Additional outreach attempts will be made to offer the patient care coordination information and services.   Encounter Outcome:  No Answer   Care Coordination Interventions:  No, not indicated    Bevelyn Ngo, BSW, CDP Social Worker, Certified Dementia Practitioner [facility] Care Management  Care Coordination [phone]

## 2023-02-15 ENCOUNTER — Ambulatory Visit: Payer: Self-pay

## 2023-02-15 NOTE — Patient Outreach (Signed)
  Care Coordination   Initial Visit Note   02/15/2023 Name: Linda Golden MRN: [id number] DOB: [birth year]  Linda Golden is a 89 y.o. year old female who sees Durene Cal, Aldine Contes, MD for primary care. I spoke with  Kathy Breach by phone today.  What matters to the patients health and wellness today?  No concerns noted during today's call    Goals Addressed             This Visit's Progress    COMPLETED: Care Coordination Activities       Care Coordination Interventions: SDoH screening performed - no acute resource challenges identified at this time Determined the patient does not have concerns with medication costs and/or adherence Education provided on the role of the care coordination team - no follow up desired at this time Encouraged the patient to contact her primary care provider as needed         SDOH assessments and interventions completed:  Yes  SDOH Interventions Today    Flowsheet Row Most Recent Value  SDOH Interventions   Food Insecurity Interventions Intervention Not Indicated  Housing Interventions Intervention Not Indicated  Transportation Interventions Intervention Not Indicated  Utilities Interventions Intervention Not Indicated  Financial Strain Interventions Intervention Not Indicated        Care Coordination Interventions:  Yes, provided   Interventions Today    Flowsheet Row Most Recent Value  Chronic Disease   Chronic disease during today's visit Hypertension (HTN), Diabetes  General Interventions   General Interventions Discussed/Reviewed General Interventions Discussed, Doctor Visits  Doctor Visits Discussed/Reviewed Doctor Visits Reviewed  Education Interventions   Education Provided Provided Education  Provided Verbal Education On Other  [role of care coordination team]        Follow up plan: No further intervention required.   Encounter Outcome:  Pt. Visit Completed   Bevelyn Ngo, BSW, CDP Social Worker, Certified Dementia  Practitioner [facility] Care Management  Care Coordination [phone]

## 2023-02-15 NOTE — Patient Instructions (Signed)
Visit Information  Thank you for taking time to visit with me today. Please don't hesitate to contact me if I can be of assistance to you.   Following are the goals we discussed today:  -Contact your primary care provider as needed   If you are experiencing a Mental Health or Behavioral Health Crisis or need someone to talk to, please call the [facility]: [phone]  The patient verbalized understanding of instructions, educational materials, and care plan provided today and DECLINED offer to receive copy of patient instructions, educational materials, and care plan.   Bevelyn Ngo, BSW, CDP Social Worker, Certified Dementia Practitioner [facility] Care Management  Care Coordination [phone]

## 2023-02-16 ENCOUNTER — Ambulatory Visit (INDEPENDENT_AMBULATORY_CARE_PROVIDER_SITE_OTHER): Payer: PPO

## 2023-02-16 ENCOUNTER — Ambulatory Visit
Admission: EM | Admit: 2023-02-16 | Discharge: 2023-02-16 | Disposition: A | Discharge status: Home / Self Care | Payer: PPO | Attending: Physician Assistant | Admitting: Physician Assistant

## 2023-02-16 DIAGNOSIS — J329 Chronic sinusitis, unspecified: Secondary | ICD-10-CM

## 2023-02-16 DIAGNOSIS — J438 Other emphysema: Secondary | ICD-10-CM | POA: Diagnosis not present

## 2023-02-16 DIAGNOSIS — J40 Bronchitis, not specified as acute or chronic: Secondary | ICD-10-CM

## 2023-02-16 DIAGNOSIS — R059 Cough, unspecified: Secondary | ICD-10-CM | POA: Diagnosis not present

## 2023-02-16 DIAGNOSIS — J439 Emphysema, unspecified: Secondary | ICD-10-CM | POA: Diagnosis not present

## 2023-02-16 MED ORDER — DOXYCYCLINE HYCLATE 100 MG PO CAPS: 100.0000 mg | ORAL_CAPSULE | Freq: Two times a day (BID) | ORAL | 0 refills | Status: DC

## 2023-02-16 MED ORDER — FLUTICASONE PROPIONATE 50 MCG/ACT NA SUSP: 1.0000 | Freq: Every day | NASAL | 0 refills | Status: DC

## 2023-02-16 NOTE — Discharge Instructions (Signed)
Your x-ray showed emphysema which means that you are lungs are not exchanging air quite as well as they should.  This is common with age.  Please follow-up with your primary care about this.  We are going to cover you with an antibiotic given your increased mucus production.  Start doxycycline 100 mg twice daily.  Stay out of the sun while on this medication.  Use Flonase to help with your congestion.  I also recommend over-the-counter medication such as Mucinex and Tylenol.  If you have any worsening or changing symptoms including fever, worsening cough, shortness of breath you need to go to the emergency room.

## 2023-02-16 NOTE — ED Triage Notes (Signed)
Pt reports cough and phlegm x 3- days.

## 2023-02-16 NOTE — ED Provider Notes (Signed)
[facility]    CSN: [id number] Arrival date & time: 02/16/23  1200      History   Chief Complaint Chief Complaint  Patient presents with   Cough    HPI Linda Golden is a 89 y.o. female.   Patient presents today with a 3-day history of cough and congestion.  Reports that today she has been coughing up more mucus prompting evaluation.  Denies any fever, chest pain, shortness of breath, nausea, vomiting.  She has not been taking any over-the-counter medications.  Denies any known sick contacts.  She does have seasonal allergies and has been outside more recently and wonders if this could be contributing to symptoms.  She denies any recent antibiotics or steroids.  She denies history of asthma, smoking, COPD.  She does have a history of diabetes as well as chronic kidney disease but last checkup with her PCP February 2024 showed that this was stable.    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   Diabetes mellitus    Hyperlipidemia    Hypertension    Kidney mass    was told was born with this   Osteopenia     Patient Active Problem List   Diagnosis Date Noted   Diverticulitis of intestine with abscess 07/03/2015   CKD (chronic kidney disease), stage III (HCC) 10/23/2014   Raynaud's disease 10/08/2013   Osteoarthritis 07/01/2008   Varicose veins 11/04/2007   Controlled type 2 diabetes mellitus with renal manifestation (HCC) 04/18/2007   Hyperlipidemia associated with type 2 diabetes mellitus (HCC) 04/18/2007   Essential hypertension 04/18/2007   Osteopenia 04/18/2007    Past Surgical History:  Procedure Laterality Date   CATARACT EXTRACTION     bilateral planned-right done, left soon   foot spur      OB History   No obstetric history on file.      Home Medications    Prior to Admission medications   Medication Sig Start Date End Date Taking? Authorizing Provider  doxycycline (VIBRAMYCIN) 100 MG capsule  Take 1 capsule (100 mg total) by mouth 2 (two) times daily. 02/16/23  Yes Nalany Steedley K, PA-C  fluticasone (FLONASE) 50 MCG/ACT nasal spray Place 1 spray into both nostrils daily. 02/16/23  Yes Stavroula Rohde K, PA-C  amLODipine (NORVASC) 2.5 MG tablet Take 1 tablet (2.5 mg total) by mouth daily. 05/19/22   Shelva Majestic, MD  hydrochlorothiazide (HYDRODIURIL) 25 MG tablet Take 1 tablet (25 mg total) by mouth daily. 05/19/22   Shelva Majestic, MD  losartan (COZAAR) 100 MG tablet TAKE 1 TABLET BY MOUTH DAILY WITH HYDROCHLOROTHIAZIDE. 05/19/22   Shelva Majestic, MD    Family History Family History  Problem Relation Age of Onset   Hypertension Father    Alzheimer's disease Mother     Social History Social History   Tobacco Use   Smoking status: Never   Smokeless tobacco: Never  Vaping Use   Vaping Use: Never used  Substance Use Topics   Alcohol use: No   Drug use: No     Allergies   Lisinopril, Statins, and Sulfa antibiotics   Review of Systems Review of Systems  Constitutional:  Positive for activity change. Negative for appetite change, fatigue and fever.  HENT:  Positive for congestion. Negative for sinus pressure, sneezing and sore throat.   Respiratory:  Positive for cough. Negative for shortness of breath.   Cardiovascular:  Negative for chest pain.  Gastrointestinal:  Negative for abdominal pain, diarrhea, nausea and vomiting.     Physical Exam Triage Vital Signs ED Triage Vitals  Enc Vitals Group     BP 02/16/23 1235 (!) 155/62     Pulse Rate 02/16/23 1235 78     Resp 02/16/23 1235 18     Temp 02/16/23 1235 98.1 F (36.7 C)     Temp Source 02/16/23 1235 Oral     SpO2 02/16/23 1235 95 %     Weight --      Height --      Head Circumference --      Peak Flow --      Pain Score 02/16/23 1238 0     Pain Loc --      Pain Edu? --      Excl. in GC? --    No data found.  Updated Vital Signs BP (!) 155/62 (BP Location: Right Arm)   Pulse 78   Temp 98.1 F  (36.7 C) (Oral)   Resp 18   SpO2 95%   Visual Acuity Right Eye Distance:   Left Eye Distance:   Bilateral Distance:    Right Eye Near:   Left Eye Near:    Bilateral Near:     Physical Exam Vitals reviewed.  Constitutional:      General: She is awake. She is not in acute distress.    Appearance: Normal appearance. She is well-developed. She is not ill-appearing.     Comments: Very pleasant female appears stated age in no acute distress sitting comfortably in exam room  HENT:     Head: Normocephalic and atraumatic.     Right Ear: Tympanic membrane, ear canal and external ear normal. There is impacted cerumen. Tympanic membrane is not erythematous or bulging.     Left Ear: Tympanic membrane, ear canal and external ear normal. There is impacted cerumen. Tympanic membrane is not erythematous or bulging.     Nose:     Right Sinus: No maxillary sinus tenderness or frontal sinus tenderness.     Left Sinus: No maxillary sinus tenderness or frontal sinus tenderness.     Mouth/Throat:     Pharynx: Uvula midline. No oropharyngeal exudate or posterior oropharyngeal erythema.  Cardiovascular:     Rate and Rhythm: Normal rate and regular rhythm.     Heart sounds: Normal heart sounds, S1 normal and S2 normal. No murmur heard. Pulmonary:     Effort: Pulmonary effort is normal.     Breath sounds: Examination of the right-lower field reveals decreased breath sounds. Examination of the left-lower field reveals decreased breath sounds. Decreased breath sounds present. No wheezing, rhonchi or rales.  Psychiatric:        Behavior: Behavior is cooperative.      UC Treatments / Results  Labs (all labs ordered are listed, but only abnormal results are displayed) Labs Reviewed - No data to display  EKG   Radiology DG Chest 2 View  Result Date: 02/16/2023 CLINICAL DATA:  Cough EXAM: CHEST - 2 VIEW COMPARISON:  11/25/2008 FINDINGS: Transverse diameter of heart is increased. There are no signs  of pulmonary edema or focal pulmonary consolidation. There is no pleural effusion or pneumothorax. Emphysematous changes are noted in the upper lung fields. IMPRESSION: There are no signs of pulmonary edema or focal pulmonary consolidation. Emphysema. Electronically Signed   By: Ernie Avena M.D.   On: 02/16/2023 13:27    Procedures Procedures (including critical care  time)  Medications Ordered in UC Medications - No data to display  Initial Impression / Assessment and Plan / UC Course  I have reviewed the triage vital signs and the nursing notes.  Pertinent labs & imaging results that were available during my care of the patient were reviewed by me and considered in my medical decision making (see chart for details).     Patient is well-appearing, afebrile, nontoxic, nontachycardic.  X-ray was obtained given decreased aeration of bilateral bases that showed emphysema.  Given concern for underlying chronic pulmonary condition with recent worsening of symptoms including increased cough will cover for secondary bacterial infection with doxycycline 100 mg twice daily for 7 days.  Discussed that she should be out of the sun while on this medication due to associated photosensitivity.  She can use over-the-counter medications including Mucinex and Tylenol for symptom relief.  She was given Flonase to help manage nasal congestion and sinus symptoms.  Recommended close follow-up with her primary care.  She is to call them to schedule an appointment next week.  We discussed that if she has any worsening or changing symptoms including worsening cough, shortness of breath, fever, weakness she needs to be seen immediately.  Strict return precautions given.  Final Clinical Impressions(s) / UC Diagnoses   Final diagnoses:  Sinobronchitis  Other emphysema (HCC)     Discharge Instructions      Your x-ray showed emphysema which means that you are lungs are not exchanging air quite as well as they  should.  This is common with age.  Please follow-up with your primary care about this.  We are going to cover you with an antibiotic given your increased mucus production.  Start doxycycline 100 mg twice daily.  Stay out of the sun while on this medication.  Use Flonase to help with your congestion.  I also recommend over-the-counter medication such as Mucinex and Tylenol.  If you have any worsening or changing symptoms including fever, worsening cough, shortness of breath you need to go to the emergency room.     ED Prescriptions     Medication Sig Dispense Auth. Provider   doxycycline (VIBRAMYCIN) 100 MG capsule Take 1 capsule (100 mg total) by mouth 2 (two) times daily. 14 capsule Ana Woodroof K, PA-C   fluticasone (FLONASE) 50 MCG/ACT nasal spray Place 1 spray into both nostrils daily. 16 g Nilza Eaker K, PA-C      PDMP not reviewed this encounter.   Jeani Hawking, PA-C 02/16/23 1338

## 2023-03-22 DIAGNOSIS — E1142 Type 2 diabetes mellitus with diabetic polyneuropathy: Secondary | ICD-10-CM | POA: Diagnosis not present

## 2023-03-22 DIAGNOSIS — L84 Corns and callosities: Secondary | ICD-10-CM | POA: Diagnosis not present

## 2023-03-22 DIAGNOSIS — B351 Tinea unguium: Secondary | ICD-10-CM | POA: Diagnosis not present

## 2023-04-09 ENCOUNTER — Encounter: Payer: PPO | Admitting: Family Medicine

## 2023-04-10 DIAGNOSIS — H43393 Other vitreous opacities, bilateral: Secondary | ICD-10-CM | POA: Diagnosis not present

## 2023-04-10 LAB — HM DIABETES EYE EXAM

## 2023-04-16 ENCOUNTER — Ambulatory Visit (INDEPENDENT_AMBULATORY_CARE_PROVIDER_SITE_OTHER): Payer: PPO

## 2023-04-16 VITALS — BP 129/54 | Wt 119.0 lb

## 2023-04-16 DIAGNOSIS — Z0000 Encounter for general adult medical examination without abnormal findings: Secondary | ICD-10-CM

## 2023-04-16 NOTE — Progress Notes (Signed)
Subjective:   Linda Golden is a 89 y.o. female who presents for Medicare Annual (Subsequent) preventive examination.  Visit Complete: Virtual  I connected with  Kathy Breach on 04/16/23 by a audio enabled telemedicine application and verified that I am speaking with the correct person using two identifiers.  Patient Location: Home  Provider Location: Office/Clinic  I discussed the limitations of evaluation and management by telemedicine. The patient expressed understanding and agreed to proceed.    Review of Systems     Cardiac Risk Factors include: advanced age (>50men, >25 women);diabetes mellitus;dyslipidemia;hypertension     Objective:    Today's Vitals   04/16/23 1052  BP: (!) 129/54  Weight: 119 lb (54 kg)   Body mass index is 21.08 kg/m.     04/16/2023   11:03 AM 03/31/2022    9:01 AM 12/13/2020   11:54 AM 11/06/2019   11:03 AM 05/30/2017   11:10 AM 07/03/2015   11:47 AM  Advanced Directives  Does Patient Have a Medical Advance Directive? Yes No No Yes No No  Type of Estate agent of [place];Living will   Living will;Healthcare Power of Attorney    Does patient want to make changes to medical advance directive?    No - Patient declined    Copy of Healthcare Power of Attorney in Chart? No - copy requested   No - copy requested    Would patient like information on creating a medical advance directive?  No - Patient declined No - Patient declined   No - patient declined information    Current Medications (verified) Outpatient Encounter Medications as of 04/16/2023  Medication Sig   amLODipine (NORVASC) 2.5 MG tablet Take 1 tablet (2.5 mg total) by mouth daily.   fluticasone (FLONASE) 50 MCG/ACT nasal spray Place 1 spray into both nostrils daily.   hydrochlorothiazide (HYDRODIURIL) 25 MG tablet Take 1 tablet (25 mg total) by mouth daily.   losartan (COZAAR) 100 MG tablet TAKE 1 TABLET BY MOUTH DAILY WITH HYDROCHLOROTHIAZIDE.   doxycycline  (VIBRAMYCIN) 100 MG capsule Take 1 capsule (100 mg total) by mouth 2 (two) times daily. (Patient not taking: Reported on 04/16/2023)   No facility-administered encounter medications on file as of 04/16/2023.    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   Diabetes mellitus    Hyperlipidemia    Hypertension    Kidney mass    was told was born with this   Osteopenia    Past Surgical History:  Procedure Laterality Date   CATARACT EXTRACTION     bilateral planned-right done, left soon   foot spur     Family History  Problem Relation Age of Onset   Hypertension Father    Alzheimer's disease Mother    Social History   Socioeconomic History   Marital status: Widowed    Spouse name: Not on file   Number of children: Not on file   Years of education: Not on file   Highest education level: Not on file  Occupational History   Not on file  Tobacco Use   Smoking status: Never   Smokeless tobacco: Never  Vaping Use   Vaping Use: Never used  Substance and Sexual Activity   Alcohol use: No   Drug use: No   Sexual activity: Not Currently  Other Topics Concern   Not on  file  Social History Narrative   Widowed 2004. 1 son. 2 adopted grandchildren, 2 greatgrandkids. Assists in the care of autistic grandchild    [place] farm. Son lives close as well as grandson (next door). Completely independent. Still mows her yard.       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       Hobbies: reading, work outside      Chemical engineer Strain: Low Risk  (04/16/2023)   Overall Financial Resource Strain (CARDIA)    Difficulty of Paying Living Expenses: Not hard at all  Food Insecurity: No Food Insecurity (04/16/2023)   Hunger Vital Sign    Worried About Running Out of Food in the Last Year: Never true    Ran Out of Food in the Last  Year: Never true  Transportation Needs: No Transportation Needs (04/16/2023)   PRAPARE - Administrator, Civil Service (Medical): No    Lack of Transportation (Non-Medical): No  Physical Activity: Inactive (04/16/2023)   Exercise Vital Sign    Days of Exercise per Week: 0 days    Minutes of Exercise per Session: 0 min  Stress: No Stress Concern Present (04/16/2023)   [employer] of Occupational Health - Occupational Stress Questionnaire    Feeling of Stress : Not at all  Social Connections: Moderately Isolated (04/16/2023)   Social Connection and Isolation Panel [NHANES]    Frequency of Communication with Friends and Family: More than three times a week    Frequency of Social Gatherings with Friends and Family: More than three times a week    Attends Religious Services: More than 4 times per year    Active Member of [employer] or Organizations: No    Attends Banker Meetings: Never    Marital Status: Widowed    Tobacco Counseling Counseling given: Not Answered   Clinical Intake:  Pre-visit preparation completed: Yes  Pain : No/denies pain     BMI - recorded: 21.08 Nutritional Status: BMI of 19-24  Normal Nutritional Risks: None Diabetes: Yes CBG done?: No CBG resulted in Enter/ Edit results?: No Did pt. bring in CBG monitor from home?: No  How often do you need to have someone help you when you read instructions, pamphlets, or other written materials from your doctor or pharmacy?: 1 - Never  Interpreter Needed?: No  Information entered by :: Lanier Ensign, LPN   Activities of Daily Living    04/16/2023   10:56 AM  In your present state of health, do you have any difficulty performing the following activities:  Hearing? 1  Comment hearing aids  Vision? 0  Difficulty concentrating or making decisions? 0  Walking or climbing stairs? 0  Dressing or bathing? 0  Doing errands, shopping? 0  Preparing Food and eating ? N  Using the Toilet? N  In  the past six months, have you accidently leaked urine? N  Do you have problems with loss of bowel control? N  Managing your Medications? N  Managing your Finances? N  Housekeeping or managing your Housekeeping? N    Patient Care Team: Shelva Majestic, MD as PCP - General (Family Medicine) Erroll Luna, [facility] (Inactive) (Pharmacist)  Indicate any recent Medical Services you may have received from other than [facility] providers in the past year (date may be approximate).     Assessment:   This is a routine wellness examination for Aava.  Hearing/Vision screen Hearing Screening -  Comments:: Pt HOH has hearing aids  Vision Screening - Comments:: Pt follows up with Dr Willey Blade for annual eye exams   Dietary issues and exercise activities discussed:     Goals Addressed   None    Depression Screen    04/16/2023   11:01 AM 11/21/2022   10:54 AM 03/31/2022    9:00 AM 04/22/2021    2:48 PM 12/13/2020   11:53 AM 11/06/2019   11:04 AM 07/17/2019    2:25 PM  PHQ 2/9 Scores  PHQ - 2 Score 0 0 0 0 0 0 0  PHQ- 9 Score  2         Fall Risk    04/16/2023   11:04 AM 11/21/2022   10:45 AM 03/31/2022    9:03 AM 06/14/2021   10:31 AM 04/22/2021    2:48 PM  Fall Risk   Falls in the past year? 1 1 1 1  0  Number falls in past yr: 1 0 1 0 0  Injury with Fall? 0 0 1 1 0  Comment   right arm and knee Pain in knee and arm last Sunday had a fall in the kitchen   Risk for fall due to : Impaired vision;Impaired balance/gait No Fall Risks Impaired vision  No Fall Risks  Follow up Falls prevention discussed Falls evaluation completed Falls prevention discussed  Falls evaluation completed    MEDICARE RISK AT HOME:  Medicare Risk at Home - 04/16/23 1104     Any stairs in or around the home? No    If so, are there any without handrails? No    Home free of loose throw rugs in walkways, pet beds, electrical cords, etc? Yes    Adequate lighting in your home to reduce risk of falls? Yes    Life alert? No     Use of a cane, walker or w/c? No    Grab bars in the bathroom? Yes    Shower chair or bench in shower? Yes    Elevated toilet seat or a handicapped toilet? No             TIMED UP AND GO:  Was the test performed?  No    Cognitive Function:        03/31/2022    9:08 AM 11/06/2019   11:05 AM 05/30/2017   11:13 AM  6CIT Screen  What Year? 0 points 0 points 0 points  What month? 0 points 0 points 0 points  What time? 0 points 0 points 0 points  Count back from 20 0 points 0 points 0 points  Months in reverse 0 points 0 points 0 points  Repeat phrase 0 points 0 points 0 points  Total Score 0 points 0 points 0 points    Immunizations Immunization History  Administered Date(s) Administered   Fluad Quad(high Dose 65+) 08/24/2020, 11/21/2022   Influenza Split 07/27/2011, 07/08/2012   Influenza Whole 07/16/2006, 08/22/2007, 07/01/2008, 07/30/2009, 06/29/2010   Influenza, High Dose Seasonal PF 08/18/2016, 08/03/2017, 08/19/2018   Influenza,inj,Quad PF,6+ Mos 08/01/2013, 08/05/2014, 07/13/2015   Moderna Sars-Covid-2 Vaccination 12/11/2019, 01/09/2020, 09/30/2020   Pneumococcal Conjugate-13 05/24/2015   Pneumococcal Polysaccharide-23 02/14/1999   Td 10/16/2005   Tdap 03/01/2018    TDAP status: Up to date  Flu Vaccine status: Up to date  Pneumococcal vaccine status: Up to date  Covid-19 vaccine status: Completed vaccines  Qualifies for Shingles Vaccine? Yes   Zostavax completed No   Shingrix Completed?: No.    Education  has been provided regarding the importance of this vaccine. Patient has been advised to call insurance company to determine out of pocket expense if they have not yet received this vaccine. Advised may also receive vaccine at local pharmacy or Health Dept. Verbalized acceptance and understanding.  Screening Tests Health Maintenance  Topic Date Due   COVID-19 Vaccine (4 - 2023-24 season) 06/16/2022   Zoster Vaccines- Shingrix (1 of 2) 07/17/2023  (Originally 03/18/1977)   INFLUENZA VACCINE  05/17/2023   FOOT EXAM  05/20/2023   HEMOGLOBIN A1C  05/22/2023   OPHTHALMOLOGY EXAM  04/09/2024   Medicare Annual Wellness (AWV)  04/15/2024   DTaP/Tdap/Td (3 - Td or Tdap) 03/01/2028   Pneumonia Vaccine 54+ Years old  Completed   DEXA SCAN  Addressed   HPV VACCINES  Aged Out    Health Maintenance  Health Maintenance Due  Topic Date Due   COVID-19 Vaccine (4 - 2023-24 season) 06/16/2022    Colorectal cancer screening: No longer required.   Mammogram status: No longer required due to age .  Bone Density status: Completed 04/18/2007. Results reflect: Bone density results: OSTEOPENIA. Repeat every 04/18/2007 years.   Additional Screening:   Vision Screening: Recommended annual ophthalmology exams for early detection of glaucoma and other disorders of the eye. Is the patient up to date with their annual eye exam?  Yes pt stated 04/10/23 Who is the provider or what is the name of the office in which the patient attends annual eye exams? Dr Willey Blade  If pt is not established with a provider, would they like to be referred to a provider to establish care? No .   Dental Screening: Recommended annual dental exams for proper oral hygiene  Diabetic Foot Exam: Diabetic Foot Exam: Completed 05/19/22  Community Resource Referral / Chronic Care Management: CRR required this visit?  No   CCM required this visit?  No     Plan:     I have personally reviewed and noted the following in the patient's chart:   Medical and social history Use of alcohol, tobacco or illicit drugs  Current medications and supplements including opioid prescriptions. Patient is not currently taking opioid prescriptions. Functional ability and status Nutritional status Physical activity Advanced directives List of other physicians Hospitalizations, surgeries, and ER visits in previous 12 months Vitals Screenings to include cognitive, depression, and falls Referrals  and appointments  In addition, I have reviewed and discussed with patient certain preventive protocols, quality metrics, and best practice recommendations. A written personalized care plan for preventive services as well as general preventive health recommendations were provided to patient.     Marzella Schlein, LPN   4/0/9811   After Visit Summary: (Declined) Due to this being a telephonic visit, with patients personalized plan was offered to patient but patient Declined AVS at this time   Nurse Notes: none

## 2023-04-16 NOTE — Patient Instructions (Signed)
Linda Golden , Thank you for taking time to come for your Medicare Wellness Visit. I appreciate your ongoing commitment to your health goals. Please review the following plan we discussed and let me know if I can assist you in the future.   These are the goals we discussed:  Goals      Increase water intake     Will drink water with your meals      patient     Stay active and healthy     Patient Stated     Stay healthy     Patient Stated     None at this time         This is a list of the screening recommended for you and due dates:  Health Maintenance  Topic Date Due   Zoster (Shingles) Vaccine (1 of 2) Never done   Eye exam for diabetics  08/21/2020   COVID-19 Vaccine (4 - 2023-24 season) 06/16/2022   Flu Shot  05/17/2023   Complete foot exam   05/20/2023   Hemoglobin A1C  05/22/2023   Medicare Annual Wellness Visit  04/15/2024   DTaP/Tdap/Td vaccine (3 - Td or Tdap) 03/01/2028   Pneumonia Vaccine  Completed   DEXA scan (bone density measurement)  Addressed   HPV Vaccine  Aged Out    Advanced directives: Please bring a copy of your health care power of attorney and living will to the office at your convenience.   Conditions/risks identified: stay healthy   Next appointment: Follow up in one year for your annual wellness visit    Preventive Care 65 Years and Older, Female Preventive care refers to lifestyle choices and visits with your health care provider that can promote health and wellness. What does preventive care include? A yearly physical exam. This is also called an annual well check. Dental exams once or twice a year. Routine eye exams. Ask your health care provider how often you should have your eyes checked. Personal lifestyle choices, including: Daily care of your teeth and gums. Regular physical activity. Eating a healthy diet. Avoiding tobacco and drug use. Limiting alcohol use. Practicing safe sex. Taking low-dose aspirin every day. Taking  vitamin and mineral supplements as recommended by your health care provider. What happens during an annual well check? The services and screenings done by your health care provider during your annual well check will depend on your age, overall health, lifestyle risk factors, and family history of disease. Counseling  Your health care provider may ask you questions about your: Alcohol use. Tobacco use. Drug use. Emotional well-being. Home and relationship well-being. Sexual activity. Eating habits. History of falls. Memory and ability to understand (cognition). Work and work Astronomer. Reproductive health. Screening  You may have the following tests or measurements: Height, weight, and BMI. Blood pressure. Lipid and cholesterol levels. These may be checked every 5 years, or more frequently if you are over 42 years old. Skin check. Lung cancer screening. You may have this screening every year starting at age 75 if you have a 30-pack-year history of smoking and currently smoke or have quit within the past 15 years. Fecal occult blood test (FOBT) of the stool. You may have this test every year starting at age 2. Flexible sigmoidoscopy or colonoscopy. You may have a sigmoidoscopy every 5 years or a colonoscopy every 10 years starting at age 7. Hepatitis C blood test. Hepatitis B blood test. Sexually transmitted disease (STD) testing. Diabetes screening. This is done by checking  your blood sugar (glucose) after you have not eaten for a while (fasting). You may have this done every 1-3 years. Bone density scan. This is done to screen for osteoporosis. You may have this done starting at age 16. Mammogram. This may be done every 1-2 years. Talk to your health care provider about how often you should have regular mammograms. Talk with your health care provider about your test results, treatment options, and if necessary, the need for more tests. Vaccines  Your health care provider may  recommend certain vaccines, such as: Influenza vaccine. This is recommended every year. Tetanus, diphtheria, and acellular pertussis (Tdap, Td) vaccine. You may need a Td booster every 10 years. Zoster vaccine. You may need this after age 77. Pneumococcal 13-valent conjugate (PCV13) vaccine. One dose is recommended after age 33. Pneumococcal polysaccharide (PPSV23) vaccine. One dose is recommended after age 59. Talk to your health care provider about which screenings and vaccines you need and how often you need them. This information is not intended to replace advice given to you by your health care provider. Make sure you discuss any questions you have with your health care provider. Document Released: 10/29/2015 Document Revised: 06/21/2016 Document Reviewed: 08/03/2015 Elsevier Interactive Patient Education  2017 [employer].  Fall Prevention in the Home Falls can cause injuries. They can happen to people of all ages. There are many things you can do to make your home safe and to help prevent falls. What can I do on the outside of my home? Regularly fix the edges of walkways and driveways and fix any cracks. Remove anything that might make you trip as you walk through a door, such as a raised step or threshold. Trim any bushes or trees on the path to your home. Use bright outdoor lighting. Clear any walking paths of anything that might make someone trip, such as rocks or tools. Regularly check to see if handrails are loose or broken. Make sure that both sides of any steps have handrails. Any raised decks and porches should have guardrails on the edges. Have any leaves, snow, or ice cleared regularly. Use sand or salt on walking paths during winter. Clean up any spills in your garage right away. This includes oil or grease spills. What can I do in the bathroom? Use night lights. Install grab bars by the toilet and in the tub and shower. Do not use towel bars as grab bars. Use non-skid  mats or decals in the tub or shower. If you need to sit down in the shower, use a plastic, non-slip stool. Keep the floor dry. Clean up any water that spills on the floor as soon as it happens. Remove soap buildup in the tub or shower regularly. Attach bath mats securely with double-sided non-slip rug tape. Do not have throw rugs and other things on the floor that can make you trip. What can I do in the bedroom? Use night lights. Make sure that you have a light by your bed that is easy to reach. Do not use any sheets or blankets that are too big for your bed. They should not hang down onto the floor. Have a firm chair that has side arms. You can use this for support while you get dressed. Do not have throw rugs and other things on the floor that can make you trip. What can I do in the kitchen? Clean up any spills right away. Avoid walking on wet floors. Keep items that you use a lot  in easy-to-reach places. If you need to reach something above you, use a strong step stool that has a grab bar. Keep electrical cords out of the way. Do not use floor polish or wax that makes floors slippery. If you must use wax, use non-skid floor wax. Do not have throw rugs and other things on the floor that can make you trip. What can I do with my stairs? Do not leave any items on the stairs. Make sure that there are handrails on both sides of the stairs and use them. Fix handrails that are broken or loose. Make sure that handrails are as long as the stairways. Check any carpeting to make sure that it is firmly attached to the stairs. Fix any carpet that is loose or worn. Avoid having throw rugs at the top or bottom of the stairs. If you do have throw rugs, attach them to the floor with carpet tape. Make sure that you have a light switch at the top of the stairs and the bottom of the stairs. If you do not have them, ask someone to add them for you. What else can I do to help prevent falls? Wear shoes  that: Do not have high heels. Have rubber bottoms. Are comfortable and fit you well. Are closed at the toe. Do not wear sandals. If you use a stepladder: Make sure that it is fully opened. Do not climb a closed stepladder. Make sure that both sides of the stepladder are locked into place. Ask someone to hold it for you, if possible. Clearly mark and make sure that you can see: Any grab bars or handrails. First and last steps. Where the edge of each step is. Use tools that help you move around (mobility aids) if they are needed. These include: Canes. Walkers. Scooters. Crutches. Turn on the lights when you go into a dark area. Replace any light bulbs as soon as they burn out. Set up your furniture so you have a clear path. Avoid moving your furniture around. If any of your floors are uneven, fix them. If there are any pets around you, be aware of where they are. Review your medicines with your doctor. Some medicines can make you feel dizzy. This can increase your chance of falling. Ask your doctor what other things that you can do to help prevent falls. This information is not intended to replace advice given to you by your health care provider. Make sure you discuss any questions you have with your health care provider. Document Released: 07/29/2009 Document Revised: 03/09/2016 Document Reviewed: 11/06/2014 Elsevier Interactive Patient Education  2017 [employer].

## 2023-04-18 NOTE — Progress Notes (Signed)
Subjective:   ZAKIYAH DASARI is a 89 y.o. female who presents for Medicare Annual (Subsequent) preventive examination.  Visit Complete: Virtual  I connected with  Kathy Breach on 04/18/23 by a audio enabled telemedicine application and verified that I am speaking with the correct person using two identifiers.  Patient Location: Home  Provider Location: Office/Clinic  I discussed the limitations of evaluation and management by telemedicine. The patient expressed understanding and agreed to proceed.    Review of Systems     Cardiac Risk Factors include: advanced age (>44men, >82 women);diabetes mellitus;dyslipidemia;hypertension     Objective:    Today's Vitals   04/16/23 1052  BP: (!) 129/54  Weight: 119 lb (54 kg)   Body mass index is 21.08 kg/m.     04/16/2023   11:03 AM 03/31/2022    9:01 AM 12/13/2020   11:54 AM 11/06/2019   11:03 AM 05/30/2017   11:10 AM 07/03/2015   11:47 AM  Advanced Directives  Does Patient Have a Medical Advance Directive? Yes No No Yes No No  Type of Estate agent of [place];Living will   Living will;Healthcare Power of Attorney    Does patient want to make changes to medical advance directive?    No - Patient declined    Copy of Healthcare Power of Attorney in Chart? No - copy requested   No - copy requested    Would patient like information on creating a medical advance directive?  No - Patient declined No - Patient declined   No - patient declined information    Current Medications (verified) Outpatient Encounter Medications as of 04/16/2023  Medication Sig   amLODipine (NORVASC) 2.5 MG tablet Take 1 tablet (2.5 mg total) by mouth daily.   fluticasone (FLONASE) 50 MCG/ACT nasal spray Place 1 spray into both nostrils daily.   hydrochlorothiazide (HYDRODIURIL) 25 MG tablet Take 1 tablet (25 mg total) by mouth daily.   losartan (COZAAR) 100 MG tablet TAKE 1 TABLET BY MOUTH DAILY WITH HYDROCHLOROTHIAZIDE.   doxycycline  (VIBRAMYCIN) 100 MG capsule Take 1 capsule (100 mg total) by mouth 2 (two) times daily. (Patient not taking: Reported on 04/16/2023)   No facility-administered encounter medications on file as of 04/16/2023.    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   Diabetes mellitus    Hyperlipidemia    Hypertension    Kidney mass    was told was born with this   Osteopenia    Past Surgical History:  Procedure Laterality Date   CATARACT EXTRACTION     bilateral planned-right done, left soon   foot spur     Family History  Problem Relation Age of Onset   Hypertension Father    Alzheimer's disease Mother    Social History   Socioeconomic History   Marital status: Widowed    Spouse name: Not on file   Number of children: Not on file   Years of education: Not on file   Highest education level: Not on file  Occupational History   Not on file  Tobacco Use   Smoking status: Never   Smokeless tobacco: Never  Vaping Use   Vaping Use: Never used  Substance and Sexual Activity   Alcohol use: No   Drug use: No   Sexual activity: Not Currently  Other Topics Concern   Not on  file  Social History Narrative   Widowed 2004. 1 son. 2 adopted grandchildren, 2 greatgrandkids. Assists in the care of autistic grandchild    [place]. Son lives close as well as grandson (next door). Completely independent. Still mows her yard.       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       Hobbies: reading, work outside      Chemical engineer Strain: Low Risk  (04/16/2023)   Overall Financial Resource Strain (CARDIA)    Difficulty of Paying Living Expenses: Not hard at all  Food Insecurity: No Food Insecurity (04/16/2023)   Hunger Vital Sign    Worried About Running Out of Food in the Last Year: Never true    Ran Out of Food in the Last  Year: Never true  Transportation Needs: No Transportation Needs (04/16/2023)   PRAPARE - Administrator, Civil Service (Medical): No    Lack of Transportation (Non-Medical): No  Physical Activity: Inactive (04/16/2023)   Exercise Vital Sign    Days of Exercise per Week: 0 days    Minutes of Exercise per Session: 0 min  Stress: No Stress Concern Present (04/16/2023)   [employer] of Occupational Health - Occupational Stress Questionnaire    Feeling of Stress : Not at all  Social Connections: Moderately Isolated (04/16/2023)   Social Connection and Isolation Panel [NHANES]    Frequency of Communication with Friends and Family: More than three times a week    Frequency of Social Gatherings with Friends and Family: More than three times a week    Attends Religious Services: More than 4 times per year    Active Member of [employer] or Organizations: No    Attends Banker Meetings: Never    Marital Status: Widowed    Tobacco Counseling Counseling given: Not Answered   Clinical Intake:  Pre-visit preparation completed: Yes  Pain : No/denies pain     BMI - recorded: 21.08 Nutritional Status: BMI of 19-24  Normal Nutritional Risks: None Diabetes: Yes CBG done?: No CBG resulted in Enter/ Edit results?: No Did pt. bring in CBG monitor from home?: No  How often do you need to have someone help you when you read instructions, pamphlets, or other written materials from your doctor or pharmacy?: 1 - Never  Interpreter Needed?: No  Information entered by :: Lanier Ensign, LPN   Activities of Daily Living    04/16/2023   10:56 AM  In your present state of health, do you have any difficulty performing the following activities:  Hearing? 1  Comment hearing aids  Vision? 0  Difficulty concentrating or making decisions? 0  Walking or climbing stairs? 0  Dressing or bathing? 0  Doing errands, shopping? 0  Preparing Food and eating ? N  Using the Toilet? N  In  the past six months, have you accidently leaked urine? N  Do you have problems with loss of bowel control? N  Managing your Medications? N  Managing your Finances? N  Housekeeping or managing your Housekeeping? N    Patient Care Team: Shelva Majestic, MD as PCP - General (Family Medicine) Erroll Luna, [facility] (Inactive) (Pharmacist)  Indicate any recent Medical Services you may have received from other than [facility] providers in the past year (date may be approximate).     Assessment:   This is a routine wellness examination for Dorismar.  Hearing/Vision screen Hearing Screening -  Comments:: Pt HOH has hearing aids  Vision Screening - Comments:: Pt follows up with Dr Willey Blade for annual eye exams   Dietary issues and exercise activities discussed:     Goals Addressed   None   Depression Screen    04/16/2023   11:01 AM 11/21/2022   10:54 AM 03/31/2022    9:00 AM 04/22/2021    2:48 PM 12/13/2020   11:53 AM 11/06/2019   11:04 AM 07/17/2019    2:25 PM  PHQ 2/9 Scores  PHQ - 2 Score 0 0 0 0 0 0 0  PHQ- 9 Score  2         Fall Risk    04/16/2023   11:04 AM 11/21/2022   10:45 AM 03/31/2022    9:03 AM 06/14/2021   10:31 AM 04/22/2021    2:48 PM  Fall Risk   Falls in the past year? 1 1 1 1  0  Number falls in past yr: 1 0 1 0 0  Injury with Fall? 0 0 1 1 0  Comment   right arm and knee Pain in knee and arm last Sunday had a fall in the kitchen   Risk for fall due to : Impaired vision;Impaired balance/gait No Fall Risks Impaired vision  No Fall Risks  Follow up Falls prevention discussed Falls evaluation completed Falls prevention discussed  Falls evaluation completed    MEDICARE RISK AT HOME:    TIMED UP AND GO:  Was the test performed?  No    Cognitive Function:        03/31/2022    9:08 AM 11/06/2019   11:05 AM 05/30/2017   11:13 AM  6CIT Screen  What Year? 0 points 0 points 0 points  What month? 0 points 0 points 0 points  What time? 0 points 0 points 0 points  Count  back from 20 0 points 0 points 0 points  Months in reverse 0 points 0 points 0 points  Repeat phrase 0 points 0 points 0 points  Total Score 0 points 0 points 0 points    Immunizations Immunization History  Administered Date(s) Administered   Fluad Quad(high Dose 65+) 08/24/2020, 11/21/2022   Influenza Split 07/27/2011, 07/08/2012   Influenza Whole 07/16/2006, 08/22/2007, 07/01/2008, 07/30/2009, 06/29/2010   Influenza, High Dose Seasonal PF 08/18/2016, 08/03/2017, 08/19/2018   Influenza,inj,Quad PF,6+ Mos 08/01/2013, 08/05/2014, 07/13/2015   Moderna Sars-Covid-2 Vaccination 12/11/2019, 01/09/2020, 09/30/2020   Pneumococcal Conjugate-13 05/24/2015   Pneumococcal Polysaccharide-23 02/14/1999   Td 10/16/2005   Tdap 03/01/2018    TDAP status: Up to date  Flu Vaccine status: Up to date  Pneumococcal vaccine status: Up to date  Covid-19 vaccine status: Completed vaccines  Qualifies for Shingles Vaccine? Yes   Zostavax completed No   Shingrix Completed?: No.    Education has been provided regarding the importance of this vaccine. Patient has been advised to call insurance company to determine out of pocket expense if they have not yet received this vaccine. Advised may also receive vaccine at local pharmacy or Health Dept. Verbalized acceptance and understanding.  Screening Tests Health Maintenance  Topic Date Due   COVID-19 Vaccine (4 - 2023-24 season) 06/16/2022   Zoster Vaccines- Shingrix (1 of 2) 07/17/2023 (Originally 03/18/1977)   INFLUENZA VACCINE  05/17/2023   FOOT EXAM  05/20/2023   HEMOGLOBIN A1C  05/22/2023   OPHTHALMOLOGY EXAM  04/09/2024   Medicare Annual Wellness (AWV)  04/15/2024   DTaP/Tdap/Td (3 - Td or Tdap) 03/01/2028   Pneumonia  Vaccine 61+ Years old  Completed   DEXA SCAN  Addressed   HPV VACCINES  Aged Out    Health Maintenance  Health Maintenance Due  Topic Date Due   COVID-19 Vaccine (4 - 2023-24 season) 06/16/2022    Colorectal cancer screening:  No longer required.   Mammogram status: No longer required due to age .  Bone Density status: Completed 04/18/2007. Results reflect: Bone density results: OSTEOPENIA. Repeat every 04/18/2007 years.   Additional Screening:   Vision Screening: Recommended annual ophthalmology exams for early detection of glaucoma and other disorders of the eye. Is the patient up to date with their annual eye exam?  Yes pt stated 04/10/23 Who is the provider or what is the name of the office in which the patient attends annual eye exams? Dr Willey Blade  If pt is not established with a provider, would they like to be referred to a provider to establish care? No .   Dental Screening: Recommended annual dental exams for proper oral hygiene  Diabetic Foot Exam: Diabetic Foot Exam: Completed 05/19/22  Community Resource Referral / Chronic Care Management: CRR required this visit?  No   CCM required this visit?  No     Plan:     I have personally reviewed and noted the following in the patient's chart:   Medical and social history Use of alcohol, tobacco or illicit drugs  Current medications and supplements including opioid prescriptions. Patient is not currently taking opioid prescriptions. Functional ability and status Nutritional status Physical activity Advanced directives List of other physicians Hospitalizations, surgeries, and ER visits in previous 12 months Vitals Screenings to include cognitive, depression, and falls Referrals and appointments  In addition, I have reviewed and discussed with patient certain preventive protocols, quality metrics, and best practice recommendations. A written personalized care plan for preventive services as well as general preventive health recommendations were provided to patient.     Marzella Schlein, LPN   4/0/9811   After Visit Summary: (Declined) Due to this being a telephonic visit, with patients personalized plan was offered to patient but patient Declined  AVS at this time   Nurse Notes: cognition completed on 04/16/23

## 2023-05-30 ENCOUNTER — Encounter: Payer: Self-pay | Admitting: Family Medicine

## 2023-05-30 ENCOUNTER — Ambulatory Visit (INDEPENDENT_AMBULATORY_CARE_PROVIDER_SITE_OTHER): Payer: PPO | Admitting: Family Medicine

## 2023-05-30 VITALS — BP 138/72 | HR 89 | Temp 97.8°F | Ht 63.0 in | Wt 116.4 lb

## 2023-05-30 DIAGNOSIS — Z13220 Encounter for screening for lipoid disorders: Secondary | ICD-10-CM | POA: Diagnosis not present

## 2023-05-30 DIAGNOSIS — Z0000 Encounter for general adult medical examination without abnormal findings: Secondary | ICD-10-CM | POA: Diagnosis not present

## 2023-05-30 DIAGNOSIS — E1122 Type 2 diabetes mellitus with diabetic chronic kidney disease: Secondary | ICD-10-CM | POA: Diagnosis not present

## 2023-05-30 DIAGNOSIS — N183 Chronic kidney disease, stage 3 unspecified: Secondary | ICD-10-CM

## 2023-05-30 LAB — LIPID PANEL
Cholesterol: 265 mg/dL — ABNORMAL HIGH (ref 0–200)
HDL: 43.2 mg/dL (ref >39.00)
NonHDL: 221.5
Total CHOL/HDL Ratio: 6
Triglycerides: 311 mg/dL — ABNORMAL HIGH (ref 0.0–149.0)
VLDL: 62.2 mg/dL — ABNORMAL HIGH (ref 0.0–40.0)

## 2023-05-30 LAB — COMPREHENSIVE METABOLIC PANEL
ALT: 11 U/L (ref 0–35)
AST: 17 U/L (ref 0–37)
Albumin: 4.4 g/dL (ref 3.5–5.2)
Alkaline Phosphatase: 57 U/L (ref 39–117)
BUN: 25 mg/dL — ABNORMAL HIGH (ref 6–23)
CO2: 26 mEq/L (ref 19–32)
Calcium: 10.2 mg/dL (ref 8.4–10.5)
Chloride: 102 mEq/L (ref 96–112)
Creatinine, Ser: 1.09 mg/dL (ref 0.40–1.20)
GFR: 43.58 mL/min — ABNORMAL LOW (ref >60.00)
Glucose, Bld: 113 mg/dL — ABNORMAL HIGH (ref 70–99)
Potassium: 3.8 mEq/L (ref 3.5–5.1)
Sodium: 139 mEq/L (ref 135–145)
Total Bilirubin: 0.4 mg/dL (ref 0.2–1.2)
Total Protein: 7.4 g/dL (ref 6.0–8.3)

## 2023-05-30 LAB — CBC WITH DIFFERENTIAL/PLATELET
Basophils Absolute: 0 10*3/uL (ref 0.0–0.1)
Basophils Relative: 0.5 % (ref 0.0–3.0)
Eosinophils Absolute: 0.2 10*3/uL (ref 0.0–0.7)
Eosinophils Relative: 1.7 % (ref 0.0–5.0)
HCT: 40.2 % (ref 36.0–46.0)
Hemoglobin: 13.1 g/dL (ref 12.0–15.0)
Lymphocytes Relative: 33.8 % (ref 12.0–46.0)
Lymphs Abs: 3.3 10*3/uL (ref 0.7–4.0)
MCHC: 32.6 g/dL (ref 30.0–36.0)
MCV: 89.3 fl (ref 78.0–100.0)
Monocytes Absolute: 0.7 10*3/uL (ref 0.1–1.0)
Monocytes Relative: 7.3 % (ref 3.0–12.0)
Neutro Abs: 5.5 10*3/uL (ref 1.4–7.7)
Neutrophils Relative %: 56.7 % (ref 43.0–77.0)
Platelets: 239 10*3/uL (ref 150.0–400.0)
RBC: 4.5 Mil/uL (ref 3.87–5.11)
RDW: 13.9 % (ref 11.5–15.5)
WBC: 9.7 10*3/uL (ref 4.0–10.5)

## 2023-05-30 LAB — LDL CHOLESTEROL, DIRECT: Direct LDL: 225 mg/dL

## 2023-05-30 LAB — HEMOGLOBIN A1C: Hgb A1c MFr Bld: 6.6 % — ABNORMAL HIGH (ref 4.6–6.5)

## 2023-05-30 NOTE — Patient Instructions (Addendum)
Let us know If you get a flu or COVID vaccine this fall.  Glad you are doing well- let me know if you change your mind and want to do blood flow test for the legs  Please stop by lab before you go If you have mychart- we will send your results within 3 business days of [place] receiving them.  If you do not have mychart- we will call you about results within 5 business days of [place] receiving them.  *please also note that you will see labs on mychart as soon as they post. I will later go in and write notes on them- will say "notes from Dr. Durene Cal"   Recommended follow up: Return in about 6 months (around 11/30/2023) for followup or sooner if needed.Schedule b4 you leave.

## 2023-05-30 NOTE — Progress Notes (Signed)
Phone [phone]   Subjective:  Patient presents today for their annual physical. Chief complaint-noted.   See problem oriented charting- ROS- full  review of systems was completed and negative except for: joint pains  The following were reviewed and entered/updated in epic: 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   Diabetes mellitus    Hyperlipidemia    Hypertension    Kidney mass    was told was born with this   Osteopenia    Patient Active Problem List   Diagnosis Date Noted   Controlled type 2 diabetes mellitus with renal manifestation (HCC) 04/18/2007    Priority: High   Diverticulitis of intestine with abscess 07/03/2015    Priority: Medium    CKD (chronic kidney disease), stage III (HCC) 10/23/2014    Priority: Medium    Hyperlipidemia associated with type 2 diabetes mellitus (HCC) 04/18/2007    Priority: Medium    Essential hypertension 04/18/2007    Priority: Medium    Raynaud's disease 10/08/2013    Priority: Low   Osteoarthritis 07/01/2008    Priority: Low   Varicose veins 11/04/2007    Priority: Low   Osteopenia 04/18/2007    Priority: Low   Past Surgical History:  Procedure Laterality Date   CATARACT EXTRACTION     bilateral planned-right done, left soon   foot spur      Family History  Problem Relation Age of Onset   Hypertension Father    Alzheimer's disease Mother     Medications- reviewed and updated Current Outpatient Medications  Medication Sig Dispense Refill   amLODipine (NORVASC) 2.5 MG tablet Take 1 tablet (2.5 mg total) by mouth daily. 90 tablet 3   fluticasone (FLONASE) 50 MCG/ACT nasal spray Place 1 spray into both nostrils daily. 16 g 0   hydrochlorothiazide (HYDRODIURIL) 25 MG tablet Take 1 tablet (25 mg total) by mouth daily. 90 tablet 3   losartan (COZAAR) 100 MG tablet TAKE 1 TABLET BY MOUTH DAILY WITH HYDROCHLOROTHIAZIDE. 90 tablet 3   No current facility-administered  medications for this visit.    Allergies-reviewed and updated Allergies  Allergen Reactions   Lisinopril Cough   Statins Other (See Comments)    Hair loss   Sulfa Antibiotics Other (See Comments)    No reaction-patient states her family member was "burned" from this medication    Social History   Social History Narrative   Widowed 2004. 1 son. 2 adopted grandchildren, 2 greatgrandkids. Assists in the care of autistic grandchild    [place] farm. Son lives close as well as grandson (next door). Completely independent. Still mows her yard.       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       Hobbies: reading, work outside      Objective  Objective:  BP 138/72 Comment: usually 130/50s  Pulse 89   Temp 97.8 F (36.6 C)   Ht 5\' 3"  (1.6 m)   Wt 116 lb 6.4 oz (52.8 kg)   SpO2 97%   BMI 20.62 kg/m  Gen: NAD, resting comfortably HEENT: Mucous membranes are moist. Oropharynx normal, mild cerumen in bilateral ears but not obviously obstructive Neck: no thyromegaly CV: RRR no murmurs rubs or gallops Lungs: CTAB no crackles, wheeze, rhonchi Abdomen: soft/nontender/nondistended/normal bowel sounds. No rebound or guarding.  Ext: no edema Skin: warm, dry Neuro: grossly normal, moves all extremities, PERRLA, extremely hard of hearing  Diabetic Foot Exam - Simple   Simple Foot Form Diabetic Foot exam was performed with the following findings: Yes 05/30/2023 10:30 AM  Visual Inspection No deformities, no ulcerations, no other skin breakdown bilaterally: Yes Sensation Testing Intact to touch and monofilament testing bilaterally: Yes Pulse Check Posterior Tibialis and Dorsalis pulse intact bilaterally: Yes Comments Pulses only 1+    **   Assessment and Plan   89 y.o. female presenting for annual physical.  Health Maintenance counseling: 1. Anticipatory guidance: Patient counseled regarding regular dental exams -q6 months, eye exams - yearly,  avoiding smoking and  second hand smoke , limiting alcohol to 1 beverage per day , no illicit drugs .   2. Risk factor reduction:  Advised patient of need for regular exercise and diet rich and fruits and vegetables to reduce risk of heart attack and stroke.  Exercise- activity around the house.  Diet/weight management-weight down 2 pounds from last year-encouraged no further weight loss.  Wt Readings from Last 3 Encounters:  05/30/23 116 lb 6.4 oz (52.8 kg)  04/16/23 119 lb (54 kg)  11/21/22 118 lb 3.2 oz (53.6 kg)   3. Immunizations/screenings/ancillary studies-declines Shingrix.  Recommended fall flu shot and COVID-19 vaccination  Immunization History  Administered Date(s) Administered   Fluad Quad(high Dose 65+) 08/24/2020, 11/21/2022   Influenza Split 07/27/2011, 07/08/2012   Influenza Whole 07/16/2006, 08/22/2007, 07/01/2008, 07/30/2009, 06/29/2010   Influenza, High Dose Seasonal PF 08/18/2016, 08/03/2017, 08/19/2018   Influenza,inj,Quad PF,6+ Mos 08/01/2013, 08/05/2014, 07/13/2015   Moderna Sars-Covid-2 Vaccination 12/11/2019, 01/09/2020, 09/30/2020   Pneumococcal Conjugate-13 05/24/2015   Pneumococcal Polysaccharide-23 02/14/1999   Td 10/16/2005   Tdap 03/01/2018   4. Cervical cancer screening- past age based screening recommendations 5. Breast cancer screening-past age based screening recommendations-denies any palpable concerns   6. Colon cancer screening -past age based screening recommendations, no blood in stool or melena    7. Skin cancer screening-continues to follow with Dr. Margo Aye.  advised regular sunscreen use. Denies worrisome, changing, or new skin lesions.  8. Birth control/STD check- postmenopausal/not sexually active  9. Osteoporosis screening at 65- last bone density 2008-offered repeat but she declines  10. Smoking associated screening -never smoker  Status of chronic or acute concerns   # Social update-son with severe stroke and now blind since 2023.  She is caring for him and  driving him though today they had someone bring them (he is in the car).  Had already lost daughter-in-law in recent years and granddaughter is autistic and needs significant help -got billed $295 for wellness- team investigating  # Hard of hearing and does not like to wear her hearing aids-not wearing today  #Diabetes S: Diet controlled.   Lab Results  Component Value Date   HGBA1C 6.9 (H) 11/21/2022   HGBA1C 6.4 (H) 05/19/2022   HGBA1C 6.6 (H) 04/22/2021  A/P: Controlled without medicine-update A1c today   #Hypertension/CKD stage III S: Compliant with amlodipine 2.5 mg,losartan hydrochlorothiazide 100-25 mg (right now pill is being split).  GFR is have been largely stable in the 50s.  Knows to avoid NSAIDs.  On ACE-i case proteinuric element.  -130s at home over 50s BP Readings from Last 3 Encounters:  05/30/23 138/72  04/16/23 (!) 129/54  02/16/23 (!) 155/62   A/P: Reasonable control for her age-would not be overly aggressive especially with diastolic already down in the 50s at home  #hyperlipidemia S: Medication: none  . had hair loss in the past on statin.  - some coronary artery  calcifications on 2016 CT-does not want to try Zetia either Lab Results  Component Value Date   CHOL 257 (H) 05/19/2022   HDL 42 (L) 05/19/2022   LDLCALC 158 (H) 05/19/2022   LDLDIRECT 172 (H) 04/22/2021   TRIG 373 (H) 05/19/2022   CHOLHDL 6.1 (H) 05/19/2022     A/P: Suspect ongoing poor control but she is not interested in medicine with statin or even Zetia-encouraged healthy eating/regular exercise as best as able  # Occasional calf pain when going up to driving on an incline-she declined ABIs at last visit -pulses only appear to be 1+ at this time but she still declines-states at her age she is starting to slow down and may even start simply avoiding inclines if able-if worsen she should let us know or if she changes her mind  Recommended follow up: Return in about 6 months (around 11/30/2023)  for followup or sooner if needed.Schedule b4 you leave.  Lab/Order associations:NOT fasting   ICD-10-CM   1. Preventative health care  Z00.00     2. Controlled type 2 diabetes mellitus with stage 3 chronic kidney disease, without long-term current use of insulin (HCC)  E11.22 CBC with Differential/Platelet   N18.30 Comprehensive metabolic panel    Hemoglobin A1c    Lipid panel      No orders of the defined types were placed in this encounter.   Return precautions advised.  Tana Conch, MD

## 2023-06-05 ENCOUNTER — Telehealth: Payer: Self-pay | Admitting: Family Medicine

## 2023-06-05 DIAGNOSIS — L84 Corns and callosities: Secondary | ICD-10-CM | POA: Diagnosis not present

## 2023-06-05 DIAGNOSIS — E1142 Type 2 diabetes mellitus with diabetic polyneuropathy: Secondary | ICD-10-CM | POA: Diagnosis not present

## 2023-06-05 DIAGNOSIS — B351 Tinea unguium: Secondary | ICD-10-CM | POA: Diagnosis not present

## 2023-06-05 NOTE — Telephone Encounter (Signed)
Returned pt call and results reviewed.

## 2023-06-05 NOTE — Telephone Encounter (Signed)
Patient returned call in regards to lab results. Can be reached @ [phone].

## 2023-07-27 ENCOUNTER — Other Ambulatory Visit: Payer: Self-pay | Admitting: Family Medicine

## 2023-08-14 DIAGNOSIS — L84 Corns and callosities: Secondary | ICD-10-CM | POA: Diagnosis not present

## 2023-08-14 DIAGNOSIS — E1142 Type 2 diabetes mellitus with diabetic polyneuropathy: Secondary | ICD-10-CM | POA: Diagnosis not present

## 2023-08-14 DIAGNOSIS — B351 Tinea unguium: Secondary | ICD-10-CM | POA: Diagnosis not present

## 2023-12-03 ENCOUNTER — Encounter: Payer: Self-pay | Admitting: Family Medicine

## 2023-12-03 ENCOUNTER — Ambulatory Visit: Payer: PPO

## 2023-12-03 ENCOUNTER — Ambulatory Visit (INDEPENDENT_AMBULATORY_CARE_PROVIDER_SITE_OTHER): Payer: PPO | Admitting: Family Medicine

## 2023-12-03 VITALS — BP 138/64 | HR 78 | Temp 97.3°F | Ht 63.0 in | Wt 110.8 lb

## 2023-12-03 DIAGNOSIS — J439 Emphysema, unspecified: Secondary | ICD-10-CM | POA: Diagnosis not present

## 2023-12-03 DIAGNOSIS — E1169 Type 2 diabetes mellitus with other specified complication: Secondary | ICD-10-CM | POA: Diagnosis not present

## 2023-12-03 DIAGNOSIS — N183 Chronic kidney disease, stage 3 unspecified: Secondary | ICD-10-CM

## 2023-12-03 DIAGNOSIS — R634 Abnormal weight loss: Secondary | ICD-10-CM

## 2023-12-03 DIAGNOSIS — E1122 Type 2 diabetes mellitus with diabetic chronic kidney disease: Secondary | ICD-10-CM | POA: Diagnosis not present

## 2023-12-03 DIAGNOSIS — E785 Hyperlipidemia, unspecified: Secondary | ICD-10-CM

## 2023-12-03 DIAGNOSIS — R059 Cough, unspecified: Secondary | ICD-10-CM | POA: Diagnosis not present

## 2023-12-03 DIAGNOSIS — I10 Essential (primary) hypertension: Secondary | ICD-10-CM | POA: Diagnosis not present

## 2023-12-03 DIAGNOSIS — I739 Peripheral vascular disease, unspecified: Secondary | ICD-10-CM

## 2023-12-03 DIAGNOSIS — R053 Chronic cough: Secondary | ICD-10-CM

## 2023-12-03 LAB — COMPREHENSIVE METABOLIC PANEL
ALT: 11 U/L (ref 0–35)
AST: 16 U/L (ref 0–37)
Albumin: 4.5 g/dL (ref 3.5–5.2)
Alkaline Phosphatase: 70 U/L (ref 39–117)
BUN: 29 mg/dL — ABNORMAL HIGH (ref 6–23)
CO2: 27 meq/L (ref 19–32)
Calcium: 9.8 mg/dL (ref 8.4–10.5)
Chloride: 102 meq/L (ref 96–112)
Creatinine, Ser: 1.03 mg/dL (ref 0.40–1.20)
GFR: 46.48 mL/min — ABNORMAL LOW (ref >60.00)
Glucose, Bld: 114 mg/dL — ABNORMAL HIGH (ref 70–99)
Potassium: 4.6 meq/L (ref 3.5–5.1)
Sodium: 139 meq/L (ref 135–145)
Total Bilirubin: 0.5 mg/dL (ref 0.2–1.2)
Total Protein: 7.7 g/dL (ref 6.0–8.3)

## 2023-12-03 LAB — CBC WITH DIFFERENTIAL/PLATELET
Basophils Absolute: 0 10*3/uL (ref 0.0–0.1)
Basophils Relative: 0.4 % (ref 0.0–3.0)
Eosinophils Absolute: 0.1 10*3/uL (ref 0.0–0.7)
Eosinophils Relative: 0.9 % (ref 0.0–5.0)
HCT: 41.1 % (ref 36.0–46.0)
Hemoglobin: 13.5 g/dL (ref 12.0–15.0)
Lymphocytes Relative: 29.8 % (ref 12.0–46.0)
Lymphs Abs: 3.1 10*3/uL (ref 0.7–4.0)
MCHC: 32.8 g/dL (ref 30.0–36.0)
MCV: 89 fL (ref 78.0–100.0)
Monocytes Absolute: 0.7 10*3/uL (ref 0.1–1.0)
Monocytes Relative: 7.1 % (ref 3.0–12.0)
Neutro Abs: 6.5 10*3/uL (ref 1.4–7.7)
Neutrophils Relative %: 61.8 % (ref 43.0–77.0)
Platelets: 250 10*3/uL (ref 150.0–400.0)
RBC: 4.62 Mil/uL (ref 3.87–5.11)
RDW: 12.8 % (ref 11.5–15.5)
WBC: 10.5 10*3/uL (ref 4.0–10.5)

## 2023-12-03 LAB — MICROALBUMIN / CREATININE URINE RATIO
Creatinine,U: 127.4 mg/dL
Microalb Creat Ratio: 17.8 mg/g (ref 0.0–30.0)
Microalb, Ur: 2.3 mg/dL — ABNORMAL HIGH (ref 0.0–1.9)

## 2023-12-03 LAB — POCT GLYCOSYLATED HEMOGLOBIN (HGB A1C): Hemoglobin A1C: 6.3 % — AB (ref 4.0–5.6)

## 2023-12-03 LAB — URINALYSIS, ROUTINE W REFLEX MICROSCOPIC
Bilirubin Urine: NEGATIVE
Hgb urine dipstick: NEGATIVE
Ketones, ur: NEGATIVE
Nitrite: NEGATIVE
Specific Gravity, Urine: 1.025 (ref 1.000–1.030)
Total Protein, Urine: NEGATIVE
Urine Glucose: NEGATIVE
Urobilinogen, UA: 0.2 (ref 0.0–1.0)
pH: 5.5 (ref 5.0–8.0)

## 2023-12-03 LAB — C-REACTIVE PROTEIN: CRP: <1 mg/dL (ref 0.5–20.0)

## 2023-12-03 LAB — SEDIMENTATION RATE: Sed Rate: 36 mm/h — ABNORMAL HIGH (ref 0–30)

## 2023-12-03 LAB — TSH: TSH: 3.95 u[IU]/mL (ref 0.35–5.50)

## 2023-12-03 NOTE — Patient Instructions (Addendum)
 Try to increase protein intake - try to get more protein perhaps with ensure for diabetes or glucerna  We have placed a referral for you today to blood flow test for the legs. In some cases you will see # listed below- you can call this if you have not heard within a week. If you do not see # listed- you should receive a mychart message or phone call within a week with the # to call directly- call that as soon as you get it. If you are having issues getting scheduled reach out to [place] again.   Schedule follow up with dermatology right hand  Please stop by lab before you go If you have mychart- we will send your results within 3 business days of [place] receiving them.  If you do not have mychart- we will call you about results within 5 business days of [place] receiving them.  *please also note that you will see labs on mychart as soon as they post. I will later go in and write notes on them- will say "notes from Dr. Durene Cal"   Recommended follow up: Return in about 1 month (around 12/31/2023) for followup or sooner if needed.Schedule b4 you leave.

## 2023-12-03 NOTE — Progress Notes (Signed)
 Phone [phone] In person visit   Subjective:   Linda Golden is a 89 y.o. year old very pleasant female patient who presents for/with See problem oriented charting Chief Complaint  Patient presents with   Diabetes    Patient is present for follow up diabetes.Patient is concerned about a mole on her right hand.   Past Medical History-  Patient Active Problem List   Diagnosis Date Noted   Controlled type 2 diabetes mellitus with renal manifestation (HCC) 04/18/2007    Priority: High   Diverticulitis of intestine with abscess 07/03/2015    Priority: Medium    CKD (chronic kidney disease), stage III (HCC) 10/23/2014    Priority: Medium    Hyperlipidemia associated with type 2 diabetes mellitus (HCC) 04/18/2007    Priority: Medium    Essential hypertension 04/18/2007    Priority: Medium    Raynaud's disease 10/08/2013    Priority: Low   Osteoarthritis 07/01/2008    Priority: Low   Varicose veins 11/04/2007    Priority: Low   Osteopenia 04/18/2007    Priority: Low   Medications- reviewed and updated Current Outpatient Medications  Medication Sig Dispense Refill   amLODipine (NORVASC) 2.5 MG tablet TAKE (1) TABLET BY MOUTH ONCE DAILY. 90 tablet 3   fluticasone (FLONASE) 50 MCG/ACT nasal spray Place 1 spray into both nostrils daily. 16 g 0   hydrochlorothiazide (HYDRODIURIL) 25 MG tablet TAKE 1 TABLET BY MOUTH DAILY WITH LOSARTAN. 90 tablet 3   losartan (COZAAR) 100 MG tablet TAKE 1 TABLET BY MOUTH DAILY WITH HYDROCHLOROTHIAZIDE. 90 tablet 3   No current facility-administered medications for this visit.     Objective:  BP 138/64   Pulse 78   Temp (!) 97.3 F (36.3 C) (Temporal)   Ht 5\' 3"  (1.6 m)   Wt 110 lb 12.8 oz (50.3 kg)   SpO2 98%   BMI 19.63 kg/m  Gen: NAD, resting comfortably CV: RRR no murmurs rubs or gallops Lungs: CTAB no crackles, wheeze, rhonchi Abdomen: soft/nontender/nondistended/normal bowel sounds. No rebound or guarding.  Ext: no edema Skin:  warm, dry Neuro: Very hard of hearing    Assessment and Plan   # Hard of hearing and does not like to wear her hearing aids- offered tot type things out today but she declines  #Weight Loss- since July of last year down 9 lbs. She is trying to eat more but still losing weight. Want her to focus on protein to not lose muscle mass Wt Readings from Last 3 Encounters:  12/03/23 110 lb 12.8 oz (50.3 kg)  05/30/23 116 lb 6.4 oz (52.8 kg)  04/16/23 119 lb (54 kg)   #Diabetes S: Diet controlled.  CBGsdoesnt check . Sweet intake up Lab Results  Component Value Date   HGBA1C 6.3 (A) 12/03/2023   HGBA1C 6.6 (H) 05/30/2023   HGBA1C 6.9 (H) 11/21/2022  A/P: Remains well-controlled without medicine-continue monitor   #Hypertension/CKD stage III S: Compliant with amlodipine 2.5 mg,losartan hydrochlorothiazide 100-25 mg (right now pill is being split).  GFR is have been largely stable in the 50s.  Knows to avoid NSAIDs.  On ACE-i case proteinuric element.  BP Readings from Last 3 Encounters:  12/03/23 138/64  05/30/23 138/72  04/16/23 (!) 129/54  A/P: High acceptable blood pressure-continue current medication CKD III hopefully stable- update cmp today. Continue current meds for now   #hyperlipidemia S: Medication: none  . had hair loss in the past on statin.  - some coronary artery calcifications on  2016 CT-does not want to try Zetia either Lab Results  Component Value Date   CHOL 265 (H) 05/30/2023   HDL 43.20 05/30/2023   LDLCALC 158 (H) 05/19/2022   LDLDIRECT 225.0 05/30/2023   TRIG 311.0 (H) 05/30/2023   CHOLHDL 6 05/30/2023   A/P:  Lipids particularly LDL severely elevated-patient declined statin with prior side effect-discussed diarrhea and try to get her approved for medications like Repatha or Praluent but she declines   #Claudication- reports pain in calves when walking any significant distance. Weak pulse dp and patient- refer for abi  Recommended follow up: Return in about 1  month (around 12/31/2023) for followup or sooner if needed.Schedule b4 you leave.  Lab/Order associations:   ICD-10-CM   1. Hyperlipidemia associated with type 2 diabetes mellitus (HCC)  E11.69 Urine Microalbumin w/creat. ratio   E78.5 POCT HgB A1C    CBC with Differential/Platelet    Comprehensive metabolic panel    TSH    2. Controlled type 2 diabetes mellitus with stage 3 chronic kidney disease, without long-term current use of insulin (HCC) Chronic E11.22 CBC with Differential/Platelet   N18.30 Comprehensive metabolic panel    3. Essential hypertension  I10     4. Unintentional weight loss  R63.4 CBC with Differential/Platelet    Comprehensive metabolic panel    TSH    Fecal occult blood, imunochemical    DG Chest 2 View    Sedimentation rate    C-reactive protein    Urinalysis, Routine w reflex microscopic    5. Chronic cough  R05.3 DG Chest 2 View    6. Intermittent claudication (HCC)  I73.9 VAS [place] ABI WITH/WO TBI      No orders of the defined types were placed in this encounter.   Return precautions advised.  Tana Conch, MD

## 2023-12-04 ENCOUNTER — Other Ambulatory Visit: Payer: Self-pay

## 2023-12-04 DIAGNOSIS — R82998 Other abnormal findings in urine: Secondary | ICD-10-CM

## 2023-12-10 ENCOUNTER — Other Ambulatory Visit: Payer: PPO

## 2023-12-10 DIAGNOSIS — R82998 Other abnormal findings in urine: Secondary | ICD-10-CM | POA: Diagnosis not present

## 2023-12-11 LAB — URINE CULTURE
MICRO NUMBER:: 16119955
SPECIMEN QUALITY:: ADEQUATE

## 2023-12-12 ENCOUNTER — Other Ambulatory Visit (INDEPENDENT_AMBULATORY_CARE_PROVIDER_SITE_OTHER): Payer: PPO

## 2023-12-12 DIAGNOSIS — R634 Abnormal weight loss: Secondary | ICD-10-CM | POA: Diagnosis not present

## 2023-12-12 LAB — FECAL OCCULT BLOOD, IMMUNOCHEMICAL: Fecal Occult Bld: POSITIVE — AB

## 2023-12-13 ENCOUNTER — Emergency Department (HOSPITAL_COMMUNITY): Payer: PPO

## 2023-12-13 ENCOUNTER — Encounter (HOSPITAL_COMMUNITY): Payer: Self-pay | Admitting: Cardiology

## 2023-12-13 ENCOUNTER — Inpatient Hospital Stay (HOSPITAL_COMMUNITY)
Admission: EM | Admit: 2023-12-13 | Discharge: 2023-12-15 | DRG: 281 | Disposition: A | Discharge status: Home / Self Care | Payer: PPO | Attending: Internal Medicine | Admitting: Internal Medicine

## 2023-12-13 ENCOUNTER — Observation Stay (HOSPITAL_BASED_OUTPATIENT_CLINIC_OR_DEPARTMENT_OTHER): Payer: PPO

## 2023-12-13 ENCOUNTER — Other Ambulatory Visit: Payer: Self-pay

## 2023-12-13 DIAGNOSIS — I517 Cardiomegaly: Secondary | ICD-10-CM | POA: Diagnosis not present

## 2023-12-13 DIAGNOSIS — R634 Abnormal weight loss: Secondary | ICD-10-CM | POA: Diagnosis not present

## 2023-12-13 DIAGNOSIS — K219 Gastro-esophageal reflux disease without esophagitis: Secondary | ICD-10-CM | POA: Diagnosis present

## 2023-12-13 DIAGNOSIS — I214 Non-ST elevation (NSTEMI) myocardial infarction: Secondary | ICD-10-CM

## 2023-12-13 DIAGNOSIS — E876 Hypokalemia: Secondary | ICD-10-CM | POA: Diagnosis not present

## 2023-12-13 DIAGNOSIS — Z882 Allergy status to sulfonamides status: Secondary | ICD-10-CM

## 2023-12-13 DIAGNOSIS — D631 Anemia in chronic kidney disease: Secondary | ICD-10-CM | POA: Diagnosis present

## 2023-12-13 DIAGNOSIS — I2510 Atherosclerotic heart disease of native coronary artery without angina pectoris: Secondary | ICD-10-CM | POA: Diagnosis not present

## 2023-12-13 DIAGNOSIS — N1832 Chronic kidney disease, stage 3b: Secondary | ICD-10-CM | POA: Diagnosis present

## 2023-12-13 DIAGNOSIS — I252 Old myocardial infarction: Secondary | ICD-10-CM | POA: Diagnosis present

## 2023-12-13 DIAGNOSIS — Z888 Allergy status to other drugs, medicaments and biological substances status: Secondary | ICD-10-CM

## 2023-12-13 DIAGNOSIS — R509 Fever, unspecified: Secondary | ICD-10-CM | POA: Diagnosis not present

## 2023-12-13 DIAGNOSIS — E785 Hyperlipidemia, unspecified: Secondary | ICD-10-CM | POA: Diagnosis present

## 2023-12-13 DIAGNOSIS — H9190 Unspecified hearing loss, unspecified ear: Secondary | ICD-10-CM | POA: Diagnosis present

## 2023-12-13 DIAGNOSIS — R079 Chest pain, unspecified: Secondary | ICD-10-CM | POA: Diagnosis not present

## 2023-12-13 DIAGNOSIS — E1122 Type 2 diabetes mellitus with diabetic chronic kidney disease: Secondary | ICD-10-CM | POA: Diagnosis not present

## 2023-12-13 DIAGNOSIS — J439 Emphysema, unspecified: Secondary | ICD-10-CM | POA: Diagnosis not present

## 2023-12-13 DIAGNOSIS — R001 Bradycardia, unspecified: Secondary | ICD-10-CM | POA: Diagnosis not present

## 2023-12-13 DIAGNOSIS — Z7982 Long term (current) use of aspirin: Secondary | ICD-10-CM

## 2023-12-13 DIAGNOSIS — I129 Hypertensive chronic kidney disease with stage 1 through stage 4 chronic kidney disease, or unspecified chronic kidney disease: Secondary | ICD-10-CM | POA: Diagnosis not present

## 2023-12-13 DIAGNOSIS — N179 Acute kidney failure, unspecified: Secondary | ICD-10-CM | POA: Diagnosis present

## 2023-12-13 DIAGNOSIS — Z8249 Family history of ischemic heart disease and other diseases of the circulatory system: Secondary | ICD-10-CM

## 2023-12-13 DIAGNOSIS — R531 Weakness: Secondary | ICD-10-CM | POA: Diagnosis not present

## 2023-12-13 DIAGNOSIS — Z79899 Other long term (current) drug therapy: Secondary | ICD-10-CM

## 2023-12-13 DIAGNOSIS — I10 Essential (primary) hypertension: Secondary | ICD-10-CM | POA: Diagnosis not present

## 2023-12-13 DIAGNOSIS — Z820 Family history of epilepsy and other diseases of the nervous system: Secondary | ICD-10-CM

## 2023-12-13 DIAGNOSIS — I700 Atherosclerosis of aorta: Secondary | ICD-10-CM | POA: Diagnosis not present

## 2023-12-13 DIAGNOSIS — Z6821 Body mass index (BMI) 21.0-21.9, adult: Secondary | ICD-10-CM

## 2023-12-13 LAB — URINALYSIS, ROUTINE W REFLEX MICROSCOPIC
Bilirubin Urine: NEGATIVE
Glucose, UA: NEGATIVE mg/dL
Hgb urine dipstick: NEGATIVE
Ketones, ur: NEGATIVE mg/dL
Nitrite: POSITIVE — AB
Protein, ur: NEGATIVE mg/dL
Specific Gravity, Urine: 1.014 (ref 1.005–1.030)
pH: 5 (ref 5.0–8.0)

## 2023-12-13 LAB — ECHOCARDIOGRAM COMPLETE
Area-P 1/2: 2.54 cm2
S' Lateral: 1.6 cm

## 2023-12-13 LAB — COMPREHENSIVE METABOLIC PANEL
ALT: 14 U/L (ref 0–44)
AST: 25 U/L (ref 15–41)
Albumin: 4.2 g/dL (ref 3.5–5.0)
Alkaline Phosphatase: 62 U/L (ref 38–126)
Anion gap: 12 (ref 5–15)
BUN: 28 mg/dL — ABNORMAL HIGH (ref 8–23)
CO2: 26 mmol/L (ref 22–32)
Calcium: 9.9 mg/dL (ref 8.9–10.3)
Chloride: 100 mmol/L (ref 98–111)
Creatinine, Ser: 1.16 mg/dL — ABNORMAL HIGH (ref 0.44–1.00)
GFR, Estimated: 44 mL/min — ABNORMAL LOW (ref >60)
Glucose, Bld: 155 mg/dL — ABNORMAL HIGH (ref 70–99)
Potassium: 3.4 mmol/L — ABNORMAL LOW (ref 3.5–5.1)
Sodium: 138 mmol/L (ref 135–145)
Total Bilirubin: 0.9 mg/dL (ref 0.0–1.2)
Total Protein: 7.5 g/dL (ref 6.5–8.1)

## 2023-12-13 LAB — LIPASE, BLOOD: Lipase: 31 U/L (ref 11–51)

## 2023-12-13 LAB — CBC
HCT: 41.4 % (ref 36.0–46.0)
Hemoglobin: 13.5 g/dL (ref 12.0–15.0)
MCH: 29.3 pg (ref 26.0–34.0)
MCHC: 32.6 g/dL (ref 30.0–36.0)
MCV: 89.8 fL (ref 80.0–100.0)
Platelets: 220 10*3/uL (ref 150–400)
RBC: 4.61 MIL/uL (ref 3.87–5.11)
RDW: 12.4 % (ref 11.5–15.5)
WBC: 10 10*3/uL (ref 4.0–10.5)
nRBC: 0 % (ref 0.0–0.2)

## 2023-12-13 LAB — GLUCOSE, CAPILLARY
Glucose-Capillary: 108 mg/dL — ABNORMAL HIGH (ref 70–99)
Glucose-Capillary: 121 mg/dL — ABNORMAL HIGH (ref 70–99)

## 2023-12-13 LAB — HEPARIN LEVEL (UNFRACTIONATED): Heparin Unfractionated: 0.28 [IU]/mL — ABNORMAL LOW (ref 0.30–0.70)

## 2023-12-13 LAB — MRSA NEXT GEN BY PCR, NASAL: MRSA by PCR Next Gen: NOT DETECTED

## 2023-12-13 LAB — CBG MONITORING, ED: Glucose-Capillary: 157 mg/dL — ABNORMAL HIGH (ref 70–99)

## 2023-12-13 LAB — TROPONIN I (HIGH SENSITIVITY)
Troponin I (High Sensitivity): 671 ng/L (ref <18)
Troponin I (High Sensitivity): 674 ng/L (ref <18)

## 2023-12-13 MED ORDER — ONDANSETRON HCL 4 MG/2ML IJ SOLN
4.0000 mg | Freq: Once | INTRAMUSCULAR | Status: AC
  Administered 2023-12-13: 4 mg via INTRAVENOUS
  Filled 2023-12-13: qty 2

## 2023-12-13 MED ORDER — ASPIRIN 81 MG PO TBEC
81.0000 mg | DELAYED_RELEASE_TABLET | Freq: Every day | ORAL | Status: DC
  Administered 2023-12-13 – 2023-12-15 (×3): 81 mg via ORAL
  Filled 2023-12-13 (×3): qty 1

## 2023-12-13 MED ORDER — SODIUM CHLORIDE 0.9 % IV SOLN: 250.0000 mL | INTRAVENOUS | Status: AC | PRN

## 2023-12-13 MED ORDER — NITROGLYCERIN IN D5W 200-5 MCG/ML-% IV SOLN
5.0000 ug/min | INTRAVENOUS | Status: DC
  Filled 2023-12-13: qty 250

## 2023-12-13 MED ORDER — AMLODIPINE BESYLATE 5 MG PO TABS
5.0000 mg | ORAL_TABLET | Freq: Every day | ORAL | Status: DC
  Administered 2023-12-13 – 2023-12-15 (×2): 5 mg via ORAL
  Filled 2023-12-13 (×2): qty 1

## 2023-12-13 MED ORDER — PANTOPRAZOLE SODIUM 40 MG PO TBEC
40.0000 mg | DELAYED_RELEASE_TABLET | Freq: Every day | ORAL | Status: DC
  Administered 2023-12-13 – 2023-12-15 (×3): 40 mg via ORAL
  Filled 2023-12-13 (×3): qty 1

## 2023-12-13 MED ORDER — SODIUM CHLORIDE 0.9% FLUSH
3.0000 mL | Freq: Two times a day (BID) | INTRAVENOUS | Status: DC
  Administered 2023-12-13 – 2023-12-15 (×5): 3 mL via INTRAVENOUS

## 2023-12-13 MED ORDER — ONDANSETRON HCL 4 MG/2ML IJ SOLN: 4.0000 mg | Freq: Four times a day (QID) | INTRAMUSCULAR | Status: DC | PRN

## 2023-12-13 MED ORDER — CARVEDILOL 3.125 MG PO TABS: 3.1250 mg | ORAL_TABLET | Freq: Two times a day (BID) | ORAL | Status: DC

## 2023-12-13 MED ORDER — ATORVASTATIN CALCIUM 40 MG PO TABS
40.0000 mg | ORAL_TABLET | Freq: Every day | ORAL | Status: DC
  Administered 2023-12-13 – 2023-12-15 (×3): 40 mg via ORAL
  Filled 2023-12-13 (×3): qty 1

## 2023-12-13 MED ORDER — ACETAMINOPHEN 325 MG PO TABS
650.0000 mg | ORAL_TABLET | ORAL | Status: DC | PRN
  Administered 2023-12-14: 650 mg via ORAL
  Filled 2023-12-13: qty 2

## 2023-12-13 MED ORDER — MORPHINE SULFATE (PF) 4 MG/ML IV SOLN
1.0000 mg | Freq: Once | INTRAVENOUS | Status: DC
  Administered 2023-12-13: 1 mg via INTRAVENOUS
  Filled 2023-12-13: qty 1

## 2023-12-13 MED ORDER — INSULIN ASPART 100 UNIT/ML IJ SOLN
0.0000 [IU] | Freq: Three times a day (TID) | INTRAMUSCULAR | Status: DC
  Administered 2023-12-13: 2 [IU] via SUBCUTANEOUS
  Administered 2023-12-14 – 2023-12-15 (×2): 1 [IU] via SUBCUTANEOUS
  Filled 2023-12-13: qty 1

## 2023-12-13 MED ORDER — HEPARIN BOLUS VIA INFUSION
2500.0000 [IU] | Freq: Once | INTRAVENOUS | Status: AC
  Administered 2023-12-13: 2500 [IU] via INTRAVENOUS

## 2023-12-13 MED ORDER — HEPARIN (PORCINE) 25000 UT/250ML-% IV SOLN
700.0000 [IU]/h | INTRAVENOUS | Status: AC
  Administered 2023-12-13: 600 [IU]/h via INTRAVENOUS
  Administered 2023-12-14: 700 [IU]/h via INTRAVENOUS
  Filled 2023-12-13 (×2): qty 250

## 2023-12-13 MED ORDER — CHLORHEXIDINE GLUCONATE CLOTH 2 % EX PADS
6.0000 | MEDICATED_PAD | Freq: Every day | CUTANEOUS | Status: DC
  Administered 2023-12-14 – 2023-12-15 (×2): 6 via TOPICAL

## 2023-12-13 MED ORDER — LOSARTAN POTASSIUM 25 MG PO TABS
25.0000 mg | ORAL_TABLET | Freq: Every day | ORAL | Status: DC
  Administered 2023-12-13 – 2023-12-15 (×3): 25 mg via ORAL
  Filled 2023-12-13 (×3): qty 1

## 2023-12-13 MED ORDER — HEPARIN BOLUS VIA INFUSION
750.0000 [IU] | Freq: Once | INTRAVENOUS | Status: AC
  Administered 2023-12-13: 750 [IU] via INTRAVENOUS
  Filled 2023-12-13: qty 750

## 2023-12-13 MED ORDER — POTASSIUM CHLORIDE CRYS ER 20 MEQ PO TBCR
40.0000 meq | EXTENDED_RELEASE_TABLET | Freq: Once | ORAL | Status: AC
  Administered 2023-12-13: 40 meq via ORAL
  Filled 2023-12-13: qty 2

## 2023-12-13 MED ORDER — SODIUM CHLORIDE 0.9% FLUSH: 3.0000 mL | INTRAVENOUS | Status: DC | PRN

## 2023-12-13 NOTE — Progress Notes (Signed)
 PHARMACY - ANTICOAGULATION CONSULT NOTE  Pharmacy Consult for heparin Indication: chest pain/ACS  Allergies  Allergen Reactions   Lisinopril Cough   Statins Other (See Comments)    Hair loss   Sulfa Antibiotics Other (See Comments)    No reaction-patient states her family member was "burned" from this medication    Patient Measurements:   Heparin Dosing Weight: 50 kg  Vital Signs: Temp: 97.5 F (36.4 C) (02/27 1004) Temp Source: Oral (02/27 1004) BP: 158/61 (02/27 1030) Pulse Rate: 56 (02/27 1030)  Labs: Recent Labs    12/13/23 1015  HGB 13.5  HCT 41.4  PLT 220  CREATININE 1.16*  TROPONINIHS 671*    Estimated Creatinine Clearance: 23.5 mL/min (A) (by C-G formula based on SCr of 1.16 mg/dL (H)).   Medical History: 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   Diabetes mellitus    Hyperlipidemia    Hypertension    Kidney mass    was told was born with this   Osteopenia     Medications:  (Not in a hospital admission)   Assessment: Pharmacy consulted to dose heparin in patient with chest pain/ACS.  Patient is not on anticoagulation prior to admission.  CBC WNL Trop 671  Goal of Therapy:  Heparin level 0.3-0.7 units/ml Monitor platelets by anticoagulation protocol: Yes   Plan:  Give 2500 units bolus x 1 Start heparin infusion at 600 units/hr Check anti-Xa level in 8 hours and daily while on heparin Continue to monitor H&H and platelets  Judeth Cornfield, PharmD Clinical Pharmacist 12/13/2023 11:19 AM

## 2023-12-13 NOTE — Progress Notes (Signed)
 PHARMACY - ANTICOAGULATION CONSULT NOTE  Pharmacy Consult for heparin Indication: chest pain/ACS  Allergies  Allergen Reactions   Lisinopril Cough   Statins Other (See Comments)    Hair loss   Sulfa Antibiotics Other (See Comments)    No reaction-patient states her family member was "burned" from this medication    Patient Measurements: Height: 5\' 3"  (160 cm) Weight: 53.7 kg (118 lb 6.2 oz) IBW/kg (Calculated) : 52.4 Heparin Dosing Weight: 50 kg  Vital Signs: Temp: 98 F (36.7 C) (02/27 1510) Temp Source: Oral (02/27 1510) BP: 115/34 (02/27 1900) Pulse Rate: 63 (02/27 1900)  Labs: Recent Labs    12/13/23 1015 12/13/23 1210 12/13/23 1918  HGB 13.5  --   --   HCT 41.4  --   --   PLT 220  --   --   HEPARINUNFRC  --   --  0.28*  CREATININE 1.16*  --   --   TROPONINIHS 671* 674*  --     Estimated Creatinine Clearance: 24.5 mL/min (A) (by C-G formula based on SCr of 1.16 mg/dL (H)).   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 90 tablet 3 12/12/2023 Morning   fluticasone (FLONASE) 50 MCG/ACT nasal spray Place 1 spray into both nostrils daily. 16 g 0 Taking   hydrochlorothiazide (HYDRODIURIL) 25 MG tablet TAKE 1 TABLET BY MOUTH DAILY WITH LOSARTAN. 90 tablet 3 12/12/2023 Morning   losartan (COZAAR) 100 MG tablet TAKE 1 TABLET BY MOUTH DAILY WITH HYDROCHLOROTHIAZIDE. 90 tablet 3 12/12/2023 Morning    Assessment: Pharmacy consulted to dose heparin in patient with chest pain/ACS.  Patient is not on anticoagulation prior to admission.  CBC WNL Trop 671  Date Time HL Rate/Comment  2/27 1918 0.28 Subtherapeutic  Goal of  Therapy:  Heparin level 0.3-0.7 units/ml Monitor platelets by anticoagulation protocol: Yes   Plan:  Give heparin bolus of 750 units x1 Increase heparin infusion to 700 units/hour Check heparin level in 8 hours Monitor daily heparin levels Monitor CBC and signs/symptoms of bleeding  Thank you for involving pharmacy in this patient's care.   Rockwell Alexandria, PharmD Clinical Pharmacist 12/13/2023 7:38 PM

## 2023-12-13 NOTE — Progress Notes (Signed)
 Patient alert and oriented x4. Nitroglycerine drip was off when patient came to unit from ED at 1500. Heparin drip continues to infuse. Heart rate has been sinus brady high 40's into the 50's. Patient denied any chest pain, shortness of breath, nausea, vomiting or dizziness. Dr Gwenlyn Perking and Dr Diona Browner made aware. Patient up out of bed to bedside commode with stand-by assist. Patient tolerated well with no complaints. Patient sat up in bed for dinner, pleasant and had no complaints. Dr Gwenlyn Perking made aware of bp's. Patient for the most part, when not eating was resting in bed and when asked if ok, she said yes, I would like another blanket to keep warm. Heat turned up in room and blankets applied.

## 2023-12-13 NOTE — Progress Notes (Signed)
 Pt with primary diagnoses of NSTEMI complained of feeling tightness in her chest that she stated has been continuous for a while before coming to the hospital. She stated she was able to still carry out her ADLs with it not interfering with them at home. Nitroglycerin drip was stopped at 1410 due to soft diastolic BP and HR in 50s. MD made aware, with orders to continue to monitor and notify if condition worsened.

## 2023-12-13 NOTE — Plan of Care (Signed)

## 2023-12-13 NOTE — ED Notes (Signed)
 1330 BP inaccurate. Cuff has moved and echo also in room at the time.

## 2023-12-13 NOTE — ED Provider Notes (Signed)
 [facility] EMERGENCY DEPARTMENT AT [facility] Provider Note   CSN: [id number] Arrival date & time: 12/13/23  7829     History {Add pertinent medical, surgical, social history, OB history to HPI:1} Chief Complaint  Patient presents with   Nausea    Linda Golden is a 89 y.o. female.  89 year old female with past medical history of hypertension presenting to the emergency department today with chest pressure and nausea.  The patient states this began prior to arrival.  She states she had finished cooking breakfast but did not have much of an appetite.  She shortly developed the chest pressure after.  She states this is over her entire chest.  She did have some nausea and 1 episode of nonbloody, nonbilious emesis.  She denies any abdominal pain with this.  Reports normal bowel movements.  She came to the ER today for further evaluation regarding this.        Home Medications Prior to Admission medications   Medication Sig Start Date End Date Taking? Authorizing Provider  amLODipine (NORVASC) 2.5 MG tablet TAKE (1) TABLET BY MOUTH ONCE DAILY. 07/27/23   Shelva Majestic, MD  fluticasone (FLONASE) 50 MCG/ACT nasal spray Place 1 spray into both nostrils daily. 02/16/23   Raspet, Denny Peon K, PA-C  hydrochlorothiazide (HYDRODIURIL) 25 MG tablet TAKE 1 TABLET BY MOUTH DAILY WITH LOSARTAN. 07/27/23   Shelva Majestic, MD  losartan (COZAAR) 100 MG tablet TAKE 1 TABLET BY MOUTH DAILY WITH HYDROCHLOROTHIAZIDE. 07/27/23   Shelva Majestic, MD      Allergies    Lisinopril, Statins, and Sulfa antibiotics    Review of Systems   Review of Systems  Respiratory:  Positive for chest tightness.   Gastrointestinal:  Positive for nausea and vomiting.  All other systems reviewed and are negative.   Physical Exam Updated Vital Signs BP (!) 160/85   Pulse 64   Temp (!) 97.5 F (36.4 C) (Oral)   Resp 14   SpO2 97%  Physical Exam Vitals and nursing note reviewed.   Gen: NAD Eyes:  PERRL, EOMI HEENT: no oropharyngeal swelling Neck: trachea midline Resp: clear to auscultation bilaterally Card: RRR, no murmurs, rubs, or gallops Abd: nontender, nondistended Extremities: no calf tenderness, no edema Vascular: 2+ radial pulses bilaterally, 2+ DP pulses bilaterally Skin: no rashes Psyc: acting appropriately   ED Results / Procedures / Treatments   Labs (all labs ordered are listed, but only abnormal results are displayed) Labs Reviewed  BASIC METABOLIC PANEL  CBC  URINALYSIS, ROUTINE W REFLEX MICROSCOPIC  TROPONIN I (HIGH SENSITIVITY)    EKG None  Radiology No results found.  Procedures Procedures  {Document cardiac monitor, telemetry assessment procedure when appropriate:1}  Medications Ordered in ED Medications  ondansetron (ZOFRAN) injection 4 mg (has no administration in time range)    ED Course/ Medical Decision Making/ A&P   {   Click here for ABCD2, HEART and other calculatorsREFRESH Note before signing :1}                              Medical Decision Making 89 year old female with past medical history of hypertension presenting to the emergency department today with chest pressure, nausea, and vomiting.  I will further evaluate the patient here with basic labs Wels and EKG, chest x-ray, troponin for further evaluation for ACS, pulmonary edema, pulmonary infiltrates, or pneumothorax.  Will also obtain LFTs and a lipase to evaluate for  hepatobiliary pathology or pancreatitis.  The patient denies any abdominal pain and does not have any tenderness here on exam.  I will give the patient Zofran for symptoms.  She also reports that she was concern for possible urinary tract infection and had a urinalysis performed with her primary care provider and has not had the results yet.  I will further evaluate regarding this with a repeat here.  The patient's initial EKG interpreted by me shows a sinus rhythm with a rate of 59 with normal axis, normal  intervals, and nonspecific ST-T changes.  The machine read is for some ST elevation in the inferior leads.  It does appear that there is some artifact in this region.  I will have a repeat EKG collected.  This is not a definitive STEMI at this time. 10:03 AM  Amount and/or Complexity of Data Reviewed Labs: ordered. Radiology: ordered.  Risk Prescription drug management.   ***  {Document critical care time when appropriate:1} {Document review of labs and clinical decision tools ie heart score, Chads2Vasc2 etc:1}  {Document your independent review of radiology images, and any outside records:1} {Document your discussion with family members, caretakers, and with consultants:1} {Document social determinants of health affecting pt's care:1} {Document your decision making why or why not admission, treatments were needed:1} Final Clinical Impression(s) / ED Diagnoses Final diagnoses:  None    Rx / DC Orders ED Discharge Orders     None

## 2023-12-13 NOTE — H&P (Signed)
 History and Physical    Patient: Linda Golden:[id number] DOB: August 01, 1927 DOA: 12/13/2023 DOS: the patient was seen and examined on 12/13/2023 PCP: Shelva Majestic, MD  Patient coming from: Home  Chief Complaint:  Chief Complaint  Patient presents with   Nausea   HPI: Linda Golden is a 89 y.o. female with medical history significant of type 2 diabetes (diet-controlled), hypertension, hyperlipidemia and GERD; who presented to the hospital secondary to chest pressure and associated nausea.  Patient reports waking up with mentioned symptoms and given failure to go away contacted fire department/EMS in order to get her vital sign check.  Patient was transported to the emergency department for further evaluation and management.    Patient denies intermittent previous episodes of nausea and emesis; no vomiting reported at this time.  Patient is afebrile, no sick contacts that she is aware of, no dysuria, no hematuria, no abdominal pain, no melena or hematochezia.  Recent workup as an outpatient with PCP demonstrated elevated LDL and positive Hemoccult test; but a stable hemoglobin.  Workup in the ED demonstrating EKG with sinus rhythm and and ST segment changes suggestive of inferoposterior injury; high sensitive troponin elevated at 671.  Chest x-ray without acute cardiopulmonary process.  Cardiology service contacted and [facility] consulted to place patient in the hospital for further evaluation and management.  Patient has been started on heparin drip and nitroglycerin drip.  Review of Systems: As mentioned in the history of present illness. All other systems reviewed and are negative. 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 She has never used smokeless tobacco. She reports that she does not drink alcohol and does not use drugs.  Allergies  Allergen Reactions   Lisinopril Cough   Statins Other (See Comments)    Hair loss   Sulfa Antibiotics Other (See Comments)    No reaction-patient states her family member was "burned" from this medication    Family History  Problem Relation Age of Onset   Hypertension Father    Alzheimer's disease Mother     Prior to Admission medications   Medication Sig Start Date End Date Taking? Authorizing Provider  amLODipine (NORVASC) 2.5 MG tablet TAKE (1) TABLET BY MOUTH ONCE DAILY. 07/27/23  Yes Shelva Majestic, MD  fluticasone (FLONASE) 50 MCG/ACT nasal spray Place 1 spray into both nostrils daily. 02/16/23  Yes Raspet, Erin K, PA-C  hydrochlorothiazide (HYDRODIURIL) 25 MG tablet TAKE 1 TABLET BY MOUTH DAILY WITH LOSARTAN. 07/27/23  Yes Shelva Majestic, MD  losartan (COZAAR) 100 MG tablet TAKE 1 TABLET BY MOUTH DAILY WITH HYDROCHLOROTHIAZIDE. 07/27/23  Yes Shelva Majestic, MD    Physical Exam: Vitals:   12/13/23 1004 12/13/23 1030  BP: (!) 160/85 (!) 158/61  Pulse: 64 (!) 56  Resp: 14   Temp: (!) 97.5 F (36.4 C)   TempSrc: Oral   SpO2: 97% 99%   General exam: Alert, awake, oriented x 3; hard of hearing and in no acute distress.  Reports an improvement in her symptoms  after receiving nitroglycerin drip. Respiratory system: Good saturation on room air. Cardiovascular system: Sinus bradycardia, no rubs, no gallops, no JVD. Gastrointestinal system: Abdomen is nondistended, soft and nontender. No organomegaly or masses felt. Normal bowel sounds heard. Central nervous system: Moving 4 limbs spontaneously.  No focal neurological deficits. Extremities: No cyanosis or clubbing. Skin: No petechiae. Psychiatry: Mood & affect appropriate.   Data Reviewed: High sensitive troponin:  671  >>  674 CBC: WBCs 10.0, hemoglobin 13.5 and platelet count 2 20K Comprehensive metabolic panel: Sodium 138, potassium 3.4, chloride 100, bicarb 26, BUN 28, creatinine 1.16 and GFR 44. CXR: No acute cardiopulmonary process appreciated.  Osteopenia and chronic emphysematous changes in her upper lungs appreciated.  Assessment and Plan: 1-NSTEMI -Heart score of 4 to 5 -Heparin drip and nitroglycerin drip has been started -No beta-blocker given appreciated bradycardia -Aspirin, statin, ARB and oxygen supplementation provided -2D echo has been ordered cardiology service consulted; will follow further recommendations.  2-type 2 diabetes -Diet control as an outpatient -Recent A1c 6.3 -Sliding scale insulin initiated while inpatient.  3-hyperlipidemia -Per chart review sounds intolerance/side effect appreciated in the past -Statin acutely provided in the setting of ACS -Follow-up lipoprotein A results -Appreciate assistance and further recommendation by cardiology service.  4-hypertension -Continue treatment with Norvasc and losartan  5-GERD -Continue PPI.    Advance Care Planning:   Code Status: Full Code   Consults: Cardiology service  Family Communication: Son and granddaughter at bedside.  Severity of Illness: The appropriate patient status for this patient is OBSERVATION. Observation status is judged to be reasonable and necessary in order to provide the required intensity of service to ensure the patient's safety. The patient's presenting symptoms, physical exam findings, and initial radiographic and laboratory data in the context of their medical condition is felt to place them at decreased risk for further clinical deterioration. Furthermore, it is anticipated that the patient will be medically stable for discharge from the hospital within 2 midnights of admission.   Author: Vassie Loll, MD 12/13/2023 11:59 AM  For on call review [email or web address].

## 2023-12-13 NOTE — ED Triage Notes (Signed)
 Pt arrived via [facility] from home, c/o N/V once, started today "all of a sudden" and she called the fire dept to get her vitals. Fire dept called ems due to N/V and pt stating he chest was cold. Upon arrival to ED, pt does endorse "chest coldness and heaviness" but denies CP. Denies SHOB, denies dizziness. A&Ox 4, stated she is ambulatory without any sort of assistive devices and denies any recent falls

## 2023-12-13 NOTE — Consult Note (Signed)
 Cardiology Consultation:   Patient ID: Linda Golden; [id number]; 04/17/1927   Admit date: 12/13/2023 Date of Consult: 12/13/2023  Primary Care Provider: Shelva Majestic, MD Primary Cardiologist: New to [facility]  History of Present Illness:   Linda Golden is a 89 y.o. female with past medical history outlined below, now presenting to the [facility] ER reporting onset of chest pressure and nausea this morning.  She states that she worked out in her yard most of the day yesterday without specific exertional symptoms.  She called the fire department/EMS and was transported for further evaluation.  She has had some nausea and emesis, presently resolved.  Still indicates feeling "strange" in her chest.  She has no prior history of obstructive CAD or myocardial infarction, although coronary artery calcification evident by prior CT imaging in 2016.  Reportedly had prior hair loss on statin therapy and did not want to start Zetia per review of recent PCP office note.  She does have a history of severe hyperlipidemia with LDL 225 as of August 2024.  ECGs reviewed showing sinus rhythm with ST segment changes suggestive of inferoposterior injury current although not diagnostic of STEMI.  Most recent tracing shows relative improvement in ST segment change.  She is not yet on IV heparin or IV nitroglycerin.  Hemodynamically stable, hypertensive with systolic in the 140s to 150s.  Initial high-sensitivity troponin I elevated at 671.  Chest x-ray pending.  I note that she had a visit with her PCP recently on February 17.  She did undergo recent lab work and also had 2 stool cards which were found to be Hemoccult positive.  She has had some unexplained weight loss since July of last year, does not report any obvious melena or hematochezia.  Present hemoglobin is normal.  ROS:  Pertinent review in history of present illness.  Hearing loss.  No palpitations or syncope.  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   Current Outpatient Medications on File Prior to Encounter  Medication Sig Dispense Refill   amLODipine (NORVASC) 2.5 MG tablet TAKE (1) TABLET BY MOUTH ONCE DAILY. 90 tablet 3   fluticasone (FLONASE) 50 MCG/ACT nasal spray Place 1 spray into both nostrils daily. 16 g 0   hydrochlorothiazide (HYDRODIURIL) 25 MG tablet TAKE 1 TABLET BY MOUTH DAILY WITH LOSARTAN. 90 tablet 3   losartan (COZAAR) 100 MG tablet TAKE 1 TABLET BY MOUTH DAILY WITH HYDROCHLOROTHIAZIDE. 90 tablet 3     Allergies:    Allergies  Allergen Reactions   Lisinopril Cough   Statins Other (See Comments)    Hair loss   Sulfa Antibiotics Other (See Comments)    No reaction-patient states her family member was "burned" from this medication    Social History:   Social History   Tobacco Use   Smoking status: Never   Smokeless tobacco: Never  Substance Use Topics   Alcohol use: No    Family History:   The patient's family history includes  Alzheimer's disease in her mother; Hypertension in her father.  Physical Exam/Data:   Vitals:   12/13/23 1004 12/13/23 1030  BP: (!) 160/85 (!) 158/61  Pulse: 64 (!) 56  Resp: 14   Temp: (!) 97.5 F (36.4 C)   TempSrc: Oral   SpO2: 97% 99%   No intake or output data in the 24 hours ending 12/13/23 1141 There were no vitals filed for this visit. There is no height or weight on file to calculate BMI.   Gen: Elderly woman in no acute distress. HEENT: Conjunctiva and lids normal, oropharynx clear. Neck: Supple, no elevated JVP or carotid bruits. Lungs:  Clear to auscultation, nonlabored breathing at rest. Cardiac: Regular rate and rhythm, no S3, soft systolic murmur, no pericardial rub. Abdomen: Soft, nontender, bowel sounds present. Extremities: No pitting edema, distal pulses 2+. Skin: Warm and dry. Musculoskeletal: No kyphosis. Neuropsychiatric: Alert and oriented x3, affect grossly appropriate.  Telemetry:  I personally reviewed telemetry which shows sinus bradycardia, occasional PVCs.  Relevant CV Studies:  No prior cardiac testing for review.  Laboratory Data:  Chemistry Recent Labs  Lab 12/13/23 1015  NA 138  K 3.4*  CL 100  CO2 26  GLUCOSE 155*  BUN 28*  CREATININE 1.16*  CALCIUM 9.9  GFRNONAA 44*  ANIONGAP 12    Recent Labs  Lab 12/13/23 1015  PROT 7.5  ALBUMIN 4.2  AST 25  ALT 14  ALKPHOS 62  BILITOT 0.9   Hematology Recent Labs  Lab 12/13/23 1015  WBC 10.0  RBC 4.61  HGB 13.5  HCT 41.4  MCV 89.8  MCH 29.3  MCHC 32.6  RDW 12.4  PLT 220   Cardiac Enzymes Recent Labs  Lab 12/13/23 1015  TROPONINIHS 671*    Lipid Panel     Component Value Date/Time   CHOL 265 (H) 05/30/2023 1045   TRIG 311.0 (H) 05/30/2023 1045   HDL 43.20 05/30/2023 1045   CHOLHDL 6 05/30/2023 1045   VLDL 62.2 (H) 05/30/2023 1045   LDLCALC 158 (H) 05/19/2022 1532   LDLDIRECT 225.0 05/30/2023 1045    Radiology/Studies:  No results found.  Assessment and Plan:   1.  NSTEMI with evidence of inferoposterior injury current although not diagnostic for STEMI, onset of symptoms earlier this morning consistent with ACS.  Patient is 89 years old, however still lives in her own home and reportedly functional with ADLs including yard work.  Initial high-sensitivity troponin I 671.  She has a history of severe hyperlipidemia with LDL 225 in August 2024, prior statin intolerance indicated per chart review.  2.  Recent Hemoccult positive stool cards x 2.  These were obtained per PCP visit recently due to unexplained weight  loss, patient does not report any obvious melena or hematochezia.  Her hemoglobin is normal at 13.5.  3.  CKD stage IIIb, creatinine 1.16 and GFR 44 (46 one year ago).  4.  Primary hypertension. On Norvasc, Cozaar, and HCTZ as an outpatient.  5.  Severe hyperlipidemia with prior history of statin intolerance (hair loss).  Chart review indicates reluctance to take Zetia or PCSK9 inhibitors.  LDL 225 in August 2024.  Situation discussed with the patient and two granddaughters present.  Although cardiac catheterization to assess for revascularization options is not completely out of the question (she sounds to be fairly functional at age 52), I do have concerns about recent heme positive stool cards obtained by PCP in the setting of unexplained weight loss, she also has other  risks including CKD stage IIIb and severe hyperlipidemia with reluctance to take medications.  Plan at this time is for her to be admitted to the hospitalist service.  Starting IV heparin and IV nitroglycerin, would hold off beta-blocker given heart rate in the 50s to 60s.  Hold HCTZ, can likely resume Norvasc and potentially Cozaar depending on blood pressure and renal function.  Start aspirin 81 mg daily and for now also start statin therapy at least in the short-term in the setting of ACS.  Obtain echocardiogram.  If she shows downward trend in hemoglobin or obvious bleeding on heparin, this would argue fairly strongly against pursuing PCI as this would commit her to uninterrupted DAPT.  For questions or updates, please contact [facility] Please consult [email or web address] for contact info under   Signed, Nona Dell, MD  12/13/2023 11:41 AM

## 2023-12-14 DIAGNOSIS — I2510 Atherosclerotic heart disease of native coronary artery without angina pectoris: Secondary | ICD-10-CM | POA: Diagnosis not present

## 2023-12-14 DIAGNOSIS — Z79899 Other long term (current) drug therapy: Secondary | ICD-10-CM | POA: Diagnosis not present

## 2023-12-14 DIAGNOSIS — Z6821 Body mass index (BMI) 21.0-21.9, adult: Secondary | ICD-10-CM | POA: Diagnosis not present

## 2023-12-14 DIAGNOSIS — N1832 Chronic kidney disease, stage 3b: Secondary | ICD-10-CM | POA: Diagnosis not present

## 2023-12-14 DIAGNOSIS — H9190 Unspecified hearing loss, unspecified ear: Secondary | ICD-10-CM | POA: Diagnosis not present

## 2023-12-14 DIAGNOSIS — N179 Acute kidney failure, unspecified: Secondary | ICD-10-CM | POA: Diagnosis not present

## 2023-12-14 DIAGNOSIS — D631 Anemia in chronic kidney disease: Secondary | ICD-10-CM | POA: Diagnosis not present

## 2023-12-14 DIAGNOSIS — E1122 Type 2 diabetes mellitus with diabetic chronic kidney disease: Secondary | ICD-10-CM | POA: Diagnosis not present

## 2023-12-14 DIAGNOSIS — Z820 Family history of epilepsy and other diseases of the nervous system: Secondary | ICD-10-CM | POA: Diagnosis not present

## 2023-12-14 DIAGNOSIS — I129 Hypertensive chronic kidney disease with stage 1 through stage 4 chronic kidney disease, or unspecified chronic kidney disease: Secondary | ICD-10-CM | POA: Diagnosis not present

## 2023-12-14 DIAGNOSIS — Z7982 Long term (current) use of aspirin: Secondary | ICD-10-CM | POA: Diagnosis not present

## 2023-12-14 DIAGNOSIS — R634 Abnormal weight loss: Secondary | ICD-10-CM | POA: Diagnosis not present

## 2023-12-14 DIAGNOSIS — Z8249 Family history of ischemic heart disease and other diseases of the circulatory system: Secondary | ICD-10-CM | POA: Diagnosis not present

## 2023-12-14 DIAGNOSIS — I214 Non-ST elevation (NSTEMI) myocardial infarction: Secondary | ICD-10-CM | POA: Diagnosis not present

## 2023-12-14 DIAGNOSIS — K219 Gastro-esophageal reflux disease without esophagitis: Secondary | ICD-10-CM | POA: Diagnosis not present

## 2023-12-14 DIAGNOSIS — E876 Hypokalemia: Secondary | ICD-10-CM | POA: Diagnosis not present

## 2023-12-14 DIAGNOSIS — E785 Hyperlipidemia, unspecified: Secondary | ICD-10-CM | POA: Diagnosis not present

## 2023-12-14 DIAGNOSIS — Z888 Allergy status to other drugs, medicaments and biological substances status: Secondary | ICD-10-CM | POA: Diagnosis not present

## 2023-12-14 DIAGNOSIS — Z882 Allergy status to sulfonamides status: Secondary | ICD-10-CM | POA: Diagnosis not present

## 2023-12-14 DIAGNOSIS — R001 Bradycardia, unspecified: Secondary | ICD-10-CM | POA: Diagnosis not present

## 2023-12-14 LAB — BASIC METABOLIC PANEL
Anion gap: 6 (ref 5–15)
BUN: 30 mg/dL — ABNORMAL HIGH (ref 8–23)
CO2: 26 mmol/L (ref 22–32)
Calcium: 8.9 mg/dL (ref 8.9–10.3)
Chloride: 106 mmol/L (ref 98–111)
Creatinine, Ser: 1.35 mg/dL — ABNORMAL HIGH (ref 0.44–1.00)
GFR, Estimated: 36 mL/min — ABNORMAL LOW (ref >60)
Glucose, Bld: 126 mg/dL — ABNORMAL HIGH (ref 70–99)
Potassium: 4.7 mmol/L (ref 3.5–5.1)
Sodium: 138 mmol/L (ref 135–145)

## 2023-12-14 LAB — CBC
HCT: 35.5 % — ABNORMAL LOW (ref 36.0–46.0)
Hemoglobin: 11.1 g/dL — ABNORMAL LOW (ref 12.0–15.0)
MCH: 28.8 pg (ref 26.0–34.0)
MCHC: 31.3 g/dL (ref 30.0–36.0)
MCV: 92.2 fL (ref 80.0–100.0)
Platelets: 195 10*3/uL (ref 150–400)
RBC: 3.85 MIL/uL — ABNORMAL LOW (ref 3.87–5.11)
RDW: 12.5 % (ref 11.5–15.5)
WBC: 11.2 10*3/uL — ABNORMAL HIGH (ref 4.0–10.5)
nRBC: 0 % (ref 0.0–0.2)

## 2023-12-14 LAB — GLUCOSE, CAPILLARY
Glucose-Capillary: 119 mg/dL — ABNORMAL HIGH (ref 70–99)
Glucose-Capillary: 121 mg/dL — ABNORMAL HIGH (ref 70–99)
Glucose-Capillary: 143 mg/dL — ABNORMAL HIGH (ref 70–99)
Glucose-Capillary: 135 mg/dL — ABNORMAL HIGH (ref 70–99)

## 2023-12-14 LAB — HEPARIN LEVEL (UNFRACTIONATED)
Heparin Unfractionated: 0.33 [IU]/mL (ref 0.30–0.70)
Heparin Unfractionated: 0.35 [IU]/mL (ref 0.30–0.70)

## 2023-12-14 NOTE — Progress Notes (Signed)
 PHARMACY - ANTICOAGULATION CONSULT NOTE  Pharmacy Consult for heparin Indication: chest pain/ACS  Allergies  Allergen Reactions   Lisinopril Cough   Statins Other (See Comments)    Hair loss   Sulfa Antibiotics Other (See Comments)    No reaction-patient states her family member was "burned" from this medication    Patient Measurements: Height: 5\' 3"  (160 cm) Weight: 55.8 kg (123 lb 0.3 oz) IBW/kg (Calculated) : 52.4 Heparin Dosing Weight: 50 kg  Vital Signs: Temp: 98.2 F (36.8 C) (02/28 0750) Temp Source: Oral (02/28 0750) BP: 119/47 (02/28 0800) Pulse Rate: 56 (02/28 0800)  Labs: Recent Labs    12/13/23 1015 12/13/23 1210 12/13/23 1918 12/14/23 0503 12/14/23 1107  HGB 13.5  --   --  11.1*  --   HCT 41.4  --   --  35.5*  --   PLT 220  --   --  195  --   HEPARINUNFRC  --   --  0.28* 0.33 0.35  CREATININE 1.16*  --   --  1.35*  --   TROPONINIHS 671* 674*  --   --   --     Estimated Creatinine Clearance: 21.1 mL/min (A) (by C-G formula based on SCr of 1.35 mg/dL (H)).   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 90 tablet 3 12/12/2023 Morning   fluticasone (FLONASE) 50 MCG/ACT nasal spray Place 1 spray into both nostrils daily. 16 g 0 Taking   hydrochlorothiazide (HYDRODIURIL) 25 MG tablet TAKE 1 TABLET BY MOUTH DAILY WITH LOSARTAN. 90 tablet 3 12/12/2023 Morning   losartan (COZAAR) 100 MG tablet TAKE 1 TABLET BY MOUTH DAILY WITH HYDROCHLOROTHIAZIDE. 90 tablet 3 12/12/2023 Morning    Assessment: Pharmacy consulted to dose heparin in patient with chest pain/ACS.  Patient is not on anticoagulation prior to  admission. Cardiology saw patient and does not plan on cath lab for patient at this time due to recent outpatient stools heme(+) and AKI. They wish to continue heparin for 48 hours.   CBC WNL Trop 671  Goal of Therapy:  Heparin level 0.3-0.7 units/ml Monitor platelets by anticoagulation protocol: Yes  Date Time HL Rate/Comment  2/27 1918 0.28 Subtherapeutic 2/28 0503 0.33 Therapeutic x 1 2/28 1107 0.35 Therapeutic x 2   Plan:  Continue heparin infusion at 700 units/hr 48 hours of heparin will hit 3/1 @ ~1200 Check heparin level with AM labs tomorrow CBC daily while on heparin drip  Thank you for involving pharmacy in this patient's care.   Will M. Dareen Piano, PharmD Clinical Pharmacist 12/14/2023 12:27 PM

## 2023-12-14 NOTE — Progress Notes (Addendum)
 Rounding Note    Patient Name: TIERRA THOMA Date of Encounter: 12/14/2023  [facility] Cardiologist: New  Subjective   Chest pain has resolved  Inpatient Medications    Scheduled Meds:  amLODipine  5 mg Oral Daily   aspirin EC  81 mg Oral Daily   atorvastatin  40 mg Oral Daily   Chlorhexidine Gluconate Cloth  6 each Topical Q0600   insulin aspart  0-9 Units Subcutaneous TID WC   losartan  25 mg Oral Daily   pantoprazole  40 mg Oral Daily   sodium chloride flush  3 mL Intravenous Q12H   Continuous Infusions:  sodium chloride     heparin 700 Units/hr (12/13/23 1943)   nitroGLYCERIN Stopped (12/13/23 1413)   PRN Meds: sodium chloride, acetaminophen, ondansetron (ZOFRAN) IV, sodium chloride flush   Vital Signs    Vitals:   12/14/23 0615 12/14/23 0700 12/14/23 0750 12/14/23 0800  BP: (!) 109/31 (!) 108/24  (!) 119/47  Pulse: (!) 46 (!) 48  (!) 56  Resp: 14 18  20   Temp:   98.2 F (36.8 C)   TempSrc:   Oral   SpO2: 95% 96%  97%  Weight:      Height:        Intake/Output Summary (Last 24 hours) at 12/14/2023 0850 Last data filed at 12/13/2023 2045 Gross per 24 hour  Intake 69.87 ml  Output --  Net 69.87 ml      12/14/2023    5:00 AM 12/13/2023    3:10 PM 12/03/2023   11:00 AM  Last 3 Weights  Weight (lbs) 123 lb 0.3 oz 118 lb 6.2 oz 110 lb 12.8 oz  Weight (kg) 55.8 kg 53.7 kg 50.259 kg      Telemetry    SR and sinus brady - Personally Reviewed  ECG    N/a - Personally Reviewed  Physical Exam   GEN: No acute distress.   Neck: No JVD Cardiac: RRR, no murmurs, rubs, or gallops.  Respiratory: Clear to auscultation bilaterally. GI: Soft, nontender, non-distended  MS: No edema; No deformity. Neuro:  Nonfocal  Psych: Normal affect   Labs    High Sensitivity Troponin:   Recent Labs  Lab 12/13/23 1015 12/13/23 1210  TROPONINIHS 671* 674*     Chemistry Recent Labs  Lab 12/13/23 1015 12/14/23 0503  NA 138 138  K 3.4* 4.7  CL  100 106  CO2 26 26  GLUCOSE 155* 126*  BUN 28* 30*  CREATININE 1.16* 1.35*  CALCIUM 9.9 8.9  PROT 7.5  --   ALBUMIN 4.2  --   AST 25  --   ALT 14  --   ALKPHOS 62  --   BILITOT 0.9  --   GFRNONAA 44* 36*  ANIONGAP 12 6    Lipids No results for input(s): "CHOL", "TRIG", "HDL", "LABVLDL", "LDLCALC", "CHOLHDL" in the last 168 hours.  Hematology Recent Labs  Lab 12/13/23 1015 12/14/23 0503  WBC 10.0 11.2*  RBC 4.61 3.85*  HGB 13.5 11.1*  HCT 41.4 35.5*  MCV 89.8 92.2  MCH 29.3 28.8  MCHC 32.6 31.3  RDW 12.4 12.5  PLT 220 195   Thyroid No results for input(s): "TSH", "FREET4" in the last 168 hours.  BNPNo results for input(s): "BNP", "PROBNP" in the last 168 hours.  DDimer No results for input(s): "DDIMER" in the last 168 hours.   Radiology    ECHOCARDIOGRAM COMPLETE Result Date: 12/13/2023    ECHOCARDIOGRAM REPORT  Patient Name:   CHASTIN GARLITZ Date of Exam: 12/13/2023 Medical Rec #:  [id number]     Height:       63.0 in Accession #:    [id number]    Weight:       110.8 lb Date of Birth:  Jul 07, 1927      BSA:          1.504 m Patient Age:    89 years      BP:           113/38 mmHg Patient Gender: F             HR:           56 bpm. [facility] Procedure: 2D Echo, Cardiac Doppler and Color Doppler (Both Spectral and Color            Flow Doppler were utilized during procedure). Indications:    NSTEMI I21.4  History:        Patient has no prior history of Echocardiogram examinations.                 Acute MI; Risk Factors:Diabetes and Hypertension.  Sonographer:    Webb Laws Referring Phys: [id number] CARLOS MADERA IMPRESSIONS  1. Left ventricular ejection fraction, by estimation, is 60 to 65%. The left ventricle has normal function. The left ventricle demonstrates regional wall motion abnormalities (see scoring diagram/findings for description). There is severe asymmetric left ventricular hypertrophy of the basal segment. Left ventricular diastolic parameters are  consistent with Grade I diastolic dysfunction (impaired relaxation).  2. Right ventricular systolic function is normal. The right ventricular size is normal. There is normal pulmonary artery systolic pressure. The estimated right ventricular systolic pressure is 28.4 mmHg.  3. Left atrial size was upper normal.  4. The mitral valve is degenerative. Trivial mitral valve regurgitation.  5. The aortic valve is tricuspid. There is moderate calcification of the aortic valve with restricted motion of the noncoronary cusp. Aortic valve regurgitation is not visualized.  6. The inferior vena cava is normal in size with greater than 50% respiratory variability, suggesting right atrial pressure of 3 mmHg. Comparison(s): No prior Echocardiogram. FINDINGS  Left Ventricle: Left ventricular ejection fraction, by estimation, is 60 to 65%. The left ventricle has normal function. The left ventricle demonstrates regional wall motion abnormalities. Strain imaging was not performed. The left ventricular internal cavity size was normal in size. There is severe asymmetric left ventricular hypertrophy of the basal segment. Left ventricular diastolic parameters are consistent with Grade I diastolic dysfunction (impaired relaxation).  LV Wall Scoring: The basal inferior segment and basal inferoseptal segment are hypokinetic. The entire anterior wall, entire lateral wall, entire anterior septum, entire apex, mid and distal inferior wall, and mid inferoseptal segment are normal. Right Ventricle: The right ventricular size is normal. No increase in right ventricular wall thickness. Right ventricular systolic function is normal. There is normal pulmonary artery systolic pressure. The tricuspid regurgitant velocity is 2.52 m/s, and  with an assumed right atrial pressure of 3 mmHg, the estimated right ventricular systolic pressure is 28.4 mmHg. Left Atrium: Left atrial size was upper normal. Right Atrium: Right atrial size was normal in size.  Pericardium: There is no evidence of pericardial effusion. Presence of epicardial fat layer. Mitral Valve: The mitral valve is degenerative in appearance. There is mild calcification of the mitral valve leaflet(s). Mild mitral annular calcification. Trivial mitral valve regurgitation. Tricuspid Valve: The tricuspid valve is grossly normal. Tricuspid valve  regurgitation is mild. Aortic Valve: The aortic valve is tricuspid. There is moderate calcification of the aortic valve. There is mild aortic valve annular calcification. Aortic valve regurgitation is not visualized. Pulmonic Valve: The pulmonic valve was not well visualized. Pulmonic valve regurgitation is trivial. Aorta: The aortic root and ascending aorta are structurally normal, with no evidence of dilitation. Venous: The inferior vena cava is normal in size with greater than 50% respiratory variability, suggesting right atrial pressure of 3 mmHg. IAS/Shunts: The interatrial septum was not well visualized. Additional Comments: 3D imaging was not performed.  LEFT VENTRICLE PLAX 2D LVIDd:         2.10 cm   Diastology LVIDs:         1.60 cm   LV e' medial:    4.03 cm/s LV PW:         1.50 cm   LV E/e' medial:  24.1 LV IVS:        1.90 cm   LV e' lateral:   7.29 cm/s LVOT diam:     1.80 cm   LV E/e' lateral: 13.3 LV SV:         61 LV SV Index:   40 LVOT Area:     2.54 cm  RIGHT VENTRICLE RV Basal diam:  2.90 cm RV S prime:     16.00 cm/s TAPSE (M-mode): 2.1 cm LEFT ATRIUM             Index        RIGHT ATRIUM          Index LA diam:        4.30 cm 2.86 cm/m   RA Area:     9.91 cm LA Vol (A2C):   49.3 ml 32.77 ml/m  RA Volume:   15.40 ml 10.24 ml/m LA Vol (A4C):   42.3 ml 28.12 ml/m LA Biplane Vol: 45.8 ml 30.44 ml/m  AORTIC VALVE LVOT Vmax:   95.10 cm/s LVOT Vmean:  70.400 cm/s LVOT VTI:    0.239 m  AORTA Ao Root diam: 2.60 cm Ao Asc diam:  3.30 cm MITRAL VALVE                TRICUSPID VALVE MV Area (PHT): 2.54 cm     TR Peak grad:   25.4 mmHg MV Decel  Time: 299 msec     TR Vmax:        252.00 cm/s MV E velocity: 97.30 cm/s MV A velocity: 134.00 cm/s  SHUNTS MV E/A ratio:  0.73         Systemic VTI:  0.24 m                             Systemic Diam: 1.80 cm Nona Dell MD Electronically signed by Nona Dell MD Signature Date/Time: 12/13/2023/2:37:43 PM    Final    DG Chest Port 1 View Result Date: 12/13/2023 CLINICAL DATA:  Fever and chills. EXAM: PORTABLE CHEST 1 VIEW COMPARISON:  Chest radiograph dated 12/03/2023. FINDINGS: Stable mild cardiomegaly. Aortic atherosclerosis. Emphysematous changes in the upper lungs. No focal consolidation, sizeable pleural effusion, or pneumothorax. Osteopenia. No acute osseous abnormality. IMPRESSION: No acute cardiopulmonary findings.  Emphysema. Electronically Signed   By: Hart Robinsons M.D.   On: 12/13/2023 13:43    Cardiac Studies    Patient Profile     89 y.o. female history of HTN, HLD admitted with NSTEMI  Assessment & Plan  1.NSTEMI - presented with chest pain - trop up to 674 without established peak. EKG subtle lateral ST depression - echo LVEF 60-65%, grade I dd, normal RV function, basal inferior and inferoseptal walls are hyokinetic.   -medical therapy with ASA 81, atorva 40, hep gtt, losartan 25mg . No beta blocker due to mild sinus bradycardia.  - prior statin intolerance listed but retrial this admission  - advanced age but independent in ADLs, still drives and mows her yard.  - concern about Hgb 13-->11 overnight on hep gtt and recent outpatient stool cards + for blood.  - also AKI today Cr 1.1 up to 1.3, based on advanced age this correlates to GFR of 22 - she is pain free, LV function intact despite focal WMA - continue medical therapy at this time, follow Hgb, renal function, symptoms. I think unlikely she would be a cath candidate, and would continue medical therapy at this time with 48 hrs of heparin.  - with drop in Hgb would not add on plavix for medically managed  NSTEMI at this time.   2. Anemia - heme + stool cards - Hgb 13-->11 this admission on hep gtt. Has had unexplained weight loss 10 lbs, being evaluated as outpatient.        For questions or updates, please contact [facility] Please consult [email or web address] for contact info under        Signed, Dina Rich, MD  12/14/2023, 8:50 AM

## 2023-12-14 NOTE — Plan of Care (Signed)
  Problem: Education: Goal: Knowledge of General Education information will improve Description: Including pain rating scale, medication(s)/side effects and non-pharmacologic comfort measures Outcome: Progressing   Problem: Nutrition: Goal: Adequate nutrition will be maintained Outcome: Progressing   Problem: Coping: Goal: Level of anxiety will decrease Outcome: Progressing   Problem: Coping: Goal: Ability to adjust to condition or change in health will improve Outcome: Progressing

## 2023-12-14 NOTE — Progress Notes (Signed)
 Progress Note   Patient: Linda Golden WJX:[id number] DOB: [birth year] DOA: 12/13/2023     0 DOS: the patient was seen and examined on 12/14/2023   Brief hospital admission narrative: Linda Golden is a 89 y.o. female with medical history significant of type 2 diabetes (diet-controlled), hypertension, hyperlipidemia and GERD; who presented to the hospital secondary to chest pressure and associated nausea.  Patient reports waking up with mentioned symptoms and given failure to go away contacted fire department/EMS in order to get her vital sign check.  Patient was transported to the emergency department for further evaluation and management.     Patient denies intermittent previous episodes of nausea and emesis; no vomiting reported at this time.  Patient is afebrile, no sick contacts that she is aware of, no dysuria, no hematuria, no abdominal pain, no melena or hematochezia.  Recent workup as an outpatient with PCP demonstrated elevated LDL and positive Hemoccult test; but a stable hemoglobin.   Workup in the ED demonstrating EKG with sinus rhythm and and ST segment changes suggestive of inferoposterior injury; high sensitive troponin elevated at 671.  Chest x-ray without acute cardiopulmonary process.  Cardiology service contacted and [facility] consulted to place patient in the hospital for further evaluation and management.   Patient has been started on heparin drip and nitroglycerin drip.  Assessment and Plan: 1-NSTEMI -Heart score 4-5 -After discussing with cardiology service planning for conservative management with 48 hours of heparin drip -No longer experiencing chest pain or shortness of breath; nitroglycerin drip has been discontinued.  No beta-blocker secondary to appreciated bradycardia. -Continue aspirin, statin and ARB.  As needed oxygen supplementation in place -2D echo has demonstrated wall motion abnormalities; nonetheless after discussing with cardiology service they did not feel best  candidate for cardiac catheterization at the moment.  2-chronic anemia -Positive fecal occult blood test have been seen as an outpatient -At that time patient reported reported no interest for endoscopic evaluation -Hemoglobin down to 11.6 and no signs of overt bleeding appreciated -Continue to follow trend.  3-hyperlipidemia -Continue statin and follow results of lipoprotein A.  4-type 2 diabetes -Diet control as an outpatient -Continue sliding scale insulin. -A1c 6.3.  5-hypertension -Soft blood pressure appreciated -Norvasc has been discontinued -Continue treatment with losartan.  6-GERD/GI prophylaxis. -Continue PPI.  7-chronic kidney disease stage IIIb -Appears to be stable and at baseline -Continue to follow renal function trend and minimize nephrotoxic agents.  8-hypokalemia -Repleted and within normal limits currently -Continue to follow electrolytes trend.   Subjective:  No chest pain, no nausea, no vomiting.  Good saturation on room air appreciated.  Reports feeling better.  Physical Exam: Vitals:   12/14/23 1300 12/14/23 1700 12/14/23 1730 12/14/23 1745  BP: (!) 124/28 128/60    Pulse: 62 64 85 60  Resp: (!) 25 (!) 22 (!) 22 16  Temp:   99 F (37.2 C)   TempSrc:   Oral   SpO2: 94% 95% 98% 96%  Weight:      Height:       General exam: Alert, awake, following commands appropriately and in no acute distress. Respiratory system: Good saturation on room air. Cardiovascular system:RRR.  No rubs or gallops. Gastrointestinal system: Abdomen is nondistended, soft and nontender. No organomegaly or masses felt. Normal bowel sounds heard. Central nervous system: Moving 4 limbs spontaneously.  No focal neurological deficits. Extremities: No cyanosis or clubbing. Skin: No petechiae. Psychiatry:  Mood & affect appropriate.   Data Reviewed: CBC: WBCs 11.2, hemoglobin  11.1 and platelet count 195K Basic metabolic panel: Sodium 138, potassium 4.7, chloride 106,  bicarb 26, BUN 30, creatinine 1.3 and GFR 36.  Family Communication: Daughter at bedside.  Disposition: Status is: Inpatient Remains inpatient appropriate because: Continue conservative management for NSTEMI.   Planned Discharge Destination: Home  Time spent: 50 minutes  Author: Vassie Loll, MD 12/14/2023 6:19 PM  For on call review [email or web address].

## 2023-12-14 NOTE — Plan of Care (Signed)
  Problem: Education: Goal: Knowledge of General Education information will improve Description: Including pain rating scale, medication(s)/side effects and non-pharmacologic comfort measures Outcome: Progressing   Problem: Health Behavior/Discharge Planning: Goal: Ability to manage health-related needs will improve Outcome: Progressing   Problem: Clinical Measurements: Goal: Ability to maintain clinical measurements within normal limits will improve Outcome: Progressing Goal: Will remain free from infection Outcome: Progressing Goal: Diagnostic test results will improve Outcome: Progressing Goal: Respiratory complications will improve Outcome: Progressing Goal: Cardiovascular complication will be avoided Outcome: Progressing   Problem: Activity: Goal: Risk for activity intolerance will decrease Outcome: Progressing   Problem: Nutrition: Goal: Adequate nutrition will be maintained Outcome: Progressing   Problem: Coping: Goal: Level of anxiety will decrease Outcome: Progressing   Problem: Elimination: Goal: Will not experience complications related to bowel motility Outcome: Progressing Goal: Will not experience complications related to urinary retention Outcome: Progressing   Problem: Pain Managment: Goal: General experience of comfort will improve and/or be controlled Outcome: Progressing   Problem: Safety: Goal: Ability to remain free from injury will improve Outcome: Progressing   Problem: Skin Integrity: Goal: Risk for impaired skin integrity will decrease Outcome: Progressing   Problem: Education: Goal: Ability to describe self-care measures that may prevent or decrease complications (Diabetes Survival Skills Education) will improve Outcome: Progressing Goal: Individualized Educational Video(s) Outcome: Progressing   Problem: Coping: Goal: Ability to adjust to condition or change in health will improve Outcome: Progressing   Problem: Fluid  Volume: Goal: Ability to maintain a balanced intake and output will improve Outcome: Progressing   Problem: Health Behavior/Discharge Planning: Goal: Ability to identify and utilize available resources and services will improve Outcome: Progressing Goal: Ability to manage health-related needs will improve Outcome: Progressing   Problem: Metabolic: Goal: Ability to maintain appropriate glucose levels will improve Outcome: Progressing   Problem: Nutritional: Goal: Maintenance of adequate nutrition will improve Outcome: Progressing Goal: Progress toward achieving an optimal weight will improve Outcome: Progressing   Problem: Skin Integrity: Goal: Risk for impaired skin integrity will decrease Outcome: Progressing   Problem: Tissue Perfusion: Goal: Adequacy of tissue perfusion will improve Outcome: Progressing   Problem: Education: Goal: Understanding of cardiac disease, CV risk reduction, and recovery process will improve Outcome: Progressing Goal: Individualized Educational Video(s) Outcome: Progressing   Problem: Activity: Goal: Ability to tolerate increased activity will improve Outcome: Progressing   Problem: Cardiac: Goal: Ability to achieve and maintain adequate cardiovascular perfusion will improve Outcome: Progressing   Problem: Health Behavior/Discharge Planning: Goal: Ability to safely manage health-related needs after discharge will improve Outcome: Progressing

## 2023-12-14 NOTE — Progress Notes (Signed)
   12/14/23 1244  TOC Brief Assessment  Insurance and Status Reviewed  Patient has primary care physician Yes  Home environment has been reviewed from home  Prior level of function: independent  Prior/Current Home Services No current home services  Social Drivers of Health Review SDOH reviewed no interventions necessary  Readmission risk has been reviewed Yes  Transition of care needs no transition of care needs at this time   Reviewed pt's record and discussed pt's status with MD in progression rounds. No immediate TOC needs identified. Will follow and assist if needs arise.

## 2023-12-14 NOTE — Progress Notes (Signed)
 PHARMACY - ANTICOAGULATION CONSULT NOTE  Pharmacy Consult for heparin Indication:  NSTEMI  Labs: Recent Labs    12/13/23 1015 12/13/23 1210 12/13/23 1918 12/14/23 0503  HGB 13.5  --   --  11.1*  HCT 41.4  --   --  35.5*  PLT 220  --   --  195  HEPARINUNFRC  --   --  0.28* 0.33  CREATININE 1.16*  --   --  1.35*  TROPONINIHS 671* 674*  --   --    Assessment/Plan:  89yo female therapeutic on heparin after rate change. Will continue infusion at current rate of 700 units/hr and confirm stable with additional level.  Vernard Gambles, PharmD, BCPS 12/14/2023 6:18 AM

## 2023-12-15 DIAGNOSIS — I214 Non-ST elevation (NSTEMI) myocardial infarction: Secondary | ICD-10-CM | POA: Diagnosis not present

## 2023-12-15 LAB — CBC
HCT: 34.4 % — ABNORMAL LOW (ref 36.0–46.0)
Hemoglobin: 10.8 g/dL — ABNORMAL LOW (ref 12.0–15.0)
MCH: 29 pg (ref 26.0–34.0)
MCHC: 31.4 g/dL (ref 30.0–36.0)
MCV: 92.2 fL (ref 80.0–100.0)
Platelets: 167 10*3/uL (ref 150–400)
RBC: 3.73 MIL/uL — ABNORMAL LOW (ref 3.87–5.11)
RDW: 12.5 % (ref 11.5–15.5)
WBC: 10.5 10*3/uL (ref 4.0–10.5)
nRBC: 0 % (ref 0.0–0.2)

## 2023-12-15 LAB — GLUCOSE, CAPILLARY
Glucose-Capillary: 125 mg/dL — ABNORMAL HIGH (ref 70–99)
Glucose-Capillary: 96 mg/dL (ref 70–99)
Glucose-Capillary: 133 mg/dL — ABNORMAL HIGH (ref 70–99)

## 2023-12-15 LAB — HEPARIN LEVEL (UNFRACTIONATED): Heparin Unfractionated: 0.4 [IU]/mL (ref 0.30–0.70)

## 2023-12-15 LAB — FERRITIN: Ferritin: 98 ng/mL (ref 11–307)

## 2023-12-15 MED ORDER — PANTOPRAZOLE SODIUM 40 MG PO TBEC: 40.0000 mg | DELAYED_RELEASE_TABLET | Freq: Every day | ORAL | 1 refills | Status: DC

## 2023-12-15 MED ORDER — LOSARTAN POTASSIUM 25 MG PO TABS: 25.0000 mg | ORAL_TABLET | Freq: Every day | ORAL | 1 refills | Status: DC

## 2023-12-15 MED ORDER — ASPIRIN 81 MG PO TBEC: 81.0000 mg | DELAYED_RELEASE_TABLET | Freq: Every day | ORAL | 12 refills | Status: DC

## 2023-12-15 MED ORDER — ATORVASTATIN CALCIUM 40 MG PO TABS: 40.0000 mg | ORAL_TABLET | Freq: Every day | ORAL | 1 refills | Status: DC

## 2023-12-15 NOTE — Discharge Summary (Signed)
 Physician Discharge Summary   Patient: Linda Golden MRN: [id number] DOB: 04-01-1927  Admit date:     12/13/2023  Discharge date: 12/15/23  Discharge Physician: Vassie Loll   PCP: Shelva Majestic, MD   Recommendations at discharge:  Reassess blood pressure and adjust antihypertensive treatment as needed Repeat basic metabolic panel to follow electrolytes renal function Repeat CBC to assess for stability/trend of hemoglobin. Make sure patient follow-up with cardiology service as instructed. Follow Lipoprotein A results.  Discharge Diagnoses: Principal Problem:   NSTEMI (non-ST elevated myocardial infarction) (HCC) Chronic anemia Hyperlipidemia Type 2 diabetes Hypertension GERD Chronic kidney disease stage IIIb Hypokalemia  Brief hospital admission narrative: Linda Golden is a 89 y.o. female with medical history significant of type 2 diabetes (diet-controlled), hypertension, hyperlipidemia and GERD; who presented to the hospital secondary to chest pressure and associated nausea.  Patient reports waking up with mentioned symptoms and given failure to go away contacted fire department/EMS in order to get her vital sign check.  Patient was transported to the emergency department for further evaluation and management.     Patient denies intermittent previous episodes of nausea and emesis; no vomiting reported at this time.  Patient is afebrile, no sick contacts that she is aware of, no dysuria, no hematuria, no abdominal pain, no melena or hematochezia.  Recent workup as an outpatient with PCP demonstrated elevated LDL and positive Hemoccult test; but a stable hemoglobin.   Workup in the ED demonstrating EKG with sinus rhythm and and ST segment changes suggestive of inferoposterior injury; high sensitive troponin elevated at 671.  Chest x-ray without acute cardiopulmonary process.  Cardiology service contacted and [facility] consulted to place patient in the hospital for further evaluation and  management.    Assessment and Plan: 1-NSTEMI -Heart score 4-5 -After discussing with cardiology service decided plan was for conservative management with 48 hours of heparin drip and at discharge continue the use of aspirin, statin and ARB -Patient reports no chest pain and expressed no shortness of breath or the need of oxygen supplementation at discharge. -No beta-blocker secondary to appreciated bradycardia. -Continue outpatient follow-up with cardiology service. -2D echo has demonstrated wall motion abnormalities; nonetheless after discussing with cardiology service they did not feel patient was best candidate for cardiac catheterization at the moment.   2-chronic anemia -Positive fecal occult blood test have been seen as an outpatient -At that time patient reported reported no interest for endoscopic evaluation -Hemoglobin down to 11.6 and no signs of overt bleeding appreciated -Continue to follow trend.   3-hyperlipidemia -Continue statin and follow results of lipoprotein A (pending at discharge).   4-type 2 diabetes -Diet control as an outpatient -Continue sliding scale insulin. -A1c 6.3.   5-hypertension -Soft blood pressure appreciated -Norvasc has been discontinued -Continue treatment with losartan.   6-GERD/GI prophylaxis. -Continue PPI.   7-chronic kidney disease stage IIIb -Appears to be stable and at baseline -Continue to follow renal function trend and minimize nephrotoxic agents.   8-hypokalemia -Repleted and within normal limits curren  Consultants: Cardiology service Procedures performed: See below for x-ray reports. Disposition: Home Diet recommendation: Heart healthy/low-sodium diet.    DISCHARGE MEDICATION: Allergies as of 12/15/2023       Reactions   Lisinopril Cough   Statins Other (See Comments)   Hair loss   Sulfa Antibiotics Other (See Comments)   No reaction-patient states her family member was "burned" from this medication         Medication List  STOP taking these medications    hydrochlorothiazide 25 MG tablet Commonly known as: HYDRODIURIL       TAKE these medications    amLODipine 2.5 MG tablet Commonly known as: NORVASC TAKE (1) TABLET BY MOUTH ONCE DAILY.   aspirin EC 81 MG tablet Take 1 tablet (81 mg total) by mouth daily. Swallow whole. Start taking on: December 16, 2023   atorvastatin 40 MG tablet Commonly known as: LIPITOR Take 1 tablet (40 mg total) by mouth daily. Start taking on: December 16, 2023   fluticasone 50 MCG/ACT nasal spray Commonly known as: FLONASE Place 1 spray into both nostrils daily.   losartan 25 MG tablet Commonly known as: COZAAR Take 1 tablet (25 mg total) by mouth daily. Start taking on: December 16, 2023 What changed:  medication strength See the new instructions.   pantoprazole 40 MG tablet Commonly known as: PROTONIX Take 1 tablet (40 mg total) by mouth daily. Start taking on: December 16, 2023        Follow-up Information     Shelva Majestic, MD. Schedule an appointment as soon as possible for a visit in 2 week(s).   Specialty: Family Medicine Contact information: [address] [place] [address] [phone]         Jonelle Sidle, MD. Schedule an appointment as soon as possible for a visit in 3 week(s).   Specialty: Cardiology Contact information: [address] [place] [address] [phone]                Discharge Exam: Filed Weights   12/13/23 1510 12/14/23 0500  Weight: 53.7 kg 55.8 kg   General exam: Alert, awake, following commands appropriately and in no acute distress.  Feeling ready to go home. Respiratory system: Good saturation on room air.  No using accessory muscle. Cardiovascular system:RRR.  No rubs or gallops.  No JVD on exam. Gastrointestinal system: Abdomen is nondistended, soft and nontender. No organomegaly or masses felt. Normal bowel sounds heard. Central nervous system: Moving 4 limbs  spontaneously.  No focal neurological deficits. Extremities: No cyanosis or clubbing. Skin: No petechiae. Psychiatry:  Mood & affect appropriate.   Condition at discharge: Stable and improved.  The results of significant diagnostics from this hospitalization (including imaging, microbiology, ancillary and laboratory) are listed below for reference.   Imaging Studies: ECHOCARDIOGRAM COMPLETE Result Date: 12/13/2023    ECHOCARDIOGRAM REPORT   Patient Name:   Linda Golden Date of Exam: 12/13/2023 Medical Rec #:  [id number]     Height:       63.0 in Accession #:    [id number]    Weight:       110.8 lb Date of Birth:  1926/11/29      BSA:          1.504 m Patient Age:    89 years      BP:           113/38 mmHg Patient Gender: F             HR:           56 bpm. [facility] Procedure: 2D Echo, Cardiac Doppler and Color Doppler (Both Spectral and Color            Flow Doppler were utilized during procedure). Indications:    NSTEMI I21.4  History:        Patient has no prior history of Echocardiogram examinations.  Acute MI; Risk Factors:Diabetes and Hypertension.  Sonographer:    Webb Laws Referring Phys: [id number] Khayden Herzberg IMPRESSIONS  1. Left ventricular ejection fraction, by estimation, is 60 to 65%. The left ventricle has normal function. The left ventricle demonstrates regional wall motion abnormalities (see scoring diagram/findings for description). There is severe asymmetric left ventricular hypertrophy of the basal segment. Left ventricular diastolic parameters are consistent with Grade I diastolic dysfunction (impaired relaxation).  2. Right ventricular systolic function is normal. The right ventricular size is normal. There is normal pulmonary artery systolic pressure. The estimated right ventricular systolic pressure is 28.4 mmHg.  3. Left atrial size was upper normal.  4. The mitral valve is degenerative. Trivial mitral valve regurgitation.  5. The aortic valve is  tricuspid. There is moderate calcification of the aortic valve with restricted motion of the noncoronary cusp. Aortic valve regurgitation is not visualized.  6. The inferior vena cava is normal in size with greater than 50% respiratory variability, suggesting right atrial pressure of 3 mmHg. Comparison(s): No prior Echocardiogram. FINDINGS  Left Ventricle: Left ventricular ejection fraction, by estimation, is 60 to 65%. The left ventricle has normal function. The left ventricle demonstrates regional wall motion abnormalities. Strain imaging was not performed. The left ventricular internal cavity size was normal in size. There is severe asymmetric left ventricular hypertrophy of the basal segment. Left ventricular diastolic parameters are consistent with Grade I diastolic dysfunction (impaired relaxation).  LV Wall Scoring: The basal inferior segment and basal inferoseptal segment are hypokinetic. The entire anterior wall, entire lateral wall, entire anterior septum, entire apex, mid and distal inferior wall, and mid inferoseptal segment are normal. Right Ventricle: The right ventricular size is normal. No increase in right ventricular wall thickness. Right ventricular systolic function is normal. There is normal pulmonary artery systolic pressure. The tricuspid regurgitant velocity is 2.52 m/s, and  with an assumed right atrial pressure of 3 mmHg, the estimated right ventricular systolic pressure is 28.4 mmHg. Left Atrium: Left atrial size was upper normal. Right Atrium: Right atrial size was normal in size. Pericardium: There is no evidence of pericardial effusion. Presence of epicardial fat layer. Mitral Valve: The mitral valve is degenerative in appearance. There is mild calcification of the mitral valve leaflet(s). Mild mitral annular calcification. Trivial mitral valve regurgitation. Tricuspid Valve: The tricuspid valve is grossly normal. Tricuspid valve regurgitation is mild. Aortic Valve: The aortic valve is  tricuspid. There is moderate calcification of the aortic valve. There is mild aortic valve annular calcification. Aortic valve regurgitation is not visualized. Pulmonic Valve: The pulmonic valve was not well visualized. Pulmonic valve regurgitation is trivial. Aorta: The aortic root and ascending aorta are structurally normal, with no evidence of dilitation. Venous: The inferior vena cava is normal in size with greater than 50% respiratory variability, suggesting right atrial pressure of 3 mmHg. IAS/Shunts: The interatrial septum was not well visualized. Additional Comments: 3D imaging was not performed.  LEFT VENTRICLE PLAX 2D LVIDd:         2.10 cm   Diastology LVIDs:         1.60 cm   LV e' medial:    4.03 cm/s LV PW:         1.50 cm   LV E/e' medial:  24.1 LV IVS:        1.90 cm   LV e' lateral:   7.29 cm/s LVOT diam:     1.80 cm   LV E/e' lateral: 13.3 LV SV:  61 LV SV Index:   40 LVOT Area:     2.54 cm  RIGHT VENTRICLE RV Basal diam:  2.90 cm RV S prime:     16.00 cm/s TAPSE (M-mode): 2.1 cm LEFT ATRIUM             Index        RIGHT ATRIUM          Index LA diam:        4.30 cm 2.86 cm/m   RA Area:     9.91 cm LA Vol (A2C):   49.3 ml 32.77 ml/m  RA Volume:   15.40 ml 10.24 ml/m LA Vol (A4C):   42.3 ml 28.12 ml/m LA Biplane Vol: 45.8 ml 30.44 ml/m  AORTIC VALVE LVOT Vmax:   95.10 cm/s LVOT Vmean:  70.400 cm/s LVOT VTI:    0.239 m  AORTA Ao Root diam: 2.60 cm Ao Asc diam:  3.30 cm MITRAL VALVE                TRICUSPID VALVE MV Area (PHT): 2.54 cm     TR Peak grad:   25.4 mmHg MV Decel Time: 299 msec     TR Vmax:        252.00 cm/s MV E velocity: 97.30 cm/s MV A velocity: 134.00 cm/s  SHUNTS MV E/A ratio:  0.73         Systemic VTI:  0.24 m                             Systemic Diam: 1.80 cm Nona Dell MD Electronically signed by Nona Dell MD Signature Date/Time: 12/13/2023/2:37:43 PM    Final    DG Chest Port 1 View Result Date: 12/13/2023 CLINICAL DATA:  Fever and chills. EXAM:  PORTABLE CHEST 1 VIEW COMPARISON:  Chest radiograph dated 12/03/2023. FINDINGS: Stable mild cardiomegaly. Aortic atherosclerosis. Emphysematous changes in the upper lungs. No focal consolidation, sizeable pleural effusion, or pneumothorax. Osteopenia. No acute osseous abnormality. IMPRESSION: No acute cardiopulmonary findings.  Emphysema. Electronically Signed   By: Hart Robinsons M.D.   On: 12/13/2023 13:43    Microbiology: Results for orders placed or performed during the hospital encounter of 12/13/23  MRSA Next Gen by PCR, Nasal     Status: None   Collection Time: 12/13/23  3:08 PM   Specimen: Nasal Mucosa; Nasal Swab  Result Value Ref Range Status   MRSA by PCR Next Gen NOT DETECTED NOT DETECTED Final    Comment: (NOTE) The GeneXpert MRSA Assay (FDA approved for NASAL specimens only), is one component of a comprehensive MRSA colonization surveillance program. It is not intended to diagnose MRSA infection nor to guide or monitor treatment for MRSA infections. Test performance is not FDA approved in patients less than 33 years old. Performed at [facility], [address], [place], [place] [address]     Labs: CBC: Recent Labs  Lab 12/13/23 1015 12/14/23 0503 12/15/23 0327  WBC 10.0 11.2* 10.5  HGB 13.5 11.1* 10.8*  HCT 41.4 35.5* 34.4*  MCV 89.8 92.2 92.2  PLT 220 195 167   Basic Metabolic Panel: Recent Labs  Lab 12/13/23 1015 12/14/23 0503  NA 138 138  K 3.4* 4.7  CL 100 106  CO2 26 26  GLUCOSE 155* 126*  BUN 28* 30*  CREATININE 1.16* 1.35*  CALCIUM 9.9 8.9   Liver Function Tests: Recent Labs  Lab 12/13/23 1015  AST 25  ALT 14  ALKPHOS 62  BILITOT 0.9  PROT 7.5  ALBUMIN 4.2   CBG: Recent Labs  Lab 12/14/23 1735 12/14/23 2127 12/15/23 0745 12/15/23 1147 12/15/23 1542  GLUCAP 143* 135* 125* 96 133*    Discharge time spent: greater than 30 minutes.  Signed: Vassie Loll, MD [facility] 12/15/2023

## 2023-12-15 NOTE — Progress Notes (Signed)
 Family with patient. She is stable at this time and Dr. Gwenlyn Perking completed order for discharge. All IV access removed and information given to patient. She will be taken down to car by wheel chair.

## 2023-12-15 NOTE — Progress Notes (Addendum)
 PHARMACY - ANTICOAGULATION CONSULT NOTE  Pharmacy Consult for heparin Indication: chest pain/ACS  Allergies  Allergen Reactions   Lisinopril Cough   Statins Other (See Comments)    Hair loss   Sulfa Antibiotics Other (See Comments)    No reaction-patient states her family member was "burned" from this medication    Patient Measurements: Height: 5\' 3"  (160 cm) Weight: 55.8 kg (123 lb 0.3 oz) IBW/kg (Calculated) : 52.4 Heparin Dosing Weight: 50 kg  Vital Signs: Temp: 98.4 F (36.9 C) (03/01 0748) Temp Source: Oral (03/01 0748) BP: 118/39 (03/01 0500) Pulse Rate: 52 (03/01 0500)  Labs: Recent Labs    12/13/23 1015 12/13/23 1210 12/13/23 1918 12/14/23 0503 12/14/23 1107 12/15/23 0327  HGB 13.5  --   --  11.1*  --  10.8*  HCT 41.4  --   --  35.5*  --  34.4*  PLT 220  --   --  195  --  167  HEPARINUNFRC  --   --    < > 0.33 0.35 0.40  CREATININE 1.16*  --   --  1.35*  --   --   TROPONINIHS 671* 674*  --   --   --   --    < > = values in this interval not displayed.    Estimated Creatinine Clearance: 21.1 mL/min (A) (by C-G formula based on SCr of 1.35 mg/dL (H)).   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 90 tablet 3 12/12/2023 Morning   fluticasone (FLONASE) 50 MCG/ACT nasal spray Place 1 spray into both nostrils daily. 16 g 0 Taking   hydrochlorothiazide (HYDRODIURIL) 25 MG tablet TAKE 1 TABLET BY MOUTH DAILY WITH LOSARTAN. 90 tablet 3 12/12/2023 Morning   losartan (COZAAR) 100 MG tablet TAKE 1 TABLET BY MOUTH DAILY WITH HYDROCHLOROTHIAZIDE. 90 tablet 3 12/12/2023 Morning    Assessment: Pharmacy consulted to dose  heparin in patient with chest pain/ACS.  Patient is not on anticoagulation prior to admission. Cardiology saw patient and does not plan on cath lab for patient at this time due to recent outpatient stools heme(+) and AKI. They wish to continue heparin for 48 hours.   CBC WNL Trop 671  Goal of Therapy:  Heparin level 0.3-0.7 units/ml Monitor platelets by anticoagulation protocol: Yes  Date Time HL Rate/Comment  2/27 1918 0.28 Subtherapeutic 2/28 0503 0.33 Therapeutic x 1 2/28 1107 0.35 Therapeutic x 2 3/01 0327 0.40 Therapeutic   Plan:  Continue heparin infusion at 700 units/hr Stop heparin after 48 hours 48 hours of heparin will hit today, 3/1 @ 1200 No further heparin levels will be obtained since drip set to stop  Thank you for involving pharmacy in this patient's care.   Will M. Dareen Piano, PharmD Clinical Pharmacist 12/15/2023 7:51 AM

## 2023-12-15 NOTE — Plan of Care (Signed)

## 2023-12-17 ENCOUNTER — Telehealth: Payer: Self-pay | Admitting: *Deleted

## 2023-12-17 LAB — LIPOPROTEIN A (LPA): Lipoprotein (a): 66.1 nmol/L — ABNORMAL HIGH (ref <75.0)

## 2023-12-17 NOTE — Transitions of Care (Post Inpatient/ED Visit) (Signed)
   12/17/2023  Name: CARMELINA BALDUCCI MRN: [id number] DOB: [birth year]  Today's TOC FU Call Status: Today's TOC FU Call Status:: Unsuccessful Call (1st Attempt) Unsuccessful Call (1st Attempt) Date: 12/17/23  Attempted to reach the patient regarding the most recent Inpatient/ED visit.  Follow Up Plan: Additional outreach attempts will be made to reach the patient to complete the Transitions of Care (Post Inpatient/ED visit) call.   Irving Shows [facility], BSN RN Care Manager/ Transition of Care [place]/ [facility] [phone]

## 2023-12-18 ENCOUNTER — Telehealth: Payer: Self-pay | Admitting: *Deleted

## 2023-12-18 NOTE — Transitions of Care (Post Inpatient/ED Visit) (Signed)
   12/18/2023  Name: Linda Golden MRN: [id number] DOB: 08/09/1927  Today's TOC FU Call Status: Today's TOC FU Call Status:: Unsuccessful Call (2nd Attempt) Unsuccessful Call (2nd Attempt) Date: 12/18/23  Attempted to reach the patient regarding the most recent Inpatient/ED visit.  Follow Up Plan: Additional outreach attempts will be made to reach the patient to complete the Transitions of Care (Post Inpatient/ED visit) call.   Irving Shows [facility], BSN RN Care Manager/ Transition of Care [place]/ [facility] [phone]

## 2023-12-19 ENCOUNTER — Telehealth: Payer: Self-pay | Admitting: *Deleted

## 2023-12-19 NOTE — Transitions of Care (Post Inpatient/ED Visit) (Signed)
 12/19/2023  Name: Linda Golden MRN: [id number] DOB: 1927/01/17  Today's TOC FU Call Status: Today's TOC FU Call Status:: Successful TOC FU Call Completed TOC FU Call Complete Date: 12/19/23 Patient's Name and Date of Birth confirmed.  Transition Care Management Follow-up Telephone Call Date of Discharge: 12/15/23 [facility] Type of Discharge: Inpatient Admission Primary Inpatient Discharge Diagnosis:: non-ST elevated myocardial infarction How have you been since you were released from the hospital?: Better Any questions or concerns?: No (I must be doing better I am washing clothes)  Items Reviewed: Did you receive and understand the discharge instructions provided?: Yes Medications obtained,verified, and reconciled?: Yes (Medications Reviewed) Any new allergies since your discharge?: No Dietary orders reviewed?: Yes Type of Diet Ordered:: low sodium heart healthy Do you have support at home?: Yes People in Home: child(ren), adult Name of Support/Comfort Primary Source: Iantha Fallen  Medications Reviewed Today: Medications Reviewed Today     Reviewed by Luella Cook, RN (Case Manager) on 12/19/23 at 1225  Med List Status: <None>   Medication Order Taking? Sig Documenting Provider Last Dose Status Informant  amLODipine (NORVASC) 2.5 MG tablet [id number] Yes TAKE (1) TABLET BY MOUTH ONCE DAILY. Shelva Majestic, MD Taking Active Self, Family Member, Pharmacy Records  aspirin EC 81 MG tablet [id number] Yes Take 1 tablet (81 mg total) by mouth daily. Swallow whole. Vassie Loll, MD Taking Active   atorvastatin (LIPITOR) 40 MG tablet [id number] Yes Take 1 tablet (40 mg total) by mouth daily. Vassie Loll, MD Taking Active   fluticasone [facility]) 50 MCG/ACT nasal spray [id number] Yes Place 1 spray into both nostrils daily. Raspet, Noberto Retort, PA-C Taking Active Self, Family Member, Pharmacy Records  losartan (COZAAR) 25 MG tablet [id number] Yes Take 1 tablet (25  mg total) by mouth daily. Vassie Loll, MD Taking Active   pantoprazole (PROTONIX) 40 MG tablet [id number] Yes Take 1 tablet (40 mg total) by mouth daily. Vassie Loll, MD Taking Active             Home Care and Equipment/Supplies: Were Home Health Services Ordered?: NA Any new equipment or medical supplies ordered?: NA  Functional Questionnaire: Do you need assistance with bathing/showering or dressing?: No Do you need assistance with meal preparation?: No Do you need assistance with eating?: No Do you have difficulty maintaining continence: No Do you need assistance with getting out of bed/getting out of a chair/moving?: No Do you have difficulty managing or taking your medications?: No  Follow up appointments reviewed: PCP Follow-up appointment confirmed?: Yes Date of PCP follow-up appointment?: 01/09/24 Follow-up Provider: [facility] Follow-up appointment confirmed?: No Reason Specialist Follow-Up Not Confirmed: Patient has Specialist Provider Number and will Call for Appointment (Spoke with son and he understands to get an appt with Dr Nona Dell) Do you need transportation to your follow-up appointment?: No Do you understand care options if your condition(s) worsen?: Yes-patient verbalized understanding  SDOH Interventions Today    Flowsheet Row Most Recent Value  SDOH Interventions   Food Insecurity Interventions Intervention Not Indicated  Housing Interventions Intervention Not Indicated  Transportation Interventions Intervention Not Indicated, Patient Resources (Friends/Family)  Utilities Interventions Intervention Not Indicated      Interventions Today    Flowsheet Row Most Recent Value  Chronic Disease   Chronic disease during today's visit Other  [non-ST elevated myocardial infarction]  General Interventions   General Interventions Discussed/Reviewed General Interventions Discussed, General Interventions Reviewed, Doctor Visits   Doctor Visits Discussed/Reviewed Doctor  Visits Discussed, Doctor Visits Reviewed, PCP, Specialist  PCP/Specialist Visits Compliance with follow-up visit  Education Interventions   Education Provided Provided Education  Provided Verbal Education On Nutrition  [low sodium heart healthy diet]  Nutrition Interventions   Nutrition Discussed/Reviewed Nutrition Discussed, Nutrition Reviewed, Decreasing salt  Pharmacy Interventions   Pharmacy Dicussed/Reviewed Pharmacy Topics Discussed, Pharmacy Topics Reviewed      No further TOC needs  Gean Maidens BSN RN [facility] Care Management Coordinator [email or web address] Direct Dial: [phone]  Fax: [phone] Website: [email or web address]

## 2023-12-20 ENCOUNTER — Telehealth (HOSPITAL_COMMUNITY): Payer: Self-pay

## 2023-12-20 NOTE — Telephone Encounter (Signed)
 Called patient Patient difficulty making appointment/understanding Called Iantha Fallen Number not in service Called Shellia Cleverly provided new number for Iantha Fallen (updated in demographics) Bess scheduled appointment in [place] for patient. I provided contact number and other information for appointment to bess while on the phone.  Bess expressed understanding, and confirmed information.

## 2024-01-03 ENCOUNTER — Other Ambulatory Visit: Payer: Self-pay

## 2024-01-03 ENCOUNTER — Ambulatory Visit: Attending: Family Medicine

## 2024-01-03 DIAGNOSIS — I739 Peripheral vascular disease, unspecified: Secondary | ICD-10-CM | POA: Diagnosis not present

## 2024-01-03 LAB — VAS US ABI WITH/WO TBI
Left ABI: 0.52
Right ABI: 0.4

## 2024-01-06 ENCOUNTER — Emergency Department (HOSPITAL_COMMUNITY)

## 2024-01-06 ENCOUNTER — Other Ambulatory Visit: Payer: Self-pay

## 2024-01-06 ENCOUNTER — Inpatient Hospital Stay (HOSPITAL_COMMUNITY)
Admission: EM | Admit: 2024-01-06 | Discharge: 2024-01-08 | DRG: 280 | Disposition: A | Discharge status: Home Health | Attending: Family Medicine | Admitting: Family Medicine

## 2024-01-06 ENCOUNTER — Encounter (HOSPITAL_COMMUNITY): Payer: Self-pay | Admitting: Emergency Medicine

## 2024-01-06 DIAGNOSIS — N183 Chronic kidney disease, stage 3 unspecified: Secondary | ICD-10-CM | POA: Diagnosis present

## 2024-01-06 DIAGNOSIS — R413 Other amnesia: Secondary | ICD-10-CM | POA: Diagnosis present

## 2024-01-06 DIAGNOSIS — R09A2 Foreign body sensation, throat: Secondary | ICD-10-CM | POA: Diagnosis present

## 2024-01-06 DIAGNOSIS — M8580 Other specified disorders of bone density and structure, unspecified site: Secondary | ICD-10-CM | POA: Diagnosis present

## 2024-01-06 DIAGNOSIS — Z882 Allergy status to sulfonamides status: Secondary | ICD-10-CM

## 2024-01-06 DIAGNOSIS — N1831 Chronic kidney disease, stage 3a: Secondary | ICD-10-CM | POA: Diagnosis not present

## 2024-01-06 DIAGNOSIS — R0602 Shortness of breath: Secondary | ICD-10-CM | POA: Diagnosis not present

## 2024-01-06 DIAGNOSIS — D631 Anemia in chronic kidney disease: Secondary | ICD-10-CM | POA: Diagnosis not present

## 2024-01-06 DIAGNOSIS — R911 Solitary pulmonary nodule: Secondary | ICD-10-CM | POA: Diagnosis not present

## 2024-01-06 DIAGNOSIS — I110 Hypertensive heart disease with heart failure: Secondary | ICD-10-CM | POA: Diagnosis not present

## 2024-01-06 DIAGNOSIS — R0789 Other chest pain: Secondary | ICD-10-CM | POA: Diagnosis not present

## 2024-01-06 DIAGNOSIS — E1169 Type 2 diabetes mellitus with other specified complication: Secondary | ICD-10-CM | POA: Diagnosis not present

## 2024-01-06 DIAGNOSIS — Z79899 Other long term (current) drug therapy: Secondary | ICD-10-CM

## 2024-01-06 DIAGNOSIS — I6521 Occlusion and stenosis of right carotid artery: Secondary | ICD-10-CM | POA: Diagnosis present

## 2024-01-06 DIAGNOSIS — Z7982 Long term (current) use of aspirin: Secondary | ICD-10-CM

## 2024-01-06 DIAGNOSIS — R001 Bradycardia, unspecified: Secondary | ICD-10-CM | POA: Diagnosis present

## 2024-01-06 DIAGNOSIS — I2721 Secondary pulmonary arterial hypertension: Secondary | ICD-10-CM | POA: Diagnosis present

## 2024-01-06 DIAGNOSIS — E1122 Type 2 diabetes mellitus with diabetic chronic kidney disease: Secondary | ICD-10-CM | POA: Diagnosis present

## 2024-01-06 DIAGNOSIS — K7460 Unspecified cirrhosis of liver: Secondary | ICD-10-CM | POA: Diagnosis present

## 2024-01-06 DIAGNOSIS — D649 Anemia, unspecified: Secondary | ICD-10-CM

## 2024-01-06 DIAGNOSIS — I672 Cerebral atherosclerosis: Secondary | ICD-10-CM | POA: Diagnosis not present

## 2024-01-06 DIAGNOSIS — R1310 Dysphagia, unspecified: Secondary | ICD-10-CM | POA: Diagnosis not present

## 2024-01-06 DIAGNOSIS — I5023 Acute on chronic systolic (congestive) heart failure: Secondary | ICD-10-CM | POA: Diagnosis present

## 2024-01-06 DIAGNOSIS — I214 Non-ST elevation (NSTEMI) myocardial infarction: Principal | ICD-10-CM | POA: Diagnosis present

## 2024-01-06 DIAGNOSIS — I130 Hypertensive heart and chronic kidney disease with heart failure and stage 1 through stage 4 chronic kidney disease, or unspecified chronic kidney disease: Secondary | ICD-10-CM | POA: Diagnosis present

## 2024-01-06 DIAGNOSIS — R079 Chest pain, unspecified: Secondary | ICD-10-CM | POA: Diagnosis not present

## 2024-01-06 DIAGNOSIS — R54 Age-related physical debility: Secondary | ICD-10-CM | POA: Diagnosis present

## 2024-01-06 DIAGNOSIS — E785 Hyperlipidemia, unspecified: Secondary | ICD-10-CM | POA: Diagnosis not present

## 2024-01-06 DIAGNOSIS — R140 Abdominal distension (gaseous): Secondary | ICD-10-CM | POA: Diagnosis not present

## 2024-01-06 DIAGNOSIS — R0989 Other specified symptoms and signs involving the circulatory and respiratory systems: Secondary | ICD-10-CM | POA: Diagnosis not present

## 2024-01-06 DIAGNOSIS — E1129 Type 2 diabetes mellitus with other diabetic kidney complication: Secondary | ICD-10-CM | POA: Diagnosis present

## 2024-01-06 DIAGNOSIS — I5033 Acute on chronic diastolic (congestive) heart failure: Secondary | ICD-10-CM

## 2024-01-06 DIAGNOSIS — N2889 Other specified disorders of kidney and ureter: Secondary | ICD-10-CM | POA: Diagnosis present

## 2024-01-06 DIAGNOSIS — R0689 Other abnormalities of breathing: Secondary | ICD-10-CM | POA: Diagnosis not present

## 2024-01-06 DIAGNOSIS — I10 Essential (primary) hypertension: Secondary | ICD-10-CM | POA: Diagnosis present

## 2024-01-06 DIAGNOSIS — N179 Acute kidney failure, unspecified: Secondary | ICD-10-CM | POA: Diagnosis not present

## 2024-01-06 DIAGNOSIS — K219 Gastro-esophageal reflux disease without esophagitis: Secondary | ICD-10-CM | POA: Diagnosis present

## 2024-01-06 DIAGNOSIS — I252 Old myocardial infarction: Secondary | ICD-10-CM | POA: Diagnosis present

## 2024-01-06 DIAGNOSIS — Z8249 Family history of ischemic heart disease and other diseases of the circulatory system: Secondary | ICD-10-CM | POA: Diagnosis not present

## 2024-01-06 DIAGNOSIS — I509 Heart failure, unspecified: Principal | ICD-10-CM

## 2024-01-06 DIAGNOSIS — R011 Cardiac murmur, unspecified: Secondary | ICD-10-CM | POA: Diagnosis present

## 2024-01-06 DIAGNOSIS — I493 Ventricular premature depolarization: Secondary | ICD-10-CM | POA: Diagnosis not present

## 2024-01-06 DIAGNOSIS — Z888 Allergy status to other drugs, medicaments and biological substances status: Secondary | ICD-10-CM

## 2024-01-06 DIAGNOSIS — R918 Other nonspecific abnormal finding of lung field: Secondary | ICD-10-CM | POA: Diagnosis not present

## 2024-01-06 DIAGNOSIS — I6529 Occlusion and stenosis of unspecified carotid artery: Secondary | ICD-10-CM | POA: Insufficient documentation

## 2024-01-06 DIAGNOSIS — Z66 Do not resuscitate: Secondary | ICD-10-CM | POA: Diagnosis present

## 2024-01-06 DIAGNOSIS — I499 Cardiac arrhythmia, unspecified: Secondary | ICD-10-CM | POA: Diagnosis not present

## 2024-01-06 LAB — BASIC METABOLIC PANEL
Anion gap: 12 (ref 5–15)
BUN: 22 mg/dL (ref 8–23)
CO2: 22 mmol/L (ref 22–32)
Calcium: 8.7 mg/dL — ABNORMAL LOW (ref 8.9–10.3)
Chloride: 109 mmol/L (ref 98–111)
Creatinine, Ser: 1.03 mg/dL — ABNORMAL HIGH (ref 0.44–1.00)
GFR, Estimated: 50 mL/min — ABNORMAL LOW (ref >60)
Glucose, Bld: 182 mg/dL — ABNORMAL HIGH (ref 70–99)
Potassium: 3.8 mmol/L (ref 3.5–5.1)
Sodium: 143 mmol/L (ref 135–145)

## 2024-01-06 LAB — TROPONIN I (HIGH SENSITIVITY)
Troponin I (High Sensitivity): 89 ng/L — ABNORMAL HIGH (ref <18)
Troponin I (High Sensitivity): 317 ng/L (ref <18)
Troponin I (High Sensitivity): 378 ng/L (ref <18)
Troponin I (High Sensitivity): 433 ng/L (ref <18)

## 2024-01-06 LAB — IRON AND TIBC
Iron: 36 ug/dL (ref 28–170)
Saturation Ratios: 13 % (ref 10.4–31.8)
TIBC: 280 ug/dL (ref 250–450)
UIBC: 244 ug/dL

## 2024-01-06 LAB — GLUCOSE, CAPILLARY: Glucose-Capillary: 212 mg/dL — ABNORMAL HIGH (ref 70–99)

## 2024-01-06 LAB — FERRITIN: Ferritin: 129 ng/mL (ref 11–307)

## 2024-01-06 LAB — CBC
HCT: 38.4 % (ref 36.0–46.0)
Hemoglobin: 11.4 g/dL — ABNORMAL LOW (ref 12.0–15.0)
MCH: 28.6 pg (ref 26.0–34.0)
MCHC: 29.7 g/dL — ABNORMAL LOW (ref 30.0–36.0)
MCV: 96.5 fL (ref 80.0–100.0)
Platelets: 183 10*3/uL (ref 150–400)
RBC: 3.98 MIL/uL (ref 3.87–5.11)
RDW: 12.5 % (ref 11.5–15.5)
WBC: 11.7 10*3/uL — ABNORMAL HIGH (ref 4.0–10.5)
nRBC: 0 % (ref 0.0–0.2)

## 2024-01-06 LAB — MAGNESIUM: Magnesium: 2.1 mg/dL (ref 1.7–2.4)

## 2024-01-06 LAB — BRAIN NATRIURETIC PEPTIDE: B Natriuretic Peptide: 2396.4 pg/mL — ABNORMAL HIGH (ref 0.0–100.0)

## 2024-01-06 LAB — CBG MONITORING, ED: Glucose-Capillary: 133 mg/dL — ABNORMAL HIGH (ref 70–99)

## 2024-01-06 MED ORDER — ONDANSETRON HCL 4 MG PO TABS
4.0000 mg | ORAL_TABLET | Freq: Four times a day (QID) | ORAL | Status: DC | PRN
Start: 2024-01-06 — End: 2024-01-08

## 2024-01-06 MED ORDER — ACETAMINOPHEN 325 MG PO TABS: 650.0000 mg | ORAL_TABLET | Freq: Four times a day (QID) | ORAL | Status: DC | PRN

## 2024-01-06 MED ORDER — PANTOPRAZOLE SODIUM 40 MG IV SOLR
40.0000 mg | INTRAVENOUS | Status: DC
  Administered 2024-01-07: 40 mg via INTRAVENOUS
  Filled 2024-01-06: qty 10

## 2024-01-06 MED ORDER — PANTOPRAZOLE SODIUM 40 MG PO TBEC
40.0000 mg | DELAYED_RELEASE_TABLET | Freq: Every day | ORAL | Status: DC
  Filled 2024-01-06: qty 1

## 2024-01-06 MED ORDER — ASPIRIN 81 MG PO TBEC
81.0000 mg | DELAYED_RELEASE_TABLET | Freq: Every day | ORAL | Status: DC
  Administered 2024-01-06 – 2024-01-08 (×3): 81 mg via ORAL
  Filled 2024-01-06 (×3): qty 1

## 2024-01-06 MED ORDER — DOCUSATE SODIUM 100 MG PO CAPS: 100.0000 mg | ORAL_CAPSULE | Freq: Every day | ORAL | Status: DC | PRN

## 2024-01-06 MED ORDER — ATORVASTATIN CALCIUM 40 MG PO TABS
40.0000 mg | ORAL_TABLET | Freq: Every day | ORAL | Status: DC
Start: 2024-01-06 — End: 2024-01-08
  Administered 2024-01-07 – 2024-01-08 (×2): 40 mg via ORAL
  Filled 2024-01-06 (×3): qty 1

## 2024-01-06 MED ORDER — IOHEXOL 350 MG/ML SOLN
75.0000 mL | Freq: Once | INTRAVENOUS | Status: AC | PRN
  Administered 2024-01-06: 75 mL via INTRAVENOUS

## 2024-01-06 MED ORDER — ACETAMINOPHEN 650 MG RE SUPP: 650.0000 mg | Freq: Four times a day (QID) | RECTAL | Status: DC | PRN

## 2024-01-06 MED ORDER — FUROSEMIDE 10 MG/ML IJ SOLN
20.0000 mg | Freq: Once | INTRAMUSCULAR | Status: AC
  Administered 2024-01-06: 20 mg via INTRAVENOUS
  Filled 2024-01-06: qty 2

## 2024-01-06 MED ORDER — ALUM & MAG HYDROXIDE-SIMETH 200-200-20 MG/5ML PO SUSP
30.0000 mL | Freq: Once | ORAL | Status: AC
  Administered 2024-01-06: 30 mL via ORAL
  Filled 2024-01-06: qty 30

## 2024-01-06 MED ORDER — ONDANSETRON HCL 4 MG/2ML IJ SOLN: 4.0000 mg | Freq: Four times a day (QID) | INTRAMUSCULAR | Status: DC | PRN

## 2024-01-06 MED ORDER — DEXAMETHASONE SODIUM PHOSPHATE 4 MG/ML IJ SOLN
4.0000 mg | Freq: Once | INTRAMUSCULAR | Status: AC
  Administered 2024-01-06: 4 mg via INTRAVENOUS
  Filled 2024-01-06: qty 1

## 2024-01-06 MED ORDER — FUROSEMIDE 10 MG/ML IJ SOLN
20.0000 mg | Freq: Every day | INTRAMUSCULAR | Status: DC
Start: 2024-01-07 — End: 2024-01-07
  Administered 2024-01-07: 20 mg via INTRAVENOUS
  Filled 2024-01-06: qty 2

## 2024-01-06 MED ORDER — LOSARTAN POTASSIUM 25 MG PO TABS
25.0000 mg | ORAL_TABLET | Freq: Every day | ORAL | Status: DC
Start: 2024-01-06 — End: 2024-01-08
  Administered 2024-01-07 – 2024-01-08 (×2): 25 mg via ORAL
  Filled 2024-01-06 (×3): qty 1

## 2024-01-06 MED ORDER — INSULIN ASPART 100 UNIT/ML IJ SOLN
0.0000 [IU] | Freq: Three times a day (TID) | INTRAMUSCULAR | Status: DC
  Administered 2024-01-06: 1 [IU] via SUBCUTANEOUS
  Administered 2024-01-07: 2 [IU] via SUBCUTANEOUS

## 2024-01-06 NOTE — ED Notes (Signed)
 Patient transported to CT

## 2024-01-06 NOTE — ED Notes (Signed)
 Family at bedside at this time

## 2024-01-06 NOTE — Assessment & Plan Note (Addendum)
 Stable. Baseline creatinine 1.0-1.1 continue to monitor

## 2024-01-06 NOTE — ED Provider Notes (Signed)
 [facility] EMERGENCY DEPARTMENT AT [facility] Provider Note   CSN: [id number] Arrival date & time: 01/06/24  2440     History {Add pertinent medical, surgical, social history, OB history to HPI:1} Chief Complaint  Patient presents with   Chest Pain    Linda Golden is a 89 y.o. female.  89 year old female with past medical history of GERD, hypertension, and hyperlipidemia presenting to the emergency department today with globus sensation.  The patient states she woke up and felt she could not breathe and felt there was something stuck in her throat.  She states that she was having some discomfort at that time.  She called her son who then called medics.  The patient was given a breathing treatment and she states that when she was getting that she cannot say that this was helping or not been on the way to the emergency department her symptoms have somewhat improved.  The patient states she is still feeling like she is having difficulty breathing/swallowing.  She states she is having a pressure sensation in the upper part of her chest with this.  She denies any pleuritic pain.  She denies any hemoptysis.  Denies any recent fevers, chills, or cough.  She came to the ER today for further evaluation regarding this due to ongoing symptoms.   Chest Pain      Home Medications Prior to Admission medications   Medication Sig Start Date End Date Taking? Authorizing Provider  amLODipine (NORVASC) 2.5 MG tablet TAKE (1) TABLET BY MOUTH ONCE DAILY. 07/27/23   Shelva Majestic, MD  aspirin EC 81 MG tablet Take 1 tablet (81 mg total) by mouth daily. Swallow whole. 12/16/23   Vassie Loll, MD  atorvastatin (LIPITOR) 40 MG tablet Take 1 tablet (40 mg total) by mouth daily. 12/16/23   Vassie Loll, MD  fluticasone (FLONASE) 50 MCG/ACT nasal spray Place 1 spray into both nostrils daily. 02/16/23   Raspet, Noberto Retort, PA-C  losartan (COZAAR) 25 MG tablet Take 1 tablet (25 mg total) by mouth daily.  12/16/23   Vassie Loll, MD  pantoprazole (PROTONIX) 40 MG tablet Take 1 tablet (40 mg total) by mouth daily. 12/16/23   Vassie Loll, MD      Allergies    Lisinopril, Statins, and Sulfa antibiotics    Review of Systems   Review of Systems  Cardiovascular:  Positive for chest pain.  All other systems reviewed and are negative.   Physical Exam Updated Vital Signs BP (!) 133/55   Pulse 95   Temp (!) 97.5 F (36.4 C) (Oral)   Resp (!) 24   Ht 5\' 3"  (1.6 m)   Wt 55.8 kg   SpO2 100%   BMI 21.79 kg/m  Physical Exam Vitals and nursing note reviewed.   Gen: Patient does appear mildly uncomfortable Eyes: PERRL, EOMI HEENT: no oropharyngeal swelling Neck: trachea midline, no obvious stridor noted Resp: clear to auscultation bilaterally Card: RRR, no murmurs, rubs, or gallops Abd: nontender, nondistended Extremities: no calf tenderness, no edema Vascular: 2+ radial pulses bilaterally, 2+ DP pulses bilaterally Skin: no rashes Psyc: acting appropriately   ED Results / Procedures / Treatments   Labs (all labs ordered are listed, but only abnormal results are displayed) Labs Reviewed - No data to display  EKG EKG Interpretation Date/Time:  Sunday January 06 2024 08:59:45 EDT Ventricular Rate:  95 PR Interval:  163 QRS Duration:  80 QT Interval:  351 QTC Calculation: 442 R Axis:  63  Text Interpretation: Sinus rhythm Probable left atrial enlargement Anterior infarct, old Borderline repolarization abnormality Confirmed by Beckey Downing [phone]) on 01/06/2024 9:08:14 AM  Radiology No results found.  Procedures Procedures  {Document cardiac monitor, telemetry assessment procedure when appropriate:1}  Medications Ordered in ED Medications  dexamethasone (DECADRON) injection 4 mg (has no administration in time range)    ED Course/ Medical Decision Making/ A&P   {   Click here for ABCD2, HEART and other calculatorsREFRESH Note before signing :1}                               Medical Decision Making 89 year old female with past medical history of GERD as well as hypertension hyperlipidemia presenting to the emergency department today with globus sensation and chest pressure.  I will further evaluate her here with basic labs Wels and EKG, chest x-ray, and troponin for further evaluation for ACS, pulmonary edema, pulmonary infiltrates, or pneumothorax.  I do not appreciate any obvious oropharyngeal swelling here on exam.  Will obtain a CT scan of her soft tissues of her neck for further evaluation for deep space infection.  I will give the patient a dose of Decadron here as well as a GI cocktail in the event this is due to GERD.  She states that the breathing treatment did not really help with her symptoms but she is feeling a little better here.  I will reevaluate for ultimate disposition.  Amount and/or Complexity of Data Reviewed Labs: ordered. Radiology: ordered.  Risk OTC drugs. Prescription drug management.   ***  {Document critical care time when appropriate:1} {Document review of labs and clinical decision tools ie heart score, Chads2Vasc2 etc:1}  {Document your independent review of radiology images, and any outside records:1} {Document your discussion with family members, caretakers, and with consultants:1} {Document social determinants of health affecting pt's care:1} {Document your decision making why or why not admission, treatments were needed:1} Final Clinical Impression(s) / ED Diagnoses Final diagnoses:  None    Rx / DC Orders ED Discharge Orders     None

## 2024-01-06 NOTE — Assessment & Plan Note (Addendum)
 89 year old presenting to ED with complaints of chest pain, shortness of breath, globus sensation and ongoing intermittent episodes since her hospital d/c on 12/15/23 for an NSTEMI, worse with exertion found to have elevated troponin and findings concerning for CHF exacerbation  -admit to progressive  -cardiology consulted. Hold on heparin with hx of +fecal occult per cardiology -decided against having further workup for +fecal occult during recent hospitalization  -repeat fecal occult  -admitted 12/13/23 for NSTEMI with conservative management. 2D echo during this stay demonstrated wall motion abnormalities; nonetheless after discussing with cardiology service they did not feel patient was best candidate for cardiac catheterization at the moment  -LpA; 66.1  -repeat echo with findings of CHF -trend troponin  -ASA, beta blockers avoided due to bradycardia, continue statin  -if fecal occult negative, will start heparin  -Had discussion regarding GOC. If she decides not to undergo further work up for +fecal occult or cardiology decides not a candidate for LHC would encourage palliative care. She is not keen on aggressive intervention which I agree with for a 89 year old  -SW consult for possible SNF placement

## 2024-01-06 NOTE — Assessment & Plan Note (Signed)
 Check right carotid US On ASA/statin

## 2024-01-06 NOTE — TOC Initial Note (Signed)
 Transition of Care [facility]) - Initial/Assessment Note    Patient Details  Name: Linda Golden MRN: [id number] Date of Birth: 1926-10-31  Transition of [facility]) CM/SW Contact:    Carmina Miller, LCSWA Phone Number: 01/06/2024, 3:05 PM  Clinical Narrative:                  CSW received consult to speak with family more about SNF, CSW spoke with pt's son, he stated now was not a good time and requested CSW follow up with him tomorrow. TOC will continue to follow.         Patient Goals and CMS Choice            Expected Discharge Plan and Services                                              Prior Living Arrangements/Services                       Activities of Daily Living      Permission Sought/Granted                  Emotional Assessment              Admission diagnosis:  NSTEMI (non-ST elevated myocardial infarction) [facility]) [I21.4] Patient Active Problem List   Diagnosis Date Noted   Acute on chronic diastolic CHF (congestive heart failure) (HCC) 01/06/2024   Normocytic anemia 01/06/2024   Memory loss 01/06/2024   Globus sensation 01/06/2024   Carotid stenosis 01/06/2024   NSTEMI (non-ST elevated myocardial infarction) (HCC) 12/13/2023   Diverticulitis of intestine with abscess 07/03/2015   CKD (chronic kidney disease), stage III (HCC) 10/23/2014   Raynaud's disease 10/08/2013   Osteoarthritis 07/01/2008   Varicose veins 11/04/2007   Controlled type 2 diabetes mellitus with renal manifestation (HCC) 04/18/2007   Hyperlipidemia associated with type 2 diabetes mellitus (HCC) 04/18/2007   Essential hypertension 04/18/2007   Osteopenia 04/18/2007   PCP:  Shelva Majestic, MD Pharmacy:   [facility] - [place], [place] - [address] [place] [address] Phone: [phone] Fax: [phone]  [facility] - [place], [place] - [address] [facility] AT Portal to Registered  [facility] [address] [place] [address] Phone: [phone] Fax: [phone]     Social Drivers of Health (SDOH) Social History: SDOH Screenings   Food Insecurity: No Food Insecurity (12/19/2023)  Housing: Low Risk  (12/19/2023)  Transportation Needs: No Transportation Needs (12/19/2023)  Utilities: Not At Risk (12/19/2023)  Depression (PHQ2-9): Low Risk  (04/16/2023)  Financial Resource Strain: Low Risk  (04/16/2023)  Physical Activity: Inactive (04/16/2023)  Social Connections: Patient Declined (12/13/2023)  Stress: No Stress Concern Present (04/16/2023)  Tobacco Use: Low Risk  (01/06/2024)   SDOH Interventions:     Readmission Risk Interventions     No data to display

## 2024-01-06 NOTE — Assessment & Plan Note (Addendum)
 Well controlled, A1C of 6.3 in 11/2023 SSI and accuchecks QAC/HS

## 2024-01-06 NOTE — Assessment & Plan Note (Signed)
 Presenting with chest pain and shortness of breath  CT chest with findings of CHF, CXR with cardiomegaly  Check BNP Start lasix IV 20mg  Strict I/O Daily weights Repeat echo in setting of possible NSTEMI

## 2024-01-06 NOTE — H&P (Signed)
 History and Physical    Patient: Linda Golden WJX:[id number] DOB: Oct 03, 1927 DOA: 01/06/2024 DOS: the patient was seen and examined on 01/06/2024 PCP: Shelva Majestic, MD  Patient coming from: Home - lives alone. Ambulates independently    Chief Complaint: chest pain   HPI: Linda Golden is a 89 y.o. female with medical history significant of T2DM, HTN, CKD stage 3, HLD who presented to ED with complaints of waking up feeling like she couldn't breath and having something stuck in her throat. She also had some chest pain and was sweating. She describes it as chest tightness that is off and on.  Family states she has been having these episodes since her hospital discharge for a NSTEMI on   . She will have chest pain, shortness of breath and feel like something is stuck in her throat. Worse with exertion.   She denies any LE swelling, weight gain (has actually lost weight) or orthopnea.    He has been feeling good. Denies any fever/chills, vision changes/headaches, palpitations, abdominal pain, N/V/D, dysuria or leg swelling.    She does not smoke or drink alcohol.   ER Course:  vitals: afebrile, bp: 133/55, HR; 95, RR: 16, oxygen: 100% simple mask >96%RA Pertinent labs: wbc: 11.7, hgb: 11.4, troponin 9>317 CXR: Lower lung volumes with left greater than right increased lung base opacity. Cardiomegaly. This could be related to small effusions, atelectasis, infection. No overt pulmonary edema. CT soft tissue neck: Negative for acute or inflammatory process identified in the Neck. 2. Positive for atherosclerotic RADIOGRAPHIC STRING SIGN STENOSIS of the proximal Right ICA, which remains patent.  CT chest without contrast: CHF, cirrhosis, enlarged pulmonic trunk, pulmonary arterial HTN.  In ED: given lasix, decadron and maalox. Cardiology consulted. TRH asked to admit.    Review of Systems: As mentioned in the history of present illness. All other systems reviewed and are negative. 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 She has never used smokeless tobacco. She reports that she does not drink alcohol and does not use drugs.  Allergies  Allergen Reactions   Lisinopril Cough   Statins Other (See Comments)    Hair loss   Sulfa Antibiotics Other (See Comments)    No reaction-patient states her family member was "burned" from this medication    Family History  Problem Relation Age of Onset   Hypertension Father    Alzheimer's disease Mother     Prior to Admission medications   Medication Sig Start Date End Date Taking? Authorizing Provider  amLODipine (NORVASC) 2.5 MG tablet TAKE (1) TABLET BY MOUTH ONCE DAILY. Patient taking differently: Take 2.5 mg by mouth. 07/27/23   Shelva Majestic, MD  aspirin EC 81 MG tablet Take 1 tablet (81 mg total) by mouth daily. Swallow whole. 12/16/23   Vassie Loll, MD  atorvastatin (LIPITOR) 40 MG tablet Take 1 tablet (40 mg total) by mouth daily. 12/16/23   Vassie Loll, MD  fluticasone (FLONASE) 50 MCG/ACT nasal spray Place 1 spray into both nostrils  daily. 02/16/23   Raspet, Denny Peon K, PA-C  losartan (COZAAR) 25 MG tablet Take 1 tablet (25 mg total) by mouth daily. 12/16/23   Vassie Loll, MD  pantoprazole (PROTONIX) 40 MG tablet Take 1 tablet (40 mg total) by mouth daily. 12/16/23   Vassie Loll, MD    Physical Exam: Vitals:   01/06/24 1300 01/06/24 1315 01/06/24 1330 01/06/24 1400  BP: (!) 141/51 (!) 132/57 (!) 132/55 (!) 143/72  Pulse: 87 74 74 98  Resp: (!) 22 (!) 25 (!) 28 (!) 22  Temp:      TempSrc:      SpO2: 95% 94% 96% 96%  Weight:      Height:       General:   Appears calm and comfortable and is in NAD Eyes:  PERRL, EOMI, normal lids, iris ENT:  HOH lips & tongue, mmm; appropriate dentition Neck:  no LAD, masses or thyromegaly; no carotid bruits Cardiovascular:  RRR, no m/r/g. No LE edema.  Respiratory:   CTA bilaterally with no wheezes/rales/rhonchi.  Normal respiratory effort. Abdomen:  soft, NT, ND, NABS Back:   normal alignment, no CVAT Skin:  no rash or induration seen on limited exam Musculoskeletal:  grossly normal tone BUE/BLE, good ROM, no bony abnormality Lower extremity:  No LE edema.  Limited foot exam with no ulcerations.  2+ distal pulses. Psychiatric:  grossly normal mood and affect, speech fluent and appropriate, AOx2 Neurologic:  CN 2-12 grossly intact, moves all extremities in coordinated fashion, sensation intact   Radiological Exams on Admission: Independently reviewed - see discussion in A/P where applicable  CT Soft Tissue Neck W Contrast Result Date: 01/06/2024 CLINICAL DATA:  89 year old female with shortness of breath, complaining of mucus in her throat. EXAM: CT NECK WITH CONTRAST TECHNIQUE: Multidetector CT imaging of the neck was performed using the standard protocol following the bolus administration of intravenous contrast. RADIATION DOSE REDUCTION: This exam was performed according to the departmental dose-optimization program which includes automated exposure control, adjustment of the mA and/or kV according to patient size and/or use of iterative reconstruction technique. CONTRAST:  75mL OMNIPAQUE IOHEXOL 350 MG/ML SOLN COMPARISON:  Chest CT today reported separately. FINDINGS: Pharynx and larynx: Larynx and pharynx soft tissue contours are within normal limits. Normal epiglottis (sagittal image 42). Mild postinflammatory calcification of the left palatine tonsil. Parapharyngeal and retropharyngeal spaces appear normal. Salivary glands: Negative.  Negative sublingual space. Thyroid: Negative for age. Lymph nodes:  Negative.  No cervical lymphadenopathy. Vascular: Major vascular structures in the neck and at the skull base are patent. However, there is severe atherosclerosis and radiographic string sign stenosis at the proximal right ICA (series 3, image 46 and sagittal image 26). This is over a segment of about 6 mm. But that vessel does remain patent. Limited intracranial: Extensive Calcified atherosclerosis at the skull base. Negative visible brain parenchyma. Visualized orbits: Postoperative changes to both globes. Mastoids and visualized paranasal sinuses: Visualized paranasal sinuses and mastoids are well aerated. Skeleton: No acute osseous abnormality identified. Age-appropriate cervical spine degeneration. Upper chest: Dedicated chest CT is reported separately. IMPRESSION: 1. Negative for acute or inflammatory process identified in the Neck. 2. Positive for atherosclerotic RADIOGRAPHIC STRING SIGN STENOSIS of the proximal Right ICA, which remains patent. Electronically Signed   By: Odessa Fleming M.D.   On: 01/06/2024 12:23   CT CHEST WO CONTRAST Result Date: 01/06/2024 CLINICAL DATA:  Lung nodule. EXAM: CT CHEST WITHOUT CONTRAST TECHNIQUE: Multidetector CT imaging of the chest was performed following the  standard protocol without IV contrast. RADIATION DOSE REDUCTION: This exam was performed according to the departmental dose-optimization program which includes automated exposure control, adjustment of the mA and/or kV according to patient size and/or use of iterative reconstruction technique. COMPARISON:  Chest radiograph 01/06/2024. FINDINGS: Cardiovascular: Atherosclerotic calcification of the aorta, aortic valve and coronary arteries. Enlarged pulmonic trunk and heart. No pericardial effusion. Mediastinum/Nodes: No pathologically enlarged mediastinal or axillary lymph nodes. Hilar regions are difficult to definitively evaluate without IV contrast. Esophagus is grossly unremarkable. Lungs/Pleura: Image quality is  degraded by expiratory phase imaging. Smooth septal thickening. Small juxtapleural nodule along the left major fissure, considered benign. Moderate bilateral pleural effusions with compressive atelectasis in both lower lobes. Upper Abdomen: Liver margin is irregular. Visualized portions of the liver, adrenal glands, spleen and stomach are otherwise grossly unremarkable. Musculoskeletal: Degenerative changes in the spine. Flowing anterior osteophytosis in the thoracic spine. Kyphosis. IMPRESSION: 1. Congestive heart failure. 2. Cirrhosis. 3. Aortic atherosclerosis (ICD10-I70.0). Coronary artery calcification. 4. Enlarged pulmonic trunk, indicative of pulmonary arterial hypertension. Electronically Signed   By: Leanna Battles M.D.   On: 01/06/2024 12:17   DG Chest Port 1 View Result Date: 01/06/2024 CLINICAL DATA:  89 year old female with shortness of breath. EXAM: PORTABLE CHEST 1 VIEW COMPARISON:  Portable chest 12/13/2023 and earlier. FINDINGS: Portable AP upright view at 0943 hours. Cardiomegaly and mediastinal contours not significantly changed. Lower lung volumes. Patchy left greater than right lung base opacity has increased. No pneumothorax. Pulmonary vascularity only mildly increased. No overt edema. No definite consolidation. Paucity of bowel gas.  No acute osseous abnormality identified. IMPRESSION: Lower lung volumes with left greater than right increased lung base opacity. Cardiomegaly. This could be related to small effusions, atelectasis, infection. No overt pulmonary edema. Electronically Signed   By: Odessa Fleming M.D.   On: 01/06/2024 09:50    EKG: Independently reviewed.  NSR with rate 95; nonspecific ST changes with no evidence of acute ischemia   Labs on Admission: I have personally reviewed the available labs and imaging studies at the time of the admission.  Pertinent labs:   wbc: 11.7,  hgb: 11.4,  troponin 9>317  Assessment and Plan: Principal Problem:   NSTEMI (non-ST elevated  myocardial infarction) (HCC) Active Problems:   Acute on chronic diastolic CHF (congestive heart failure) (HCC)   Globus sensation   Normocytic anemia   Memory loss   Controlled type 2 diabetes mellitus with renal manifestation (HCC)   Essential hypertension   CKD (chronic kidney disease), stage III (HCC)   Hyperlipidemia associated with type 2 diabetes mellitus (HCC)   Carotid stenosis    Assessment and Plan: * NSTEMI (non-ST elevated myocardial infarction) (HCC) 89 year old presenting to ED with complaints of chest pain, shortness of breath, globus sensation and ongoing intermittent episodes since her hospital d/c on 12/15/23 for an NSTEMI, worse with exertion found to have elevated troponin and findings concerning for CHF exacerbation  -admit to progressive  -cardiology consulted. Hold on heparin with hx of +fecal occult per cardiology -decided against having further workup for +fecal occult during recent hospitalization  -repeat fecal occult  -admitted 12/13/23 for NSTEMI with conservative management. 2D echo during this stay demonstrated wall motion abnormalities; nonetheless after discussing with cardiology service they did not feel patient was best candidate for cardiac catheterization at the moment  -LpA; 66.1  -repeat echo with findings of CHF -trend troponin  -ASA, beta blockers avoided due to bradycardia, continue statin  -if fecal occult negative, will  start heparin  -Had discussion regarding GOC. If she decides not to undergo further work up for +fecal occult or cardiology decides not a candidate for LHC would encourage palliative care. She is not keen on aggressive intervention which I agree with for a 89 year old  -SW consult for possible SNF placement    Acute on chronic diastolic CHF (congestive heart failure) (HCC) Presenting with chest pain and shortness of breath  CT chest with findings of CHF, CXR with cardiomegaly  Check BNP Start lasix IV 20mg  Strict I/O Daily  weights Repeat echo in setting of possible NSTEMI   Globus sensation Unsure if related to chest pain symptoms vs. GERD vs. Dysphagia Cardiac w/u see #1/2 Continue PPI SLP for swallow eval  Aspiration precautions   Normocytic anemia Hgb appears to be stable.  +fecal occult during recent hospitalization  Denies any bleeding Check iron studies Repeat fecal occult  Trend   Memory loss No diagnosis of dementia, but does seem to have memory loss Can not tell me what happened at previous hospital stay Family states has been having  memory issues at home  Delirium precautions  SW consult for continued discussion regarding SNF placement   Controlled type 2 diabetes mellitus with renal manifestation (HCC) Well controlled, A1C of 6.3 in 11/2023 SSI and accuchecks QAC/HS    Essential hypertension Continue cozaar 25mg  daily Hold norvasc  No Beta blocker due to bradycardia   CKD (chronic kidney disease), stage III (HCC) Stable. Baseline creatinine 1.0-1.1 continue to monitor   Hyperlipidemia associated with type 2 diabetes mellitus (HCC) Continue lipitor 40mg    Carotid stenosis Check right carotid US On ASA/statin     Advance Care Planning:   Code Status: Limited: Do not attempt resuscitation (DNR) -DNR-LIMITED -Do Not Intubate/DNI    Consults: cardiology   DVT Prophylaxis: SCDs   Family Communication: son, granddaughter and grand daughter in law at bedside   Severity of Illness: The appropriate patient status for this patient is INPATIENT. Inpatient status is judged to be reasonable and necessary in order to provide the required intensity of service to ensure the patient's safety. The patient's presenting symptoms, physical exam findings, and initial radiographic and laboratory data in the context of their chronic comorbidities is felt to place them at high risk for further clinical deterioration. Furthermore, it is not anticipated that the patient will be medically stable  for discharge from the hospital within 2 midnights of admission.   * I certify that at the point of admission it is my clinical judgment that the patient will require inpatient hospital care spanning beyond 2 midnights from the point of admission due to high intensity of service, high risk for further deterioration and high frequency of surveillance required.*  Author: Orland Mustard, MD 01/06/2024 3:06 PM  For on call review [email or web address].

## 2024-01-06 NOTE — Assessment & Plan Note (Signed)
 Unsure if related to chest pain symptoms vs. GERD vs. Dysphagia Cardiac w/u see #1/2 Continue PPI SLP for swallow eval  Aspiration precautions

## 2024-01-06 NOTE — Assessment & Plan Note (Signed)
 No diagnosis of dementia, but does seem to have memory loss Can not tell me what happened at previous hospital stay Family states has been having  memory issues at home  Delirium precautions  SW consult for continued discussion regarding SNF placement

## 2024-01-06 NOTE — Assessment & Plan Note (Addendum)
 Continue cozaar 25mg  daily Hold norvasc  No Beta blocker due to bradycardia

## 2024-01-06 NOTE — ED Triage Notes (Signed)
 Pt arrives via [facility] from home with reports of waking up feeling clammy and SOB. EMS reports pt O2 was 90% RA and complaining of mucus in her throat. They nebulized some saline to help clear her throat. EMS reports frequent PACs and PVCs that improved during transport. Also EMS gave 35 mcg fentanyl for CP. Denies pain at this time.

## 2024-01-06 NOTE — Assessment & Plan Note (Signed)
 Hgb appears to be stable.  +fecal occult during recent hospitalization  Denies any bleeding Check iron studies Repeat fecal occult  Trend

## 2024-01-06 NOTE — Assessment & Plan Note (Signed)
Continue lipitor 40mg  

## 2024-01-07 ENCOUNTER — Inpatient Hospital Stay (HOSPITAL_COMMUNITY)

## 2024-01-07 DIAGNOSIS — I6521 Occlusion and stenosis of right carotid artery: Secondary | ICD-10-CM | POA: Diagnosis not present

## 2024-01-07 DIAGNOSIS — I214 Non-ST elevation (NSTEMI) myocardial infarction: Secondary | ICD-10-CM

## 2024-01-07 LAB — BASIC METABOLIC PANEL
Anion gap: 6 (ref 5–15)
BUN: 25 mg/dL — ABNORMAL HIGH (ref 8–23)
CO2: 27 mmol/L (ref 22–32)
Calcium: 8.7 mg/dL — ABNORMAL LOW (ref 8.9–10.3)
Chloride: 109 mmol/L (ref 98–111)
Creatinine, Ser: 1.35 mg/dL — ABNORMAL HIGH (ref 0.44–1.00)
GFR, Estimated: 36 mL/min — ABNORMAL LOW (ref >60)
Glucose, Bld: 136 mg/dL — ABNORMAL HIGH (ref 70–99)
Potassium: 4.7 mmol/L (ref 3.5–5.1)
Sodium: 142 mmol/L (ref 135–145)

## 2024-01-07 LAB — ECHOCARDIOGRAM COMPLETE
AR max vel: 1.56 cm2
AV Area VTI: 1.59 cm2
AV Area mean vel: 1.48 cm2
AV Mean grad: 7 mmHg
AV Peak grad: 13.1 mmHg
Ao pk vel: 1.81 m/s
Area-P 1/2: 4.99 cm2
Calc EF: 52.1 %
Height: 63 in
MV M vel: 5.81 m/s
MV Peak grad: 135 mmHg
MV VTI: 2.15 cm2
S' Lateral: 2.6 cm
Single Plane A2C EF: 51.5 %
Single Plane A4C EF: 52.7 %
Weight: 1809.54 [oz_av]

## 2024-01-07 LAB — CBC
HCT: 35.6 % — ABNORMAL LOW (ref 36.0–46.0)
Hemoglobin: 11.4 g/dL — ABNORMAL LOW (ref 12.0–15.0)
MCH: 29 pg (ref 26.0–34.0)
MCHC: 32 g/dL (ref 30.0–36.0)
MCV: 90.6 fL (ref 80.0–100.0)
Platelets: 194 10*3/uL (ref 150–400)
RBC: 3.93 MIL/uL (ref 3.87–5.11)
RDW: 12.6 % (ref 11.5–15.5)
WBC: 9.7 10*3/uL (ref 4.0–10.5)
nRBC: 0 % (ref 0.0–0.2)

## 2024-01-07 LAB — GLUCOSE, CAPILLARY
Glucose-Capillary: 109 mg/dL — ABNORMAL HIGH (ref 70–99)
Glucose-Capillary: 107 mg/dL — ABNORMAL HIGH (ref 70–99)
Glucose-Capillary: 172 mg/dL — ABNORMAL HIGH (ref 70–99)
Glucose-Capillary: 111 mg/dL — ABNORMAL HIGH (ref 70–99)

## 2024-01-07 LAB — HEPARIN LEVEL (UNFRACTIONATED): Heparin Unfractionated: <0.1 [IU]/mL — ABNORMAL LOW (ref 0.30–0.70)

## 2024-01-07 MED ORDER — HEPARIN (PORCINE) 25000 UT/250ML-% IV SOLN
750.0000 [IU]/h | INTRAVENOUS | Status: DC
  Administered 2024-01-07: 550 [IU]/h via INTRAVENOUS
  Filled 2024-01-07: qty 250

## 2024-01-07 NOTE — Progress Notes (Signed)
 Right carotid duplex has been completed.   Results can be found under chart review under CV PROC. 01/07/2024 12:32 PM Sabrina Arriaga RVT, RDMS

## 2024-01-07 NOTE — Consult Note (Addendum)
 Cardiology Consultation   Patient ID: CHARLESTON VIERLING MRN: [id number]; DOB: 1926/12/05  Admit date: 01/06/2024 Date of Consult: 01/07/2024  PCP:  Shelva Majestic, MD   [facility] Providers Cardiologist:  None        Patient Profile:   Linda Golden is a 89 y.o. female with a hx of T2DM, HTN, CKD stage 3, HLD who is being seen 01/07/2024 for the evaluation of NSTEMI  at the request of Orland Mustard, MD.  History of Present Illness:   Linda Golden presented to the hospital on 3/23 for dysphagia and throat tightness.  She denied having chest pain during this time and also has denied having chest pain during this hospitalization.  She stated that the throat tightness made her feel like she could not breathe especially when she is laying flat.  Patient also stated that she has been having a cough.  She was recently hospitalized from 2/27 to 12/15/2023 for an NSTEMI where she was treated medically with aspirin, atorvastatin, heparin drip, losartan.  She was not started on a beta-blocker at that time due to bradycardia nor which she started on Plavix due to anemia.    There were concerns of anemia during her prior hospitalization due to a hemoglobin drop from 13.5-10.8 with the presence of a positive FOBT on 2/26. She denies Hematochezia and melena.    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 Authorizing Provider  amLODipine (NORVASC) 2.5 MG tablet TAKE (1) TABLET BY MOUTH ONCE DAILY. Patient taking differently: Take 2.5 mg by mouth. 07/27/23  Yes Shelva Majestic, MD  aspirin EC 81  MG tablet Take 1 tablet (81 mg total) by mouth daily. Swallow whole. 12/16/23  Yes Vassie Loll, MD  atorvastatin (LIPITOR) 40 MG tablet Take 1 tablet (40 mg total) by mouth daily. 12/16/23  Yes Vassie Loll, MD  fluticasone [facility]) 50 MCG/ACT nasal spray Place 1 spray into both nostrils daily. Patient taking differently: Place 1 spray into both nostrils as needed for allergies. 02/16/23  Yes Raspet, Erin K, PA-C  losartan (COZAAR) 25 MG tablet Take 1 tablet (25 mg total) by mouth daily. 12/16/23  Yes Vassie Loll, MD  pantoprazole (PROTONIX) 40 MG tablet Take 1 tablet (40 mg total) by mouth daily. 12/16/23  Yes Vassie Loll, MD    Inpatient Medications: Scheduled Meds:  aspirin EC  81 mg Oral Daily   atorvastatin  40 mg Oral Daily   furosemide  20 mg Intravenous Daily   insulin aspart  0-9 Units Subcutaneous TID WC   losartan  25 mg Oral Daily   pantoprazole (PROTONIX) IV  40 mg Intravenous Q24H    PRN Meds: acetaminophen **OR** acetaminophen, docusate sodium, ondansetron **OR** ondansetron (ZOFRAN) IV  Allergies:    Allergies  Allergen Reactions   Lisinopril Cough   Statins Other (See Comments)    Hair loss   Sulfa Antibiotics Other (See Comments)    No reaction-patient states her family  member was "burned" from this medication    Social History:   Social History   Socioeconomic History   Marital status: Widowed    Spouse name: Not on file   Number of children: Not on file   Years of education: Not on file   Highest education level: Not on file  Occupational History   Not on file  Tobacco Use   Smoking status: Never   Smokeless tobacco: Never  Vaping Use   Vaping status: Never Used  Substance and Sexual Activity   Alcohol use: No   Drug use: No   Sexual activity: Not Currently  Other Topics Concern   Not on file  Social History Narrative   Widowed 2004. 1 son. 2 adopted grandchildren, 2 greatgrandkids. Assists in the care of autistic grandchild    [place]. Son lives close as well as grandson (next door). Completely independent. Still mows her yard.       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       Hobbies: reading, work outside      [employer]: Low Risk  (04/16/2023)   Overall Financial Resource Strain (CARDIA)    Difficulty of Paying Living Expenses: Not hard at all  Food Insecurity: No Food Insecurity (01/06/2024)   Hunger Vital Sign    Worried About Running Out of Food in the Last Year: Never true    Ran Out of Food in the Last Year: Never true  Transportation Needs: No Transportation Needs (01/06/2024)   PRAPARE - Administrator, Civil Service (Medical): No    Lack of Transportation (Non-Medical): No  Physical Activity: Inactive (04/16/2023)   Exercise Vital Sign    Days of Exercise per Week: 0 days    Minutes of Exercise per Session: 0 min  Stress: No Stress Concern Present (04/16/2023)   [employer] of Occupational Health - Occupational Stress Questionnaire    Feeling of Stress : Not at all  Social Connections: Moderately Integrated (01/06/2024)   Social Connection and Isolation Panel [NHANES]    Frequency of Communication with Friends and Family: More than three times a week    Frequency of Social Gatherings with Friends and Family: More than three times a week    Attends Religious Services: More than 4 times per year    Active Member of [employer] or Organizations: Yes    Attends Banker Meetings: More than 4 times per year    Marital Status: Widowed  Intimate Partner Violence: Not At Risk (01/06/2024)   Humiliation, Afraid, Rape, and Kick questionnaire    Fear of Current or Ex-Partner: No    Emotionally Abused: No    Physically Abused: No    Sexually Abused: No    Family History:    Family History  Problem Relation Age of Onset   Hypertension Father    Alzheimer's disease Mother      ROS:  Please see the history of present illness.  All  other ROS reviewed and negative.     Physical Exam/Data:   Vitals:   01/07/24 0413 01/07/24 0523 01/07/24 0835 01/07/24 1137  BP: (!) 112/58  122/61 (!) 117/45  Pulse: 65  82 64  Resp: 20  19 20   Temp: 99.1 F (37.3 C)  99.8 F (37.7 C) 98.8 F (37.1 C)  TempSrc: Oral  Oral Oral  SpO2: 91%  96% 95%  Weight:  51.3 kg  Height:        Intake/Output Summary (Last 24 hours) at 01/07/2024 1256 Last data filed at 01/07/2024 0930 Gross per 24 hour  Intake 800 ml  Output 1100 ml  Net -300 ml      01/07/2024    5:23 AM 01/06/2024    8:59 AM 12/14/2023    5:00 AM  Last 3 Weights  Weight (lbs) 113 lb 1.5 oz 123 lb 0.3 oz 123 lb 0.3 oz  Weight (kg) 51.3 kg 55.8 kg 55.8 kg     Body mass index is 20.03 kg/m.  General:  In no acute distress, elderly, thin with kyphosis  HEENT: normal Cardiac:  normal S1, S2; RRR; no murmur  Lungs:  clear to auscultation bilaterally, no wheezing, rhonchi or rales  Ext: no edema Skin: warm and dry  Psych:  Normal mood and affect   EKG:  The EKG was personally reviewed and demonstrates: Sinus rhythm without significant ST segment changes Telemetry:  Telemetry was personally reviewed and demonstrates: sinus rhythm   Relevant CV Studies: Echocardiogram: showed LV global hypokinesis with akinesis of the inferior wall with new HFrEF, LVEF of 30-35%  Echocardiogram on 2/27: The basal inferior segment and basal inferoseptal segment are hypokinetic. The entire anterior wall, entire lateral wall, entire anterior septum, entire apex, mid and distal inferior wall, and mid inferoseptal segment are normal.   Laboratory Data:  High Sensitivity Troponin:   Recent Labs  Lab 12/13/23 1210 01/06/24 0901 01/06/24 1228 01/06/24 1452 01/06/24 1716  TROPONINIHS 674* 89* 317* 378* 433*     Chemistry Recent Labs  Lab 01/06/24 0901 01/06/24 1452 01/07/24 0528  NA 143  --  142  K 3.8  --  4.7  CL 109  --  109  CO2 22  --  27  GLUCOSE 182*  --  136*  BUN  22  --  25*  CREATININE 1.03*  --  1.35*  CALCIUM 8.7*  --  8.7*  MG  --  2.1  --   GFRNONAA 50*  --  36*  ANIONGAP 12  --  6    No results for input(s): "PROT", "ALBUMIN", "AST", "ALT", "ALKPHOS", "BILITOT" in the last 168 hours. Lipids No results for input(s): "CHOL", "TRIG", "HDL", "LABVLDL", "LDLCALC", "CHOLHDL" in the last 168 hours.  Hematology Recent Labs  Lab 01/06/24 0901 01/07/24 0528  WBC 11.7* 9.7  RBC 3.98 3.93  HGB 11.4* 11.4*  HCT 38.4 35.6*  MCV 96.5 90.6  MCH 28.6 29.0  MCHC 29.7* 32.0  RDW 12.5 12.6  PLT 183 194   Thyroid No results for input(s): "TSH", "FREET4" in the last 168 hours.  BNP Recent Labs  Lab 01/06/24 1452  BNP 2,396.4*    DDimer No results for input(s): "DDIMER" in the last 168 hours.   Radiology/Studies:  VAS US CAROTID Result Date: 01/07/2024 Carotid Arterial Duplex Study Patient Name:  Linda Golden  Date of Exam:   01/07/2024 Medical Rec #: [id number]      Accession #:    [id number] Date of Birth: [birth year]       Patient Gender: F Patient Age:   54 years Exam Location:  [facility] Procedure:      VAS US CAROTID Referring Phys: Orland Mustard --------------------------------------------------------------------------------  Indications:       Stenosis of RT ICA seen on soft tissue CT exam. Risk Factors:      Hypertension, hyperlipidemia, Diabetes, no history of  smoking, prior MI. Other Factors:     CKD, CHF. Comparison Study:  No previous exams Performing Technologist: Jody Hill RVT, RDMS  Examination Guidelines: A complete evaluation includes B-mode imaging, spectral Doppler, color Doppler, and power Doppler as needed of all accessible portions of each vessel. Bilateral testing is considered an integral part of a complete examination. Limited examinations for reoccurring indications may be performed as noted.  Right Carotid Findings: +----------+--------+--------+--------+------------------+--------+           PSV  cm/sEDV cm/sStenosisPlaque DescriptionComments +----------+--------+--------+--------+------------------+--------+ CCA Prox  70      3                                          +----------+--------+--------+--------+------------------+--------+ CCA Distal65      6                                          +----------+--------+--------+--------+------------------+--------+ ICA Prox  554     132     80-99%  hypoechoic        tortuous +----------+--------+--------+--------+------------------+--------+ ICA Mid   359     67                                tortuous +----------+--------+--------+--------+------------------+--------+ ICA Distal80      20                                         +----------+--------+--------+--------+------------------+--------+ ECA       134     0                                          +----------+--------+--------+--------+------------------+--------+ +----------+--------+-------+----------------+-------------------+           PSV cm/sEDV cmsDescribe        Arm Pressure (mmHG) +----------+--------+-------+----------------+-------------------+ Subclavian226            Multiphasic, WNL                    +----------+--------+-------+----------------+-------------------+ +---------+--------+--+--------+--+---------+ VertebralPSV cm/s78EDV cm/s20Antegrade +---------+--------+--+--------+--+---------+   Summary: Right Carotid: Velocities in the right ICA are consistent with a 80-99%                stenosis, with evidence of string sign. Vertebrals:  Right vertebral artery demonstrates antegrade flow. Subclavians: Normal flow hemodynamics were seen in the right subclavian artery. *See table(s) above for measurements and observations.  Suggest Peripheral Vascular Consult.    Preliminary    ECHOCARDIOGRAM COMPLETE Result Date: 01/07/2024    ECHOCARDIOGRAM REPORT   Patient Name:   Linda Golden Date of Exam: 01/07/2024 Medical Rec #:   [id number]     Height:       63.0 in Accession #:    [id number]    Weight:       113.1 lb Date of Birth:  [birth year]      BSA:          1.518 m Patient Age:    89 years      BP:  122/61 mmHg Patient Gender: F             HR:           80 bpm. Exam Location:  Inpatient Procedure: 2D Echo, 3D Echo, Cardiac Doppler, Color Doppler and Strain Analysis            (Both Spectral and Color Flow Doppler were utilized during            procedure). Indications:    NSTEMI  History:        Patient has prior history of Echocardiogram examinations, most                 recent 12/13/2023. Acute MI; Risk Factors:Dyslipidemia, Diabetes                 and Hypertension. Carotid stenosis, CKD stage 3.  Sonographer:    Vern Claude Referring Phys: [id number] ALLISON WOLFE IMPRESSIONS  1. Left ventricular ejection fraction, by estimation, is 30 to 35%. The left ventricle has moderately decreased function. The left ventricle demonstrates global hypokinesis with akinesis of the inferior wall. There is mild concentric left ventricular hypertrophy. Left ventricular diastolic parameters are consistent with Grade III diastolic dysfunction (restrictive). The average left ventricular global longitudinal strain is -15.3 %.  2. Right ventricular systolic function is moderately reduced. The right ventricular size is normal. There is mildly elevated pulmonary artery systolic pressure.  3. Left atrial size was mildly dilated.  4. The mitral valve is normal in structure. No evidence of mitral valve regurgitation. No evidence of mitral stenosis.  5. By Doppler AS appears mild. But visually AS appears quite severe. Suspect moderate to severe low-gradient, low-flow AS. DI 0.50. The aortic valve is tricuspid. There is moderate calcification of the aortic valve. Aortic valve regurgitation is not visualized. Moderate to severe aortic valve stenosis. Aortic valve area, by VTI measures 1.59 cm. Aortic valve mean gradient measures 7.0 mmHg. Aortic valve  Vmax measures 1.81 m/s.  6. The inferior vena cava is normal in size with greater than 50% respiratory variability, suggesting right atrial pressure of 3 mmHg. FINDINGS  Left Ventricle: Left ventricular ejection fraction, by estimation, is 30 to 35%. The left ventricle has moderately decreased function. The left ventricle demonstrates global hypokinesis. The average left ventricular global longitudinal strain is -15.3 %. Strain was performed and the global longitudinal strain is indeterminate. The left ventricular internal cavity size was normal in size. There is mild concentric left ventricular hypertrophy. Left ventricular diastolic parameters are consistent with Grade III diastolic dysfunction (restrictive). Right Ventricle: The right ventricular size is normal. No increase in right ventricular wall thickness. Right ventricular systolic function is moderately reduced. There is mildly elevated pulmonary artery systolic pressure. The tricuspid regurgitant velocity is 3.06 m/s, and with an assumed right atrial pressure of 3 mmHg, the estimated right ventricular systolic pressure is 40.5 mmHg. Left Atrium: Left atrial size was mildly dilated. Right Atrium: Right atrial size was normal in size. Pericardium: There is no evidence of pericardial effusion. Mitral Valve: The mitral valve is normal in structure. There is mild calcification of the mitral valve leaflet(s). No evidence of mitral valve regurgitation. No evidence of mitral valve stenosis. MV peak gradient, 5.8 mmHg. The mean mitral valve gradient  is 2.0 mmHg. Tricuspid Valve: The tricuspid valve is normal in structure. Tricuspid valve regurgitation is mild . No evidence of tricuspid stenosis. Aortic Valve: By Doppler AS appears mild. But visually AS appears quite severe. Suspect moderate to severe  low-gradient, low-flow AS. DI 0.50. The aortic valve is tricuspid. There is moderate calcification of the aortic valve. Aortic valve regurgitation is not visualized.  Moderate to severe aortic stenosis is present. Aortic valve mean gradient measures 7.0 mmHg. Aortic valve peak gradient measures 13.1 mmHg. Aortic valve area, by VTI measures 1.59 cm. Pulmonic Valve: The pulmonic valve was normal in structure. Pulmonic valve regurgitation is trivial. No evidence of pulmonic stenosis. Aorta: The aortic root is normal in size and structure. Venous: The inferior vena cava is normal in size with greater than 50% respiratory variability, suggesting right atrial pressure of 3 mmHg. IAS/Shunts: No atrial level shunt detected by color flow Doppler. Additional Comments: 3D was performed not requiring image post processing on an independent workstation and was indeterminate.  LEFT VENTRICLE PLAX 2D LVIDd:         3.60 cm      Diastology LVIDs:         2.60 cm      LV e' medial:    4.03 cm/s LV PW:         0.80 cm      LV E/e' medial:  32.5 LV IVS:        1.10 cm      LV e' lateral:   6.53 cm/s LVOT diam:     2.00 cm      LV E/e' lateral: 20.1 LV SV:         60 LV SV Index:   39           2D Longitudinal Strain LVOT Area:     3.14 cm     2D Strain GLS Avg:     -15.3 %  LV Volumes (MOD) LV vol d, MOD A2C: 88.1 ml  3D Volume EF: LV vol d, MOD A4C: 116.0 ml 3D EF:        50 % LV vol s, MOD A2C: 42.7 ml  LV EDV:       163 ml LV vol s, MOD A4C: 54.9 ml  LV ESV:       82 ml LV SV MOD A2C:     45.4 ml  LV SV:        81 ml LV SV MOD A4C:     116.0 ml LV SV MOD BP:      52.8 ml RIGHT VENTRICLE            IVC RV Basal diam:  3.20 cm    IVC diam: 1.60 cm RV Mid diam:    2.60 cm RV S prime:     9.79 cm/s TAPSE (M-mode): 1.5 cm LEFT ATRIUM             Index        RIGHT ATRIUM           Index LA diam:        4.50 cm 2.97 cm/m   RA Area:     11.40 cm LA Vol (A2C):   40.5 ml 26.69 ml/m  RA Volume:   23.70 ml  15.62 ml/m LA Vol (A4C):   51.0 ml 33.61 ml/m LA Biplane Vol: 45.5 ml 29.98 ml/m  AORTIC VALVE                     PULMONIC VALVE AV Area (Vmax):    1.56 cm      PV Vmax:       0.65 m/s AV Area  (Vmean):   1.48  cm      PV Peak grad:  1.7 mmHg AV Area (VTI):     1.59 cm AV Vmax:           181.00 cm/s AV Vmean:          121.000 cm/s AV VTI:            0.376 m AV Peak Grad:      13.1 mmHg AV Mean Grad:      7.0 mmHg LVOT Vmax:         89.80 cm/s LVOT Vmean:        56.900 cm/s LVOT VTI:          0.190 m LVOT/AV VTI ratio: 0.51  AORTA Ao Root diam: 2.80 cm Ao Asc diam:  2.80 cm MITRAL VALVE                TRICUSPID VALVE MV Area (PHT): 4.99 cm     TR Peak grad:   37.5 mmHg MV Area VTI:   2.15 cm     TR Vmax:        306.00 cm/s MV Peak grad:  5.8 mmHg MV Mean grad:  2.0 mmHg     SHUNTS MV Vmax:       1.20 m/s     Systemic VTI:  0.19 m MV Vmean:      65.6 cm/s    Systemic Diam: 2.00 cm MV Decel Time: 152 msec MR Peak grad: 135.0 mmHg MR Mean grad: 80.0 mmHg MR Vmax:      581.00 cm/s MR Vmean:     413.0 cm/s MV E velocity: 131.00 cm/s MV A velocity: 64.90 cm/s MV E/A ratio:  2.02 Arvilla Meres MD Electronically signed by Arvilla Meres MD Signature Date/Time: 01/07/2024/10:46:15 AM    Final    CT Soft Tissue Neck W Contrast Result Date: 01/06/2024 CLINICAL DATA:  89 year old female with shortness of breath, complaining of mucus in her throat. EXAM: CT NECK WITH CONTRAST TECHNIQUE: Multidetector CT imaging of the neck was performed using the standard protocol following the bolus administration of intravenous contrast. RADIATION DOSE REDUCTION: This exam was performed according to the departmental dose-optimization program which includes automated exposure control, adjustment of the mA and/or kV according to patient size and/or use of iterative reconstruction technique. CONTRAST:  75mL OMNIPAQUE IOHEXOL 350 MG/ML SOLN COMPARISON:  Chest CT today reported separately. FINDINGS: Pharynx and larynx: Larynx and pharynx soft tissue contours are within normal limits. Normal epiglottis (sagittal image 42). Mild postinflammatory calcification of the left palatine tonsil. Parapharyngeal and retropharyngeal spaces  appear normal. Salivary glands: Negative.  Negative sublingual space. Thyroid: Negative for age. Lymph nodes: Negative.  No cervical lymphadenopathy. Vascular: Major vascular structures in the neck and at the skull base are patent. However, there is severe atherosclerosis and radiographic string sign stenosis at the proximal right ICA (series 3, image 46 and sagittal image 26). This is over a segment of about 6 mm. But that vessel does remain patent. Limited intracranial: Extensive Calcified atherosclerosis at the skull base. Negative visible brain parenchyma. Visualized orbits: Postoperative changes to both globes. Mastoids and visualized paranasal sinuses: Visualized paranasal sinuses and mastoids are well aerated. Skeleton: No acute osseous abnormality identified. Age-appropriate cervical spine degeneration. Upper chest: Dedicated chest CT is reported separately. IMPRESSION: 1. Negative for acute or inflammatory process identified in the Neck. 2. Positive for atherosclerotic RADIOGRAPHIC STRING SIGN STENOSIS of the proximal Right ICA, which remains patent. Electronically Signed   By: Rexene Edison  Margo Aye M.D.   On: 01/06/2024 12:23   CT CHEST WO CONTRAST Result Date: 01/06/2024 CLINICAL DATA:  Lung nodule. EXAM: CT CHEST WITHOUT CONTRAST TECHNIQUE: Multidetector CT imaging of the chest was performed following the standard protocol without IV contrast. RADIATION DOSE REDUCTION: This exam was performed according to the departmental dose-optimization program which includes automated exposure control, adjustment of the mA and/or kV according to patient size and/or use of iterative reconstruction technique. COMPARISON:  Chest radiograph 01/06/2024. FINDINGS: Cardiovascular: Atherosclerotic calcification of the aorta, aortic valve and coronary arteries. Enlarged pulmonic trunk and heart. No pericardial effusion. Mediastinum/Nodes: No pathologically enlarged mediastinal or axillary lymph nodes. Hilar regions are difficult to  definitively evaluate without IV contrast. Esophagus is grossly unremarkable. Lungs/Pleura: Image quality is degraded by expiratory phase imaging. Smooth septal thickening. Small juxtapleural nodule along the left major fissure, considered benign. Moderate bilateral pleural effusions with compressive atelectasis in both lower lobes. Upper Abdomen: Liver margin is irregular. Visualized portions of the liver, adrenal glands, spleen and stomach are otherwise grossly unremarkable. Musculoskeletal: Degenerative changes in the spine. Flowing anterior osteophytosis in the thoracic spine. Kyphosis. IMPRESSION: 1. Congestive heart failure. 2. Cirrhosis. 3. Aortic atherosclerosis (ICD10-I70.0). Coronary artery calcification. 4. Enlarged pulmonic trunk, indicative of pulmonary arterial hypertension. Electronically Signed   By: Leanna Battles M.D.   On: 01/06/2024 12:17   DG Chest Port 1 View Result Date: 01/06/2024 CLINICAL DATA:  89 year old female with shortness of breath. EXAM: PORTABLE CHEST 1 VIEW COMPARISON:  Portable chest 12/13/2023 and earlier. FINDINGS: Portable AP upright view at 0943 hours. Cardiomegaly and mediastinal contours not significantly changed. Lower lung volumes. Patchy left greater than right lung base opacity has increased. No pneumothorax. Pulmonary vascularity only mildly increased. No overt edema. No definite consolidation. Paucity of bowel gas.  No acute osseous abnormality identified. IMPRESSION: Lower lung volumes with left greater than right increased lung base opacity. Cardiomegaly. This could be related to small effusions, atelectasis, infection. No overt pulmonary edema. Electronically Signed   By: Odessa Fleming M.D.   On: 01/06/2024 09:50     Assessment and Plan:   NSTEMI Troponin elevation from 89->433, patient's only symptom at this time is a globus sensation that is improving. Echocardiogram showed LV global hypokinesis with akinesis of the inferior wall with new HFrEF, LVEF of  30-35%. Although she had inferior wall hypokinesis on prior echocardiogram, echo this admission reveals worsening LV function and new LV global hypokinesis.   Will start heparin. With h/o + FOBT in 11/2023, will monitor hemoglobin. Patient is not a candidate for LHC given her frailty, age, and + FOBT. Shared decision making with patient and family, will continue conservative care with medical therapy.  May be able to add anti-anginal medications , patient's blood pressure does not currently have room for these medications. May be able to add these once the dieretics are d/c.  Continue ASA, Lipitor, Losartan. Hold off on beta blocker due to patient's mild sinus bradycardia (HR ~50s). Continue to hold off on DAPT due to anemia, will continue aspirin.   Palliative care consult  HFrEF LVEF 30-35% Prior echocardiogram in 11/2023 showed an EF of 60-65%, patient's new HFrEF most likely due to ischemia from the NSTEMI.  Initial BNP at 2,396. Physical exam did not reveal crackles consistent with the moderate bilateral pleural effusions on chest CT imaging nor did the patient have lower extremity edema, overall she appears euvolemic today.  She did receive 20 mg of IV Lasix 2x with an urinary output of  1.1 L. Would hold off on diuresis with increasing Scr.  AKI superimposed on CKD stage 3a  Likely due to poor oral intake vs diuresis Daily BMPs  Normocytic Anemia   +FOBT in February 2025 without overt GI bleeding. Iron and ferritin studies are unremarkable. Hgb remains stable at 11.4, anemia is likely due to CKD.   Dysphagia, unintentional weight loss: per primary team    Risk Assessment/Risk Scores:     TIMI Risk Score for Unstable Angina or Non-ST Elevation MI:   The patient's TIMI risk score is 4, which indicates a 20% risk of all cause mortality, new or recurrent myocardial infarction or need for urgent revascularization in the next 14 days.  For questions or updates, please contact [facility] Please consult [email or web address] for contact info under   Signed, Faith Rogue, DO  01/07/2024 12:56 PM   Patient seen, examined. Available data reviewed. Agree with findings, assessment, and plan as outlined by Dr Hessie Diener.  The patient is independently interviewed and examined.  Her family is at the bedside.  She is alert, oriented, elderly woman in no distress.  She is very hard of hearing.  HEENT is normal, JVP is normal, lungs are clear bilaterally, heart is regular rate and rhythm with a 2/6 crescendo decrescendo murmur heard best at the apex.  Abdomen is soft and nontender with no masses, extremities have no edema, skin is warm and dry with no rash.  EKG demonstrates sinus rhythm with old anteroseptal infarct and nonspecific ST/T changes.  BNP is significantly elevated at 2396 suggesting acute heart failure.  High-sensitivity troponin is mildly elevated from 378 up to 433.  The patient had an echocardiogram 1 month ago with normal LVEF and hypokinesis of the basal inferior wall.  A repeat echocardiogram this admission shows marked reduction in LVEF now 30 to 35% with global hypokinesis and akinesis of the inferior wall.  We had a lengthy discussion today about her treatment options for non-STEMI complicated by acute heart failure with reduced ejection fraction.  As outlined above, in the context of the patient's advanced age, frailty, and comorbid conditions, we decided on conservative therapy after shared decision making conversation.  At 89 years old, the patient does not wish to undergo invasive procedures and I think this is completely appropriate.  Will discontinue amlodipine in the context of her severe LV dysfunction.  Will treat her with IV heparin for 24 hours and if no recurrent chest pain we will probably stop this tomorrow.  Will keep an eye on her hemoglobin since she has guaiac positive stool.  The patient has only very mild anemia and no obvious GI bleeding.  It is reasonable to treat  her with low-dose aspirin and a high intensity statin drug.  Will hold off on beta-blockade as she is bradycardic.  There is a palliative care consultation pending.  I would avoid further diuresis at this point.  We will follow-up tomorrow morning to help optimize her medical program is much as possible.  Will need to be cautious with this with her relatively low blood pressure and very advanced age. Avoid DAPT with FOB positive stool - no plavix as long term bleeding risk too high.   Tonny Bollman, M.D. 01/07/2024 1:30 PM

## 2024-01-07 NOTE — Progress Notes (Signed)
  Echocardiogram 2D Echocardiogram has been performed.  Ocie Doyne RDCS 01/07/2024, 10:37 AM

## 2024-01-07 NOTE — Consult Note (Addendum)
 Hospital Consult    Reason for Consult:  Right ICA stenosis  Requesting Physician:  Dr. Willeen Niece  MRN #:  [id number]  History of Present Illness: This is a 89 y.o. female with past medical history including T2DM, HTN, CKD III, HLD, and CHF who presented yesterday with SOB and feeling like something was stuck in her throat. CT neck negative for any acute inflammatory process however did show significant high grade right ICA stenosis with a string sign. Carotid duplex showing 80-99% stenosis of right ICA. She has no history of TIA or stroke. She denies any amaurosis fugax, slurred speech, facial drooping, unilateral upper or lower extremity weakness or numbness. She lives independently. She does have family that lives next door that checks on her daily. She has never been a smoker. Vascular surgery was consulted for evaluation of her high grade carotid stenosis.   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 Authorizing Provider  amLODipine (NORVASC) 2.5 MG tablet TAKE (1) TABLET BY MOUTH ONCE DAILY. Patient taking differently: Take 2.5 mg by mouth. 07/27/23  Yes Shelva Majestic, MD  aspirin EC 81 MG tablet Take 1 tablet (81 mg total) by mouth daily. Swallow whole. 12/16/23  Yes Vassie Loll, MD  atorvastatin (LIPITOR) 40 MG tablet Take 1 tablet (40 mg total) by mouth daily. 12/16/23  Yes  Vassie Loll, MD  fluticasone [facility]) 50 MCG/ACT nasal spray Place 1 spray into both nostrils daily. Patient taking differently: Place 1 spray into both nostrils as needed for allergies. 02/16/23  Yes Raspet, Erin K, PA-C  losartan (COZAAR) 25 MG tablet Take 1 tablet (25 mg total) by mouth daily. 12/16/23  Yes Vassie Loll, MD  pantoprazole (PROTONIX) 40 MG tablet Take 1 tablet (40 mg total) by mouth daily. 12/16/23  Yes Vassie Loll, MD    Social History   Socioeconomic History   Marital status: Widowed    Spouse name: Not on file   Number of children: Not on file   Years of education: Not on file   Highest education level: Not on file  Occupational History   Not on file  Tobacco Use   Smoking status: Never   Smokeless tobacco: Never  Vaping Use   Vaping status: Never Used  Substance and Sexual Activity   Alcohol use: No   Drug use: No   Sexual activity: Not Currently  Other Topics Concern   Not on file  Social History Narrative   Widowed 2004. 1 son. 2 adopted grandchildren, 2 greatgrandkids. Assists in the care of autistic grandchild    [place]. Son lives close as well as grandson (next door). Completely independent. Still mows her yard.       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       Hobbies: reading, work outside      [employer] of [employer]: Low Risk  (04/16/2023)   Overall Financial Resource Strain (CARDIA)    Difficulty of Paying Living Expenses: Not hard at all  Food Insecurity: No Food Insecurity (01/06/2024)   Hunger Vital Sign    Worried About Running Out of Food in the Last Year: Never true    Ran Out of Food in the Last Year: Never true  Transportation Needs: No Transportation Needs (01/06/2024)   PRAPARE - Administrator, Civil Service (Medical): No    Lack of Transportation (Non-Medical): No  Physical Activity: Inactive (04/16/2023)   Exercise Vital Sign    Days of Exercise per Week: 0 days     Minutes of Exercise per Session: 0 min  Stress: No Stress Concern Present (04/16/2023)   [employer] of Occupational Health - Occupational Stress Questionnaire    Feeling of Stress : Not at all  Social Connections: Moderately Integrated (01/06/2024)   Social Connection and Isolation Panel [NHANES]    Frequency of Communication with Friends and Family: More than three times a week    Frequency of Social Gatherings with Friends and Family: More than three times a week    Attends Religious Services: More than 4 times per year    Active Member of [employer] or Organizations: Yes    Attends Banker Meetings: More than 4 times per year    Marital Status: Widowed  Intimate Partner Violence: Not At Risk (01/06/2024)   Humiliation, Afraid, Rape, and Kick questionnaire    Fear of Current or Ex-Partner: No    Emotionally Abused: No    Physically Abused: No    Sexually Abused: No     Family History  Problem Relation Age of Onset   Hypertension Father    Alzheimer's disease Mother     ROS: Otherwise negative unless mentioned in HPI  Physical Examination  Vitals:   01/07/24 0835 01/07/24 1137  BP: 122/61 (!) 117/45  Pulse: 82 64  Resp: 19 20  Temp: 99.8 F (37.7 C) 98.8 F (37.1 C)  SpO2: 96% 95%   Body mass index is 20.03 kg/m.  General:  WDWN in NAD; pleasant elderly female, hard of hearing Gait: Not observed HENT: WNL, normocephalic Pulmonary: normal non-labored breathing, without wheezing Cardiac: regular Abdomen:  soft Vascular Exam/Pulses: Moving her extremities without deficits, warm and well perfused Musculoskeletal: no muscle wasting or atrophy  Neurologic: A&O X 3;  No focal weakness or paresthesias are detected; speech is fluent/normal Psychiatric:  The pt has Normal affect.  CBC    Component Value Date/Time   WBC 9.7 01/07/2024 0528   RBC 3.93 01/07/2024 0528   HGB 11.4 (L) 01/07/2024 0528   HCT 35.6 (L) 01/07/2024 0528   PLT 194 01/07/2024 0528    MCV 90.6 01/07/2024 0528   MCH 29.0 01/07/2024 0528   MCHC 32.0 01/07/2024 0528   RDW 12.6 01/07/2024 0528   LYMPHSABS 3.1 12/03/2023 1143   MONOABS 0.7 12/03/2023 1143   EOSABS 0.1 12/03/2023 1143   BASOSABS 0.0 12/03/2023 1143    BMET    Component Value Date/Time   NA 142 01/07/2024 0528   K 4.7 01/07/2024 0528   CL 109 01/07/2024 0528   CO2 27 01/07/2024 0528   GLUCOSE 136 (H) 01/07/2024 0528   BUN 25 (H) 01/07/2024 0528   CREATININE 1.35 (H) 01/07/2024  2130   CREATININE 1.15 (H) 05/19/2022 1532   CALCIUM 8.7 (L) 01/07/2024 0528   GFRNONAA 36 (L) 01/07/2024 0528   GFRNONAA 40 (L) 04/22/2021 1554   GFRAA 46 (L) 04/22/2021 1554    COAGS: No results found for: "INR", "PROTIME"   Non-Invasive Vascular Imaging:   Right Carotid Findings:  +----------+--------+--------+--------+------------------+--------+           PSV cm/sEDV cm/sStenosisPlaque DescriptionComments  +----------+--------+--------+--------+------------------+--------+  CCA Prox  70      3                                           +----------+--------+--------+--------+------------------+--------+  CCA Distal65      6                                           +----------+--------+--------+--------+------------------+--------+  ICA Prox  554     132     80-99%  hypoechoic        tortuous  +----------+--------+--------+--------+------------------+--------+  ICA Mid   359     67                                tortuous  +----------+--------+--------+--------+------------------+--------+  ICA Distal80      20                                          +----------+--------+--------+--------+------------------+--------+  ECA      134     0                                           +----------+--------+--------+--------+------------------+--------+   +----------+--------+-------+----------------+-------------------+           PSV cm/sEDV cmsDescribe        Arm  Pressure (mmHG)  +----------+--------+-------+----------------+-------------------+  Subclavian226           Multiphasic, WNL                     +----------+--------+-------+----------------+-------------------+   +---------+--------+--+--------+--+---------+  VertebralPSV cm/s78EDV cm/s20Antegrade  +---------+--------+--+--------+--+---------+   Summary:  Right Carotid: Velocities in the right ICA are consistent with a 80-99% stenosis, with evidence of string sign.   Vertebrals:  Right vertebral artery demonstrates antegrade flow.  Subclavians: Normal flow hemodynamics were seen in the right subclavian artery.   CT Soft Tissue Neck w Contrast: IMPRESSION: 1. Negative for acute or inflammatory process identified in the Neck. 2. Positive for atherosclerotic RADIOGRAPHIC STRING SIGN STENOSIS of the proximal Right ICA, which remains patent.   Statin:  Yes.   Beta Blocker:  No. Aspirin:  Yes.   ACEI:  No. ARB:  Yes.   CCB use:  Yes Other antiplatelets/anticoagulants:  No.   ASSESSMENT/PLAN: This is a 89 y.o. female with asymptomatic high grade right ICA stenosis. On CT neck she was incidentally found to have severe right ICA stenosis with a string sign. Duplex confirmed high grade 80-99% right ICA stenosis. She has no history of TIA or stroke. She has no current neurological symptoms associated  with her ICA stenosis. She was recently admitted with NSTEMI and felt not to be candidate for any intervention due to her age and other comorbidities. I discussed with patient and her family at bedside that we would normally perform a carotid intervention with greater than 80% stenosis in someone with asymptomatic disease to reduce their stroke risk. Although she is high functioning and lives independently we discussed CEA vs carotid stenting vs medical management. With her age and being asymptomatic we would recommend continued medical management with Aspirin and statin. She would  like to avoid any surgical intervention and I think this is very reasonable. The on call vascular surgeon, Dr. Karin Lieu will see her later this afternoon to provide further management plans. I will arrange outpatient follow up for her though in 1 month and this will allow her time to talk with her family and decide how she would like to proceed.   Graceann Congress PA-C [facility] [phone] 01/07/2024  1:41 PM  VASCULAR STAFF ADDENDUM: I have independently interviewed and examined the patient. I agree with the above.  I had a nice conversation with Malaak and her son.  In essence, she is a 89 year old female with a symptomatic right sided critical ICA stenosis.  We discussed that with medical management, her stroke risk is 11% over the next 5 years, putting her at 89 years old.  With surgery, stroke risk is reduced to 5%.  Emmalea was very clear that she was not interested in any surgery moving forward, and was willing to take the risk of stroke, which I think is very reasonable given her age and comorbidities.  She does not need to follow-up with me at this time.  Victorino Sparrow MD [facility] Phone Number: [phone] 01/07/2024 5:44 PM

## 2024-01-07 NOTE — Plan of Care (Signed)
  Problem: Nutritional: Goal: Maintenance of adequate nutrition will improve Outcome: Progressing   Problem: Nutritional: Goal: Progress toward achieving an optimal weight will improve Outcome: Progressing   Problem: Skin Integrity: Goal: Risk for impaired skin integrity will decrease Outcome: Progressing   Problem: Activity: Goal: Risk for activity intolerance will decrease Outcome: Progressing

## 2024-01-07 NOTE — Progress Notes (Signed)
 PHARMACY - ANTICOAGULATION CONSULT NOTE  Pharmacy Consult for heparin Indication: chest pain/ACS  Allergies  Allergen Reactions   Lisinopril Cough   Statins Other (See Comments)    Hair loss   Sulfa Antibiotics Other (See Comments)    No reaction-patient states her family member was "burned" from this medication    Patient Measurements: Height: 5\' 3"  (160 cm) Weight: 51.3 kg (113 lb 1.5 oz) IBW/kg (Calculated) : 52.4 Heparin Dosing Weight: 51kg  Vital Signs: Temp: 98.8 F (37.1 C) (03/24 1137) Temp Source: Oral (03/24 1137) BP: 117/45 (03/24 1137) Pulse Rate: 64 (03/24 1137)  Labs: Recent Labs    01/06/24 0901 01/06/24 1228 01/06/24 1452 01/06/24 1716 01/07/24 0528  HGB 11.4*  --   --   --  11.4*  HCT 38.4  --   --   --  35.6*  PLT 183  --   --   --  194  CREATININE 1.03*  --   --   --  1.35*  TROPONINIHS 89* 317* 378* 433*  --     Estimated Creatinine Clearance: 20.6 mL/min (A) (by C-G formula based on SCr of 1.35 mg/dL (H)).   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 Pharmacy to dose IV heparin. No AC PTA. CBC ok but pt has hx +FOBT in February. Per cardiology, likely to heparinize for short period of time.  Goal of Therapy:  Heparin level 0.3-0.7 units/ml Monitor platelets by anticoagulation protocol: Yes   Plan:  -Heparin 550 units/h - defer bolus given advanced age, small BW, and recent +FOBT -Check heparin level in 8h  Fredonia Highland, PharmD, [place], [facility] Clinical Pharmacist [phone] Please check AMION for all [facility] Pharmacy numbers 01/07/2024

## 2024-01-07 NOTE — Progress Notes (Signed)
 PROGRESS NOTE    Linda Golden  WUJ:[id number] DOB: Nov 01, 1926 DOA: 01/06/2024 PCP: Shelva Majestic, MD  Brief Narrative:  This 89 yrs old female with medical history significant for T2DM, HTN, CKD stage 3, HLD who presented to ED with complaints of waking up feeling like she couldn't breath and having something stuck in her throat. She also had some chest pain and was sweating. She describes it as chest tightness that is off and on.  Family states she has been having these episodes since her hospital discharge for a NSTEMI. Workup in the ED CT soft tissue neck negative for acute inflammatory process.  CT chest shows CHF, cirrhosis, enlarged pulmonary trunk pulmonary arterial hypertension.  Patient was admitted for further evaluation,  Patient was started on Lasix,  Decadron and MiraLAX.  Cardiology is consulted.  Assessment & Plan:   Principal Problem:   NSTEMI (non-ST elevated myocardial infarction) (HCC) Active Problems:   Acute on chronic diastolic CHF (congestive heart failure) (HCC)   Globus sensation   Normocytic anemia   Memory loss   Controlled type 2 diabetes mellitus with renal manifestation (HCC)   Essential hypertension   CKD (chronic kidney disease), stage III (HCC)   Hyperlipidemia associated with type 2 diabetes mellitus (HCC)   Carotid stenosis   NSTEMI: Patient presented with c/o: chest pain, shortness of breath, globus sensation and ongoing intermittent episodes since her hospital d/c on 12/15/23 for an NSTEMI. She is found to have elevated troponin and findings concerning for CHF exacerbation.  Cardiology consulted. Heparin on hold with hx of +fecal occult She was admitted on 12/13/23 for NSTEMI. She was deemed not a suitable candidate for cardiac catheterization at that time. She was advised conservative management. Repeat echo with findings of CHF ASA, beta blockers avoided due to bradycardia, continue statin  Stool for occult blood positive. Had discussion  regarding GOC. If she decides not to undergo further work up for +fecal occult or cardiology decides not a candidate for LHC would encourage palliative care. She is not keen on aggressive intervention. SW consult for possible SNF placement     Acute on chronic diastolic CHF: She presented with chest pain and SOB CT chest with findings of CHF, CXR with cardiomegaly  BNP 2396.4 Continue IV Lasix 20 mg daily Strict I/O Daily weights Repeat echo in setting of possible NSTEMI    Globus sensation: Unsure if related to chest pain symptoms vs. GERD vs. Dysphagia Continue PPI SLP for swallow eval  Aspiration precautions    Normocytic anemia: Hgb appears to be stable.  +fecal occult during recent hospitalization. Denies any further bleeding Check iron studies   Memory loss: No diagnosis of dementia, but does seem to have memory loss. She cannot recall what happened at previous hospital stay Family states has been having  memory issues at home . Delirium precautions  SW consult for continued discussion regarding SNF placement    Controlled type 2 diabetes mellitus with renal manifestation (HCC) Well controlled, A1C of 6.3 in 11/2023 SSI and accuchecks QAC/HS    Essential hypertension Continue cozaar 25mg  daily Hold norvasc  No Beta blocker due to bradycardia.   CKD (chronic kidney disease), stage III (HCC) Stable. Baseline creatinine 1.0-1.1 continue to monitor    Hyperlipidemia associated with type 2 diabetes mellitus (HCC) Continue lipitor 40mg     Carotid stenosis Check right carotid US On ASA/statin    DVT prophylaxis: SCDs Code Status: DNR Family Communication: No family at bedside. Disposition Plan:  Status is: Inpatient Remains inpatient appropriate because: Severity of illness.   Consultants:  Cardiology  Procedures:  Antimicrobials: Anti-infectives (From admission, onward)    None       Subjective: Patient was seen and examined at bedside.Overnight  events noted.   Patient was having echocardiogram, She denies any chest pain, reports she is overall doing better.   She does not have any edema or leg swelling.  Objective: Vitals:   01/07/24 0413 01/07/24 0523 01/07/24 0835 01/07/24 1137  BP: (!) 112/58  122/61 (!) 117/45  Pulse: 65  82 64  Resp: 20  19 20   Temp: 99.1 F (37.3 C)  99.8 F (37.7 C) 98.8 F (37.1 C)  TempSrc: Oral  Oral Oral  SpO2: 91%  96% 95%  Weight:  51.3 kg    Height:        Intake/Output Summary (Last 24 hours) at 01/07/2024 1231 Last data filed at 01/07/2024 0930 Gross per 24 hour  Intake 800 ml  Output 1100 ml  Net -300 ml   Filed Weights   01/06/24 0859 01/07/24 0523  Weight: 55.8 kg 51.3 kg    Examination:  General exam: Appears calm and comfortable, deconditioned, not in any acute distress. Respiratory system: Clear to auscultation. Respiratory effort normal.  RR 15 Cardiovascular system: S1 & S2 heard, RRR. No JVD, murmurs, rubs, gallops or clicks.  Gastrointestinal system: Abdomen is non distended, soft and nontender.  Normal bowel sounds heard. Central nervous system: Alert and oriented x 3. No focal neurological deficits. Extremities: No edema, no cyanosis, no clubbing Skin: No rashes, lesions or ulcers Psychiatry: Judgement and insight appear normal. Mood & affect appropriate.     Data Reviewed: I have personally reviewed following labs and imaging studies  CBC: Recent Labs  Lab 01/06/24 0901 01/07/24 0528  WBC 11.7* 9.7  HGB 11.4* 11.4*  HCT 38.4 35.6*  MCV 96.5 90.6  PLT 183 194   Basic Metabolic Panel: Recent Labs  Lab 01/06/24 0901 01/06/24 1452 01/07/24 0528  NA 143  --  142  K 3.8  --  4.7  CL 109  --  109  CO2 22  --  27  GLUCOSE 182*  --  136*  BUN 22  --  25*  CREATININE 1.03*  --  1.35*  CALCIUM 8.7*  --  8.7*  MG  --  2.1  --    GFR: Estimated Creatinine Clearance: 20.6 mL/min (A) (by C-G formula based on SCr of 1.35 mg/dL (H)). Liver Function  Tests: No results for input(s): "AST", "ALT", "ALKPHOS", "BILITOT", "PROT", "ALBUMIN" in the last 168 hours. No results for input(s): "LIPASE", "AMYLASE" in the last 168 hours. No results for input(s): "AMMONIA" in the last 168 hours. Coagulation Profile: No results for input(s): "INR", "PROTIME" in the last 168 hours. Cardiac Enzymes: No results for input(s): "CKTOTAL", "CKMB", "CKMBINDEX", "TROPONINI" in the last 168 hours. BNP (last 3 results) No results for input(s): "PROBNP" in the last 8760 hours. HbA1C: No results for input(s): "HGBA1C" in the last 72 hours. CBG: Recent Labs  Lab 01/06/24 1656 01/06/24 2131 01/07/24 0834 01/07/24 1134  GLUCAP 133* 212* 109* 107*   Lipid Profile: No results for input(s): "CHOL", "HDL", "LDLCALC", "TRIG", "CHOLHDL", "LDLDIRECT" in the last 72 hours. Thyroid Function Tests: No results for input(s): "TSH", "T4TOTAL", "FREET4", "T3FREE", "THYROIDAB" in the last 72 hours. Anemia Panel: Recent Labs    01/06/24 1452  FERRITIN 129  TIBC 280  IRON 36   Sepsis Labs:  No results for input(s): "PROCALCITON", "LATICACIDVEN" in the last 168 hours.  No results found for this or any previous visit (from the past 240 hours).   Radiology Studies: ECHOCARDIOGRAM COMPLETE Result Date: 01/07/2024    ECHOCARDIOGRAM REPORT   Patient Name:   LULU HIRSCHMANN Date of Exam: 01/07/2024 Medical Rec #:  [id number]     Height:       63.0 in Accession #:    [id number]    Weight:       113.1 lb Date of Birth:  [birth year]      BSA:          1.518 m Patient Age:    89 years      BP:           122/61 mmHg Patient Gender: F             HR:           80 bpm. Exam Location:  Inpatient Procedure: 2D Echo, 3D Echo, Cardiac Doppler, Color Doppler and Strain Analysis            (Both Spectral and Color Flow Doppler were utilized during            procedure). Indications:    NSTEMI  History:        Patient has prior history of Echocardiogram examinations, most                 recent  12/13/2023. Acute MI; Risk Factors:Dyslipidemia, Diabetes                 and Hypertension. Carotid stenosis, CKD stage 3.  Sonographer:    Vern Claude Referring Phys: [id number] ALLISON WOLFE IMPRESSIONS  1. Left ventricular ejection fraction, by estimation, is 30 to 35%. The left ventricle has moderately decreased function. The left ventricle demonstrates global hypokinesis with akinesis of the inferior wall. There is mild concentric left ventricular hypertrophy. Left ventricular diastolic parameters are consistent with Grade III diastolic dysfunction (restrictive). The average left ventricular global longitudinal strain is -15.3 %.  2. Right ventricular systolic function is moderately reduced. The right ventricular size is normal. There is mildly elevated pulmonary artery systolic pressure.  3. Left atrial size was mildly dilated.  4. The mitral valve is normal in structure. No evidence of mitral valve regurgitation. No evidence of mitral stenosis.  5. By Doppler AS appears mild. But visually AS appears quite severe. Suspect moderate to severe low-gradient, low-flow AS. DI 0.50. The aortic valve is tricuspid. There is moderate calcification of the aortic valve. Aortic valve regurgitation is not visualized. Moderate to severe aortic valve stenosis. Aortic valve area, by VTI measures 1.59 cm. Aortic valve mean gradient measures 7.0 mmHg. Aortic valve Vmax measures 1.81 m/s.  6. The inferior vena cava is normal in size with greater than 50% respiratory variability, suggesting right atrial pressure of 3 mmHg. FINDINGS  Left Ventricle: Left ventricular ejection fraction, by estimation, is 30 to 35%. The left ventricle has moderately decreased function. The left ventricle demonstrates global hypokinesis. The average left ventricular global longitudinal strain is -15.3 %. Strain was performed and the global longitudinal strain is indeterminate. The left ventricular internal cavity size was normal in size. There is mild  concentric left ventricular hypertrophy. Left ventricular diastolic parameters are consistent with Grade III diastolic dysfunction (restrictive). Right Ventricle: The right ventricular size is normal. No increase in right ventricular wall thickness. Right ventricular systolic function is moderately reduced. There is mildly elevated pulmonary  artery systolic pressure. The tricuspid regurgitant velocity is 3.06 m/s, and with an assumed right atrial pressure of 3 mmHg, the estimated right ventricular systolic pressure is 40.5 mmHg. Left Atrium: Left atrial size was mildly dilated. Right Atrium: Right atrial size was normal in size. Pericardium: There is no evidence of pericardial effusion. Mitral Valve: The mitral valve is normal in structure. There is mild calcification of the mitral valve leaflet(s). No evidence of mitral valve regurgitation. No evidence of mitral valve stenosis. MV peak gradient, 5.8 mmHg. The mean mitral valve gradient  is 2.0 mmHg. Tricuspid Valve: The tricuspid valve is normal in structure. Tricuspid valve regurgitation is mild . No evidence of tricuspid stenosis. Aortic Valve: By Doppler AS appears mild. But visually AS appears quite severe. Suspect moderate to severe low-gradient, low-flow AS. DI 0.50. The aortic valve is tricuspid. There is moderate calcification of the aortic valve. Aortic valve regurgitation is not visualized. Moderate to severe aortic stenosis is present. Aortic valve mean gradient measures 7.0 mmHg. Aortic valve peak gradient measures 13.1 mmHg. Aortic valve area, by VTI measures 1.59 cm. Pulmonic Valve: The pulmonic valve was normal in structure. Pulmonic valve regurgitation is trivial. No evidence of pulmonic stenosis. Aorta: The aortic root is normal in size and structure. Venous: The inferior vena cava is normal in size with greater than 50% respiratory variability, suggesting right atrial pressure of 3 mmHg. IAS/Shunts: No atrial level shunt detected by color flow  Doppler. Additional Comments: 3D was performed not requiring image post processing on an independent workstation and was indeterminate.  LEFT VENTRICLE PLAX 2D LVIDd:         3.60 cm      Diastology LVIDs:         2.60 cm      LV e' medial:    4.03 cm/s LV PW:         0.80 cm      LV E/e' medial:  32.5 LV IVS:        1.10 cm      LV e' lateral:   6.53 cm/s LVOT diam:     2.00 cm      LV E/e' lateral: 20.1 LV SV:         60 LV SV Index:   39           2D Longitudinal Strain LVOT Area:     3.14 cm     2D Strain GLS Avg:     -15.3 %  LV Volumes (MOD) LV vol d, MOD A2C: 88.1 ml  3D Volume EF: LV vol d, MOD A4C: 116.0 ml 3D EF:        50 % LV vol s, MOD A2C: 42.7 ml  LV EDV:       163 ml LV vol s, MOD A4C: 54.9 ml  LV ESV:       82 ml LV SV MOD A2C:     45.4 ml  LV SV:        81 ml LV SV MOD A4C:     116.0 ml LV SV MOD BP:      52.8 ml RIGHT VENTRICLE            IVC RV Basal diam:  3.20 cm    IVC diam: 1.60 cm RV Mid diam:    2.60 cm RV S prime:     9.79 cm/s TAPSE (M-mode): 1.5 cm LEFT ATRIUM  Index        RIGHT ATRIUM           Index LA diam:        4.50 cm 2.97 cm/m   RA Area:     11.40 cm LA Vol (A2C):   40.5 ml 26.69 ml/m  RA Volume:   23.70 ml  15.62 ml/m LA Vol (A4C):   51.0 ml 33.61 ml/m LA Biplane Vol: 45.5 ml 29.98 ml/m  AORTIC VALVE                     PULMONIC VALVE AV Area (Vmax):    1.56 cm      PV Vmax:       0.65 m/s AV Area (Vmean):   1.48 cm      PV Peak grad:  1.7 mmHg AV Area (VTI):     1.59 cm AV Vmax:           181.00 cm/s AV Vmean:          121.000 cm/s AV VTI:            0.376 m AV Peak Grad:      13.1 mmHg AV Mean Grad:      7.0 mmHg LVOT Vmax:         89.80 cm/s LVOT Vmean:        56.900 cm/s LVOT VTI:          0.190 m LVOT/AV VTI ratio: 0.51  AORTA Ao Root diam: 2.80 cm Ao Asc diam:  2.80 cm MITRAL VALVE                TRICUSPID VALVE MV Area (PHT): 4.99 cm     TR Peak grad:   37.5 mmHg MV Area VTI:   2.15 cm     TR Vmax:        306.00 cm/s MV Peak grad:  5.8 mmHg MV Mean  grad:  2.0 mmHg     SHUNTS MV Vmax:       1.20 m/s     Systemic VTI:  0.19 m MV Vmean:      65.6 cm/s    Systemic Diam: 2.00 cm MV Decel Time: 152 msec MR Peak grad: 135.0 mmHg MR Mean grad: 80.0 mmHg MR Vmax:      581.00 cm/s MR Vmean:     413.0 cm/s MV E velocity: 131.00 cm/s MV A velocity: 64.90 cm/s MV E/A ratio:  2.02 Arvilla Meres MD Electronically signed by Arvilla Meres MD Signature Date/Time: 01/07/2024/10:46:15 AM    Final    CT Soft Tissue Neck W Contrast Result Date: 01/06/2024 CLINICAL DATA:  89 year old female with shortness of breath, complaining of mucus in her throat. EXAM: CT NECK WITH CONTRAST TECHNIQUE: Multidetector CT imaging of the neck was performed using the standard protocol following the bolus administration of intravenous contrast. RADIATION DOSE REDUCTION: This exam was performed according to the departmental dose-optimization program which includes automated exposure control, adjustment of the mA and/or kV according to patient size and/or use of iterative reconstruction technique. CONTRAST:  75mL OMNIPAQUE IOHEXOL 350 MG/ML SOLN COMPARISON:  Chest CT today reported separately. FINDINGS: Pharynx and larynx: Larynx and pharynx soft tissue contours are within normal limits. Normal epiglottis (sagittal image 42). Mild postinflammatory calcification of the left palatine tonsil. Parapharyngeal and retropharyngeal spaces appear normal. Salivary glands: Negative.  Negative sublingual space. Thyroid: Negative for age. Lymph nodes: Negative.  No cervical lymphadenopathy. Vascular: Major vascular structures in  the neck and at the skull base are patent. However, there is severe atherosclerosis and radiographic string sign stenosis at the proximal right ICA (series 3, image 46 and sagittal image 26). This is over a segment of about 6 mm. But that vessel does remain patent. Limited intracranial: Extensive Calcified atherosclerosis at the skull base. Negative visible brain parenchyma.  Visualized orbits: Postoperative changes to both globes. Mastoids and visualized paranasal sinuses: Visualized paranasal sinuses and mastoids are well aerated. Skeleton: No acute osseous abnormality identified. Age-appropriate cervical spine degeneration. Upper chest: Dedicated chest CT is reported separately. IMPRESSION: 1. Negative for acute or inflammatory process identified in the Neck. 2. Positive for atherosclerotic RADIOGRAPHIC STRING SIGN STENOSIS of the proximal Right ICA, which remains patent. Electronically Signed   By: Odessa Fleming M.D.   On: 01/06/2024 12:23   CT CHEST WO CONTRAST Result Date: 01/06/2024 CLINICAL DATA:  Lung nodule. EXAM: CT CHEST WITHOUT CONTRAST TECHNIQUE: Multidetector CT imaging of the chest was performed following the standard protocol without IV contrast. RADIATION DOSE REDUCTION: This exam was performed according to the departmental dose-optimization program which includes automated exposure control, adjustment of the mA and/or kV according to patient size and/or use of iterative reconstruction technique. COMPARISON:  Chest radiograph 01/06/2024. FINDINGS: Cardiovascular: Atherosclerotic calcification of the aorta, aortic valve and coronary arteries. Enlarged pulmonic trunk and heart. No pericardial effusion. Mediastinum/Nodes: No pathologically enlarged mediastinal or axillary lymph nodes. Hilar regions are difficult to definitively evaluate without IV contrast. Esophagus is grossly unremarkable. Lungs/Pleura: Image quality is degraded by expiratory phase imaging. Smooth septal thickening. Small juxtapleural nodule along the left major fissure, considered benign. Moderate bilateral pleural effusions with compressive atelectasis in both lower lobes. Upper Abdomen: Liver margin is irregular. Visualized portions of the liver, adrenal glands, spleen and stomach are otherwise grossly unremarkable. Musculoskeletal: Degenerative changes in the spine. Flowing anterior osteophytosis in the  thoracic spine. Kyphosis. IMPRESSION: 1. Congestive heart failure. 2. Cirrhosis. 3. Aortic atherosclerosis (ICD10-I70.0). Coronary artery calcification. 4. Enlarged pulmonic trunk, indicative of pulmonary arterial hypertension. Electronically Signed   By: Leanna Battles M.D.   On: 01/06/2024 12:17   DG Chest Port 1 View Result Date: 01/06/2024 CLINICAL DATA:  89 year old female with shortness of breath. EXAM: PORTABLE CHEST 1 VIEW COMPARISON:  Portable chest 12/13/2023 and earlier. FINDINGS: Portable AP upright view at 0943 hours. Cardiomegaly and mediastinal contours not significantly changed. Lower lung volumes. Patchy left greater than right lung base opacity has increased. No pneumothorax. Pulmonary vascularity only mildly increased. No overt edema. No definite consolidation. Paucity of bowel gas.  No acute osseous abnormality identified. IMPRESSION: Lower lung volumes with left greater than right increased lung base opacity. Cardiomegaly. This could be related to small effusions, atelectasis, infection. No overt pulmonary edema. Electronically Signed   By: Odessa Fleming M.D.   On: 01/06/2024 09:50   Scheduled Meds:  aspirin EC  81 mg Oral Daily   atorvastatin  40 mg Oral Daily   furosemide  20 mg Intravenous Daily   insulin aspart  0-9 Units Subcutaneous TID WC   losartan  25 mg Oral Daily   pantoprazole (PROTONIX) IV  40 mg Intravenous Q24H   Continuous Infusions:   LOS: 1 day    Time spent: 50 mins    Willeen Niece, MD [facility]   If 7PM-7AM, please contact night-coverage

## 2024-01-07 NOTE — Progress Notes (Signed)
 SLP Cancellation Note  Patient Details Name: CALIANA SPIRES MRN: [id number] DOB: 02-14-1927   Cancelled treatment:       Reason Eval/Treat Not Completed: Patient at procedure or test/unavailable. Pt is NPO awaiting procedure. SLP will re-attempt as coordination of care permits.    Ellery Plunk 01/07/2024, 8:12 AM

## 2024-01-08 DIAGNOSIS — I214 Non-ST elevation (NSTEMI) myocardial infarction: Secondary | ICD-10-CM | POA: Diagnosis not present

## 2024-01-08 LAB — BASIC METABOLIC PANEL
Anion gap: 8 (ref 5–15)
BUN: 29 mg/dL — ABNORMAL HIGH (ref 8–23)
CO2: 28 mmol/L (ref 22–32)
Calcium: 8.7 mg/dL — ABNORMAL LOW (ref 8.9–10.3)
Chloride: 105 mmol/L (ref 98–111)
Creatinine, Ser: 1.26 mg/dL — ABNORMAL HIGH (ref 0.44–1.00)
GFR, Estimated: 40 mL/min — ABNORMAL LOW (ref >60)
Glucose, Bld: 125 mg/dL — ABNORMAL HIGH (ref 70–99)
Potassium: 3.5 mmol/L (ref 3.5–5.1)
Sodium: 141 mmol/L (ref 135–145)

## 2024-01-08 LAB — CBC
HCT: 37.9 % (ref 36.0–46.0)
Hemoglobin: 12.3 g/dL (ref 12.0–15.0)
MCH: 29.4 pg (ref 26.0–34.0)
MCHC: 32.5 g/dL (ref 30.0–36.0)
MCV: 90.7 fL (ref 80.0–100.0)
Platelets: 178 10*3/uL (ref 150–400)
RBC: 4.18 MIL/uL (ref 3.87–5.11)
RDW: 12.6 % (ref 11.5–15.5)
WBC: 8.9 10*3/uL (ref 4.0–10.5)
nRBC: 0 % (ref 0.0–0.2)

## 2024-01-08 LAB — GLUCOSE, CAPILLARY: Glucose-Capillary: 110 mg/dL — ABNORMAL HIGH (ref 70–99)

## 2024-01-08 LAB — HEPARIN LEVEL (UNFRACTIONATED): Heparin Unfractionated: 0.36 [IU]/mL (ref 0.30–0.70)

## 2024-01-08 LAB — PHOSPHORUS: Phosphorus: 3.2 mg/dL (ref 2.5–4.6)

## 2024-01-08 LAB — MAGNESIUM: Magnesium: 2.2 mg/dL (ref 1.7–2.4)

## 2024-01-08 MED ORDER — NITROGLYCERIN 0.4 MG SL SUBL: 0.4000 mg | SUBLINGUAL_TABLET | SUBLINGUAL | 3 refills | Status: AC | PRN

## 2024-01-08 MED ORDER — BISACODYL 10 MG RE SUPP
10.0000 mg | Freq: Once | RECTAL | Status: AC
  Administered 2024-01-08: 10 mg via RECTAL
  Filled 2024-01-08: qty 1

## 2024-01-08 NOTE — Evaluation (Addendum)
 Occupational Therapy Evaluation Patient Details Name: Linda Golden MRN: [id number] DOB: 08-25-1927 Today's Date: 01/08/2024   History of Present Illness   Linda Golden is a 89 y.o. female with medical history significant of T2DM, HTN, CKD stage 3, HLD who presented to [facility] 01/06/24 with complaints of waking up feeling like she couldn't breath and having something stuck in her throat. She also had some chest pain and was sweating. She describes it as chest tightness that is off and on.     Clinical Impressions Pt at this time presented with granddaughter in the room and they reported they have [place]. Pt was using no DME but family did note that they started to reach out to balance self since their last hospital stay.  Pt's family will complete shopping and take her out to md but was recently noted that she is starting to forget when the stove is on. Family agreed at this time to increase in level of supervision with the return to home. It is recommended to have a home health safety assessment due to cognitive changes with the return to home. Pt and family agreeable at this time.      If plan is discharge home, recommend the following:   Assistance with cooking/housework;Direct supervision/assist for medications management;Direct supervision/assist for financial management;Assist for transportation     Functional Status Assessment   Patient has had a recent decline in their functional status and demonstrates the ability to make significant improvements in function in a reasonable and predictable amount of time.     Equipment Recommendations   None recommended by OT     Recommendations for Other Services         Precautions/Restrictions   Precautions Precautions: Fall Recall of Precautions/Restrictions: Impaired Precaution/Restrictions Comments: no reports of any recent falls Restrictions Weight Bearing Restrictions Per Provider  Order: No     Mobility Bed Mobility Overal bed mobility: Independent                  Transfers Overall transfer level: Independent Equipment used: None                      Balance Overall balance assessment: Independent, Mild deficits observed, not formally tested (Per family reported they are starting to reach out to things to hold onto at times)                                         ADL either performed or assessed with clinical judgement   ADL Overall ADL's : At baseline;Modified independent                                             Vision Baseline Vision/History: 0 No visual deficits Ability to See in Adequate Light: 0 Adequate Patient Visual Report: No change from baseline Vision Assessment?: No apparent visual deficits     Perception Perception: Within Functional Limits       Praxis Praxis: WFL       Pertinent Vitals/Pain Pain Assessment Pain Assessment: No/denies pain     Extremity/Trunk Assessment Upper Extremity Assessment Upper Extremity Assessment: Overall WFL for tasks assessed (Pt is able to reach to head for care and backside  for peri care but noted ridgity)   Lower Extremity Assessment Lower Extremity Assessment: Defer to PT evaluation   Cervical / Trunk Assessment Cervical / Trunk Assessment: Kyphotic   Communication Communication Communication: Impaired Factors Affecting Communication: Hearing impaired   Cognition Arousal: Alert Behavior During Therapy: WFL for tasks assessed/performed Cognition: Cognition impaired           Executive functioning impairment (select all impairments): Problem solving OT - Cognition Comments: Per granddaughter pt starting to leave stove on and forgetting and noted in session needs increase in time to problem solve and sequence                 Following commands: Intact       Cueing  General Comments   Cueing Techniques: Verbal  cues      Exercises     Shoulder Instructions      Home Living Family/patient expects to be discharged to:: Private residence Living Arrangements: Alone Available Help at Discharge: Family;Available PRN/intermittently Type of Home: House Home Access: Level entry     Home Layout: One level     Bathroom Shower/Tub: Chief Strategy Officer: Standard Bathroom Accessibility: Yes   Home Equipment: Grab bars - tub/shower;Shower seat - built Char[employer] fundraiser (2 wheels)          Prior Functioning/Environment Prior Level of Function : Independent/Modified Independent             Mobility Comments: Denies recent falls, ambulates without AD ADLs Comments: family does shopping, she does cook and use stove.    OT Problem List: Decreased activity tolerance;Decreased safety awareness;Decreased knowledge of use of DME or AE   OT Treatment/Interventions: Self-care/ADL training;Patient/family education;Cognitive remediation/compensation      OT Goals(Current goals can be found in the care plan section)   Acute Rehab OT Goals Patient Stated Goal: to go home OT Goal Formulation: With patient Time For Goal Achievement: 01/22/24 Potential to Achieve Goals: Good   OT Frequency:  Min 1X/week    Co-evaluation PT/OT/SLP Co-Evaluation/Treatment: Yes Reason for Co-Treatment: For patient/therapist safety;To address functional/ADL transfers PT goals addressed during session: Mobility/safety with mobility;Balance OT goals addressed during session: ADL's and self-care      AM-PAC OT "6 Clicks" Daily Activity     Outcome Measure Help from another person eating meals?: None Help from another person taking care of personal grooming?: None Help from another person toileting, which includes using toliet, bedpan, or urinal?: None Help from another person bathing (including washing, rinsing, drying)?: None Help from another person to put on and taking off regular upper body  clothing?: None Help from another person to put on and taking off regular lower body clothing?: None 6 Click Score: 24   End of Session Nurse Communication: Mobility status  Activity Tolerance: Patient tolerated treatment well Patient left: in chair;with call bell/phone within reach;with family/visitor present  OT Visit Diagnosis: Unsteadiness on feet (R26.81);Other abnormalities of gait and mobility (R26.89);Muscle weakness (generalized) (M62.81);History of falling (Z91.81)                Time: 0102-7253 OT Time Calculation (min): 24 min Charges:  OT General Charges $OT Visit: 1 Visit OT Evaluation $OT Eval Low Complexity: 1 Low  Presley Raddle OTR/L  Acute Rehab Services  [phone] office number   Alphia Moh 01/08/2024, 12:03 PM

## 2024-01-08 NOTE — Evaluation (Signed)
 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 She also had some chest pain and was sweating. She describes it as chest tightness that is off and on. Pt with medical history significant for T2DM, HTN, CKD stage 3, HLD    Assessment / Plan / Recommendation  Clinical Impression  Pt presents with clinical indicators of a primary esophageal dysphagia.  She complains of globus sensation in absence of POs.  Pt tolerated all consistencies trialed without any clinical s/s of aspiration including straw sips of thin liquid and achieved good oral clearance of solids independently.  CXR 3/23: "Lower lung volumes with left greater than right increased lung base opacity."  Chest CT 3/23: "Moderate bilateral pleural effusions with compressive atelectasis in both lower lobes." Should symptoms persist and are not otherwise explainable by findings on imaging, consider esophagram for further assessment of esophageal phase of swallowing. Pt has no further ST needs.  SLP will sign off.   Recommend continuing regular texture diet with thin liquids.  SLP Visit Diagnosis: Dysphagia, pharyngoesophageal phase (R13.14)    Aspiration Risk  No limitations     Diet Recommendation Regular;Thin liquid    Medication Administration: Whole meds with liquid Supervision: Patient able to self feed Compensations: Slow rate;Small sips/bites Postural Changes: Seated upright at 90 degrees;Remain upright for at least 30 minutes after po intake    Other  Recommendations Recommended Consults: Consider esophageal assessment Oral Care Recommendations: Oral care BID    Recommendations for follow up therapy are one component of a multi-disciplinary discharge planning process, led by the attending physician.  Recommendations may be updated based on patient status, additional functional criteria and insurance authorization.  Follow up Recommendations No SLP follow up      Assistance Recommended at Discharge  N/A  Functional Status Assessment Patient has not had a recent decline in their functional status  Frequency and Duration  (N/A)          Prognosis Prognosis for improved oropharyngeal function:  (N/A)      Swallow Study   General Date of Onset: 01/08/24 HPI: Linda Golden is a 89 yr old female who presented to ED with complaints of waking up feeling like she couldn't breath and having something stuck in her throat. She also had some chest pain and was sweating. She describes it as chest tightness that is off and on. Pt with medical history significant for T2DM, HTN, CKD stage 3, HLD Type of Study: Bedside Swallow Evaluation Previous Swallow Assessment: None Diet Prior to this Study: NPO Temperature Spikes Noted: No Respiratory Status: Room air History of Recent Intubation: No Behavior/Cognition: Alert;Cooperative;Pleasant  mood Oral Cavity Assessment: Within Functional Limits Oral Care Completed by SLP: No Oral Cavity - Dentition: Adequate natural dentition Vision: Functional for self-feeding Self-Feeding Abilities: Able to feed self Patient Positioning: Upright in bed Baseline Vocal Quality: Normal Volitional Cough: Strong Volitional Swallow:  Able to elicit    Oral/Motor/Sensory Function Overall Oral Motor/Sensory Function: Within functional limits   Ice Chips Ice chips: Not tested   Thin Liquid Thin Liquid: Within functional limits    Nectar Thick Nectar Thick Liquid: Not tested   Honey Thick Honey Thick Liquid: Not tested   Puree Puree: Within functional limits Presentation: Spoon   Solid     Solid: Within functional limits Presentation: Self Fed      Kerrie Pleasure, MA, CCC-SLP Acute Rehabilitation Services Office: [phone] 01/08/2024,9:08 AM

## 2024-01-08 NOTE — Progress Notes (Signed)
 PHARMACY - ANTICOAGULATION Pharmacy Consult for heparin Indication: chest pain/ACS Brief A/P: Heparin level subtherapeutic Increase Heparin rate  Allergies  Allergen Reactions   Lisinopril Cough   Statins Other (See Comments)    Hair loss   Sulfa Antibiotics Other (See Comments)    No reaction-patient states her family member was "burned" from this medication    Patient Measurements: Height: 5\' 3"  (160 cm) Weight: 51.3 kg (113 lb 1.5 oz) IBW/kg (Calculated) : 52.4 Heparin Dosing Weight: 51kg  Vital Signs: Temp: 98.8 F (37.1 C) (03/24 2002) Temp Source: Oral (03/24 2002) BP: 97/80 (03/24 2002) Pulse Rate: 64 (03/24 2002)  Labs: Recent Labs    01/06/24 0901 01/06/24 1228 01/06/24 1452 01/06/24 1716 01/07/24 0528 01/07/24 2321  HGB 11.4*  --   --   --  11.4*  --   HCT 38.4  --   --   --  35.6*  --   PLT 183  --   --   --  194  --   HEPARINUNFRC  --   --   --   --   --  <0.10*  CREATININE 1.03*  --   --   --  1.35*  --   TROPONINIHS 89* 317* 378* 433*  --   --     Estimated Creatinine Clearance: 20.6 mL/min (A) (by C-G formula based on SCr of 1.35 mg/dL (H)).  Assessment: 89 y.o. female with NSTEMI for heparin  Goal of Therapy:  Heparin level 0.3-0.7 units/ml Monitor platelets by anticoagulation protocol: Yes   Plan:  Increase Heparin 750 units/hr Check heparin level in 8 hours.  Geannie Risen, PharmD, BCPS

## 2024-01-08 NOTE — Progress Notes (Signed)
 PHARMACY - ANTICOAGULATION Pharmacy Consult for heparin Indication: chest pain/ACS Brief A/P: Heparin level subtherapeutic Increase Heparin rate  Allergies  Allergen Reactions   Lisinopril Cough   Statins Other (See Comments)    Hair loss   Sulfa Antibiotics Other (See Comments)    No reaction-patient states her family member was "burned" from this medication    Patient Measurements: Height: 5\' 3"  (160 cm) Weight: 49.9 kg (110 lb) IBW/kg (Calculated) : 52.4 Heparin Dosing Weight: 51kg  Vital Signs: Temp: 98.1 F (36.7 C) (03/25 0723) Temp Source: Oral (03/25 0723) BP: 131/65 (03/25 0723) Pulse Rate: 77 (03/25 0723)  Labs: Recent Labs    01/06/24 0901 01/06/24 1228 01/06/24 1452 01/06/24 1716 01/07/24 0528 01/07/24 2321 01/08/24 0825  HGB 11.4*  --   --   --  11.4*  --  12.3  HCT 38.4  --   --   --  35.6*  --  37.9  PLT 183  --   --   --  194  --  178  HEPARINUNFRC  --   --   --   --   --  <0.10* 0.36  CREATININE 1.03*  --   --   --  1.35*  --  1.26*  TROPONINIHS 89* 317* 378* 433*  --   --   --     Estimated Creatinine Clearance: 21.5 mL/min (A) (by C-G formula based on SCr of 1.26 mg/dL (H)).  Assessment: 89 y.o. female with NSTEMI for heparin. Heparin level therapeutic this am, pt to D/C today.  Goal of Therapy:  Heparin level 0.3-0.7 units/ml Monitor platelets by anticoagulation protocol: Yes   Plan:  Pharmacy will sign off  Fredonia Highland, PharmD, BCPS, [facility] Clinical Pharmacist [phone] Please check AMION for all [facility] Pharmacy numbers 01/08/2024

## 2024-01-08 NOTE — Discharge Summary (Signed)
 Physician Discharge Summary  Linda Golden ZOX:[id number] DOB: Mar 04, 1927 DOA: 01/06/2024  PCP: Shelva Majestic, MD  Admit date: 01/06/2024  Discharge date: 01/08/2024  Admitted From: Home Health  Disposition:  Home Health  Recommendations for Outpatient Follow-up:  Follow up with PCP in 1-2 weeks. Please obtain BMP/CBC in one week.. Advised to follow-up with cardiology as scheduled.. Advised to hold beta-blockers due to bradycardia. Follow-up vascular surgery as scheduled.  Home Health:Home PT/OT Equipment/Devices: None  Discharge Condition: Stable CODE STATUS:DNR Diet recommendation: Heart Healthy   Brief Summary/ Hospital Course: This 89 yrs old female with medical history significant for T2DM, HTN, CKD stage 3, HLD who presented to ED with complaints of waking up feeling like she couldn't breath and having something stuck in her throat. She also had some chest pain and was sweating. She describes it as chest tightness that is off and on.  Family states she has been having these episodes since her hospital discharge for a NSTEMI. Workup in the ED CT soft tissue neck negative for acute inflammatory process.  CT chest shows CHF, cirrhosis, enlarged pulmonary trunk pulmonary arterial hypertension.  Patient was admitted for further evaluation,  Patient was started on Lasix,  Decadron and MiraLAX.  Cardiology was consulted , stool for occult blood+, cardiology recommended conservative management.  Due to her advanced age and frailty she is not a candidate for aggressive GDMT.  Metoprolol held because of bradycardia.  Avoid clopidogrel due to the risk of bleeding. Carotid duplex showed 80 to 99% right ICA stenosis.  Vascular surgery consulted.  She is high functioning and lives independently.  Vascular surgery discussed CEA vs carotid stenting versus medical management.  Given her age and being asymptomatic, vascular surgery recommended medical management.  Patient would also like to avoid any  surgical intervention and which is reasonable.  Patient feels better and wants to be discharged.  Cardiology signed off.  Discharge Diagnoses:  Principal Problem:   NSTEMI (non-ST elevated myocardial infarction) (HCC) Active Problems:   Acute on chronic diastolic CHF (congestive heart failure) (HCC)   Globus sensation   Normocytic anemia   Memory loss   Controlled type 2 diabetes mellitus with renal manifestation (HCC)   Essential hypertension   CKD (chronic kidney disease), stage III (HCC)   Hyperlipidemia associated with type 2 diabetes mellitus (HCC)   Carotid stenosis  NSTEMI: Patient presented with c/o: chest pain, shortness of breath, globus sensation and ongoing intermittent episodes since her hospital d/c on 12/15/23 for an NSTEMI. She is found to have elevated troponin and findings concerning for CHF exacerbation.  Cardiology consulted. Heparin on hold with hx of +fecal occult She was admitted on 12/13/23 for NSTEMI. She was deemed not a suitable candidate for cardiac catheterization at that time. She was advised conservative management. Repeat echo with findings of CHF ASA, beta blockers avoided due to bradycardia, continue statin  Stool for occult blood positive. Had discussion regarding GOC. If she decides not to undergo further work up for +fecal occult or cardiology decides not a candidate for LHC would encourage palliative care. She is not keen on aggressive intervention. SW consult for possible SNF placement. Continue conservative management aspirin ,sublingual nitro, Lipitor and losartan.    Acute on chronic diastolic CHF: She presented with chest pain and SOB CT chest with findings of CHF, CXR with cardiomegaly  BNP 2396.4 Continue IV Lasix 20 mg daily Strict I/O Daily weights  Globus sensation: Unsure if related to chest pain symptoms vs. GERD vs.  Dysphagia Continue PPI SLP for swallow eval  Aspiration precautions .   Normocytic anemia: Hgb appears to be  stable.  +fecal occult during recent hospitalization. Denies any further bleeding    Memory loss: No diagnosis of dementia, but does seem to have memory loss. She cannot recall what happened at previous hospital stay Family states has been having  memory issues at home . Delirium precautions  SW consult for continued discussion regarding SNF placement    Controlled type 2 diabetes mellitus with renal manifestation (HCC) Well controlled, A1C of 6.3 in 11/2023 SSI and accuchecks QAC/HS    Essential hypertension Continue cozaar 25mg  daily Hold norvasc  No Beta blocker due to bradycardia.   CKD (chronic kidney disease), stage III (HCC) Stable. Baseline creatinine 1.0-1.1 continue to monitor    Hyperlipidemia associated with type 2 diabetes mellitus (HCC) Continue lipitor 40mg     Carotid stenosis Check right carotid US On ASA/statin. Medical management.  Discharge Instructions  Discharge Instructions     Call MD for:  difficulty breathing, headache or visual disturbances   Complete by: As directed    Call MD for:  persistant dizziness or light-headedness   Complete by: As directed    Call MD for:  persistant nausea and vomiting   Complete by: As directed    Diet - low sodium heart healthy   Complete by: As directed    Discharge instructions   Complete by: As directed    Advised to follow-up with primary care physician in 1 week. Advised to follow-up with cardiology as scheduled. Advised to hold beta-blockers due to bradycardia.   Increase activity slowly   Complete by: As directed    No wound care   Complete by: As directed       Allergies as of 01/08/2024       Reactions   Lisinopril Cough   Statins Other (See Comments)   Hair loss   Sulfa Antibiotics Other (See Comments)   No reaction-patient states her family member was "burned" from this medication        Medication List     STOP taking these medications    amLODipine 2.5 MG tablet Commonly  known as: NORVASC       TAKE these medications    aspirin EC 81 MG tablet Take 1 tablet (81 mg total) by mouth daily. Swallow whole.   atorvastatin 40 MG tablet Commonly known as: LIPITOR Take 1 tablet (40 mg total) by mouth daily.   fluticasone 50 MCG/ACT nasal spray Commonly known as: FLONASE Place 1 spray into both nostrils daily. What changed:  when to take this reasons to take this   losartan 25 MG tablet Commonly known as: COZAAR Take 1 tablet (25 mg total) by mouth daily.   nitroGLYCERIN 0.4 MG SL tablet Commonly known as: Nitrostat Place 1 tablet (0.4 mg total) under the tongue every 5 (five) minutes as needed for chest pain.   pantoprazole 40 MG tablet Commonly known as: PROTONIX Take 1 tablet (40 mg total) by mouth daily.        Follow-up Information     Victorino Sparrow, MD Follow up in 1 month(s).   Specialty: Vascular Surgery Why: The office will call you with your appointment Contact information: [address] [place] [address] [phone]         Shelva Majestic, MD Follow up in 1 week(s).   Specialty: Family Medicine Contact information: [address] [place] [address] [phone]  [facility] Follow up.   Why: Physical and Occupational Therapy-office to call with visit times. Contact information: [address] [place] [address] [phone]                Allergies  Allergen Reactions   Lisinopril Cough   Statins Other (See Comments)    Hair loss   Sulfa Antibiotics Other (See Comments)    No reaction-patient states her family member was "burned" from this medication    Consultations: Cardiology Vascular surgery  Procedures/Studies: VAS US CAROTID Result Date: 01/07/2024 Carotid Arterial Duplex Study Patient Name:  ANNASTON UPHAM  Date of Exam:   01/07/2024 Medical Rec #: [id number]      Accession #:    [id number] Date of Birth: July 24, 1927       Patient  Gender: F Patient Age:   4 years Exam Location:  [facility] Procedure:      VAS US CAROTID Referring Phys: Orland Mustard --------------------------------------------------------------------------------  Indications:       Stenosis of RT ICA seen on soft tissue CT exam. Risk Factors:      Hypertension, hyperlipidemia, Diabetes, no history of                    smoking, prior MI. Other Factors:     CKD, CHF. Comparison Study:  No previous exams Performing Technologist: Jody Hill RVT, RDMS  Examination Guidelines: A complete evaluation includes B-mode imaging, spectral Doppler, color Doppler, and power Doppler as needed of all accessible portions of each vessel. Bilateral testing is considered an integral part of a complete examination. Limited examinations for reoccurring indications may be performed as noted.  Right Carotid Findings: +----------+--------+--------+--------+------------------+--------+           PSV cm/sEDV cm/sStenosisPlaque DescriptionComments +----------+--------+--------+--------+------------------+--------+ CCA Prox  70      3                                          +----------+--------+--------+--------+------------------+--------+ CCA Distal65      6                                          +----------+--------+--------+--------+------------------+--------+ ICA Prox  554     132     80-99%  hypoechoic        tortuous +----------+--------+--------+--------+------------------+--------+ ICA Mid   359     67                                tortuous +----------+--------+--------+--------+------------------+--------+ ICA Distal80      20                                         +----------+--------+--------+--------+------------------+--------+ ECA       134     0                                          +----------+--------+--------+--------+------------------+--------+ +----------+--------+-------+----------------+-------------------+            PSV cm/sEDV  cmsDescribe        Arm Pressure (mmHG) +----------+--------+-------+----------------+-------------------+ Subclavian226            Multiphasic, WNL                    +----------+--------+-------+----------------+-------------------+ +---------+--------+--+--------+--+---------+ VertebralPSV cm/s78EDV cm/s20Antegrade +---------+--------+--+--------+--+---------+   Summary: Right Carotid: Velocities in the right ICA are consistent with a 80-99%                stenosis, with evidence of string sign. Vertebrals:  Right vertebral artery demonstrates antegrade flow. Subclavians: Normal flow hemodynamics were seen in the right subclavian artery. *See table(s) above for measurements and observations.  Suggest Peripheral Vascular Consult. Electronically signed by Delia Heady MD on 01/07/2024 at 1:31:11 PM.    Final    ECHOCARDIOGRAM COMPLETE Result Date: 01/07/2024    ECHOCARDIOGRAM REPORT   Patient Name:   GENORA ARP Date of Exam: 01/07/2024 Medical Rec #:  [id number]     Height:       63.0 in Accession #:    [id number]    Weight:       113.1 lb Date of Birth:  Sep 28, 1927      BSA:          1.518 m Patient Age:    89 years      BP:           122/61 mmHg Patient Gender: F             HR:           80 bpm. Exam Location:  Inpatient Procedure: 2D Echo, 3D Echo, Cardiac Doppler, Color Doppler and Strain Analysis            (Both Spectral and Color Flow Doppler were utilized during            procedure). Indications:    NSTEMI  History:        Patient has prior history of Echocardiogram examinations, most                 recent 12/13/2023. Acute MI; Risk Factors:Dyslipidemia, Diabetes                 and Hypertension. Carotid stenosis, CKD stage 3.  Sonographer:    Vern Claude Referring Phys: [id number] ALLISON WOLFE IMPRESSIONS  1. Left ventricular ejection fraction, by estimation, is 30 to 35%. The left ventricle has moderately decreased function. The left ventricle demonstrates global  hypokinesis with akinesis of the inferior wall. There is mild concentric left ventricular hypertrophy. Left ventricular diastolic parameters are consistent with Grade III diastolic dysfunction (restrictive). The average left ventricular global longitudinal strain is -15.3 %.  2. Right ventricular systolic function is moderately reduced. The right ventricular size is normal. There is mildly elevated pulmonary artery systolic pressure.  3. Left atrial size was mildly dilated.  4. The mitral valve is normal in structure. No evidence of mitral valve regurgitation. No evidence of mitral stenosis.  5. By Doppler AS appears mild. But visually AS appears quite severe. Suspect moderate to severe low-gradient, low-flow AS. DI 0.50. The aortic valve is tricuspid. There is moderate calcification of the aortic valve. Aortic valve regurgitation is not visualized. Moderate to severe aortic valve stenosis. Aortic valve area, by VTI measures 1.59 cm. Aortic valve mean gradient measures 7.0 mmHg. Aortic valve Vmax measures 1.81 m/s.  6. The inferior vena cava is normal in size with greater than 50% respiratory variability, suggesting right atrial pressure of  3 mmHg. FINDINGS  Left Ventricle: Left ventricular ejection fraction, by estimation, is 30 to 35%. The left ventricle has moderately decreased function. The left ventricle demonstrates global hypokinesis. The average left ventricular global longitudinal strain is -15.3 %. Strain was performed and the global longitudinal strain is indeterminate. The left ventricular internal cavity size was normal in size. There is mild concentric left ventricular hypertrophy. Left ventricular diastolic parameters are consistent with Grade III diastolic dysfunction (restrictive). Right Ventricle: The right ventricular size is normal. No increase in right ventricular wall thickness. Right ventricular systolic function is moderately reduced. There is mildly elevated pulmonary artery systolic  pressure. The tricuspid regurgitant velocity is 3.06 m/s, and with an assumed right atrial pressure of 3 mmHg, the estimated right ventricular systolic pressure is 40.5 mmHg. Left Atrium: Left atrial size was mildly dilated. Right Atrium: Right atrial size was normal in size. Pericardium: There is no evidence of pericardial effusion. Mitral Valve: The mitral valve is normal in structure. There is mild calcification of the mitral valve leaflet(s). No evidence of mitral valve regurgitation. No evidence of mitral valve stenosis. MV peak gradient, 5.8 mmHg. The mean mitral valve gradient  is 2.0 mmHg. Tricuspid Valve: The tricuspid valve is normal in structure. Tricuspid valve regurgitation is mild . No evidence of tricuspid stenosis. Aortic Valve: By Doppler AS appears mild. But visually AS appears quite severe. Suspect moderate to severe low-gradient, low-flow AS. DI 0.50. The aortic valve is tricuspid. There is moderate calcification of the aortic valve. Aortic valve regurgitation is not visualized. Moderate to severe aortic stenosis is present. Aortic valve mean gradient measures 7.0 mmHg. Aortic valve peak gradient measures 13.1 mmHg. Aortic valve area, by VTI measures 1.59 cm. Pulmonic Valve: The pulmonic valve was normal in structure. Pulmonic valve regurgitation is trivial. No evidence of pulmonic stenosis. Aorta: The aortic root is normal in size and structure. Venous: The inferior vena cava is normal in size with greater than 50% respiratory variability, suggesting right atrial pressure of 3 mmHg. IAS/Shunts: No atrial level shunt detected by color flow Doppler. Additional Comments: 3D was performed not requiring image post processing on an independent workstation and was indeterminate.  LEFT VENTRICLE PLAX 2D LVIDd:         3.60 cm      Diastology LVIDs:         2.60 cm      LV e' medial:    4.03 cm/s LV PW:         0.80 cm      LV E/e' medial:  32.5 LV IVS:        1.10 cm      LV e' lateral:   6.53 cm/s  LVOT diam:     2.00 cm      LV E/e' lateral: 20.1 LV SV:         60 LV SV Index:   39           2D Longitudinal Strain LVOT Area:     3.14 cm     2D Strain GLS Avg:     -15.3 %  LV Volumes (MOD) LV vol d, MOD A2C: 88.1 ml  3D Volume EF: LV vol d, MOD A4C: 116.0 ml 3D EF:        50 % LV vol s, MOD A2C: 42.7 ml  LV EDV:       163 ml LV vol s, MOD A4C: 54.9 ml  LV ESV:  82 ml LV SV MOD A2C:     45.4 ml  LV SV:        81 ml LV SV MOD A4C:     116.0 ml LV SV MOD BP:      52.8 ml RIGHT VENTRICLE            IVC RV Basal diam:  3.20 cm    IVC diam: 1.60 cm RV Mid diam:    2.60 cm RV S prime:     9.79 cm/s TAPSE (M-mode): 1.5 cm LEFT ATRIUM             Index        RIGHT ATRIUM           Index LA diam:        4.50 cm 2.97 cm/m   RA Area:     11.40 cm LA Vol (A2C):   40.5 ml 26.69 ml/m  RA Volume:   23.70 ml  15.62 ml/m LA Vol (A4C):   51.0 ml 33.61 ml/m LA Biplane Vol: 45.5 ml 29.98 ml/m  AORTIC VALVE                     PULMONIC VALVE AV Area (Vmax):    1.56 cm      PV Vmax:       0.65 m/s AV Area (Vmean):   1.48 cm      PV Peak grad:  1.7 mmHg AV Area (VTI):     1.59 cm AV Vmax:           181.00 cm/s AV Vmean:          121.000 cm/s AV VTI:            0.376 m AV Peak Grad:      13.1 mmHg AV Mean Grad:      7.0 mmHg LVOT Vmax:         89.80 cm/s LVOT Vmean:        56.900 cm/s LVOT VTI:          0.190 m LVOT/AV VTI ratio: 0.51  AORTA Ao Root diam: 2.80 cm Ao Asc diam:  2.80 cm MITRAL VALVE                TRICUSPID VALVE MV Area (PHT): 4.99 cm     TR Peak grad:   37.5 mmHg MV Area VTI:   2.15 cm     TR Vmax:        306.00 cm/s MV Peak grad:  5.8 mmHg MV Mean grad:  2.0 mmHg     SHUNTS MV Vmax:       1.20 m/s     Systemic VTI:  0.19 m MV Vmean:      65.6 cm/s    Systemic Diam: 2.00 cm MV Decel Time: 152 msec MR Peak grad: 135.0 mmHg MR Mean grad: 80.0 mmHg MR Vmax:      581.00 cm/s MR Vmean:     413.0 cm/s MV E velocity: 131.00 cm/s MV A velocity: 64.90 cm/s MV E/A ratio:  2.02 Arvilla Meres MD  Electronically signed by Arvilla Meres MD Signature Date/Time: 01/07/2024/10:46:15 AM    Final    CT Soft Tissue Neck W Contrast Result Date: 01/06/2024 CLINICAL DATA:  89 year old female with shortness of breath, complaining of mucus in her throat. EXAM: CT NECK WITH CONTRAST TECHNIQUE: Multidetector CT imaging of the neck was performed using the standard protocol following the bolus administration of  intravenous contrast. RADIATION DOSE REDUCTION: This exam was performed according to the departmental dose-optimization program which includes automated exposure control, adjustment of the mA and/or kV according to patient size and/or use of iterative reconstruction technique. CONTRAST:  75mL OMNIPAQUE IOHEXOL 350 MG/ML SOLN COMPARISON:  Chest CT today reported separately. FINDINGS: Pharynx and larynx: Larynx and pharynx soft tissue contours are within normal limits. Normal epiglottis (sagittal image 42). Mild postinflammatory calcification of the left palatine tonsil. Parapharyngeal and retropharyngeal spaces appear normal. Salivary glands: Negative.  Negative sublingual space. Thyroid: Negative for age. Lymph nodes: Negative.  No cervical lymphadenopathy. Vascular: Major vascular structures in the neck and at the skull base are patent. However, there is severe atherosclerosis and radiographic string sign stenosis at the proximal right ICA (series 3, image 46 and sagittal image 26). This is over a segment of about 6 mm. But that vessel does remain patent. Limited intracranial: Extensive Calcified atherosclerosis at the skull base. Negative visible brain parenchyma. Visualized orbits: Postoperative changes to both globes. Mastoids and visualized paranasal sinuses: Visualized paranasal sinuses and mastoids are well aerated. Skeleton: No acute osseous abnormality identified. Age-appropriate cervical spine degeneration. Upper chest: Dedicated chest CT is reported separately. IMPRESSION: 1. Negative for acute or  inflammatory process identified in the Neck. 2. Positive for atherosclerotic RADIOGRAPHIC STRING SIGN STENOSIS of the proximal Right ICA, which remains patent. Electronically Signed   By: Odessa Fleming M.D.   On: 01/06/2024 12:23   CT CHEST WO CONTRAST Result Date: 01/06/2024 CLINICAL DATA:  Lung nodule. EXAM: CT CHEST WITHOUT CONTRAST TECHNIQUE: Multidetector CT imaging of the chest was performed following the standard protocol without IV contrast. RADIATION DOSE REDUCTION: This exam was performed according to the departmental dose-optimization program which includes automated exposure control, adjustment of the mA and/or kV according to patient size and/or use of iterative reconstruction technique. COMPARISON:  Chest radiograph 01/06/2024. FINDINGS: Cardiovascular: Atherosclerotic calcification of the aorta, aortic valve and coronary arteries. Enlarged pulmonic trunk and heart. No pericardial effusion. Mediastinum/Nodes: No pathologically enlarged mediastinal or axillary lymph nodes. Hilar regions are difficult to definitively evaluate without IV contrast. Esophagus is grossly unremarkable. Lungs/Pleura: Image quality is degraded by expiratory phase imaging. Smooth septal thickening. Small juxtapleural nodule along the left major fissure, considered benign. Moderate bilateral pleural effusions with compressive atelectasis in both lower lobes. Upper Abdomen: Liver margin is irregular. Visualized portions of the liver, adrenal glands, spleen and stomach are otherwise grossly unremarkable. Musculoskeletal: Degenerative changes in the spine. Flowing anterior osteophytosis in the thoracic spine. Kyphosis. IMPRESSION: 1. Congestive heart failure. 2. Cirrhosis. 3. Aortic atherosclerosis (ICD10-I70.0). Coronary artery calcification. 4. Enlarged pulmonic trunk, indicative of pulmonary arterial hypertension. Electronically Signed   By: Leanna Battles M.D.   On: 01/06/2024 12:17   DG Chest Port 1 View Result Date:  01/06/2024 CLINICAL DATA:  89 year old female with shortness of breath. EXAM: PORTABLE CHEST 1 VIEW COMPARISON:  Portable chest 12/13/2023 and earlier. FINDINGS: Portable AP upright view at 0943 hours. Cardiomegaly and mediastinal contours not significantly changed. Lower lung volumes. Patchy left greater than right lung base opacity has increased. No pneumothorax. Pulmonary vascularity only mildly increased. No overt edema. No definite consolidation. Paucity of bowel gas.  No acute osseous abnormality identified. IMPRESSION: Lower lung volumes with left greater than right increased lung base opacity. Cardiomegaly. This could be related to small effusions, atelectasis, infection. No overt pulmonary edema. Electronically Signed   By: Odessa Fleming M.D.   On: 01/06/2024 09:50   VAS [place] ABI WITH/WO TBI  Result Date: 01/03/2024  LOWER EXTREMITY DOPPLER STUDY Patient Name:  EMMANUELLA MIRANTE  Date of Exam:   01/03/2024 Medical Rec #: [id number]      Accession #:    [id number] Date of Birth: 1927/04/15       Patient Gender: F Patient Age:   54 years Exam Location:  [facility] Procedure:      VAS [place] ABI WITH/WO TBI Referring Phys: Tana Conch --------------------------------------------------------------------------------  Indications: Claudication. High Risk Factors: Hypertension, hyperlipidemia, Diabetes, no history of                    smoking, prior MI.  Comparison Study: No prior study. Performing Technologist: Dominica Severin RVS, RCS  Examination Guidelines: A complete evaluation includes at minimum, Doppler waveform signals and systolic blood pressure reading at the level of bilateral brachial, anterior tibial, and posterior tibial arteries, when vessel segments are accessible. Bilateral testing is considered an integral part of a complete examination. Photoelectric Plethysmograph (PPG) waveforms and toe systolic pressure readings are included as required and additional duplex testing as needed. Limited examinations for  reoccurring indications may be performed as noted.  ABI Findings: +---------+------------------+-----+----------+--------+ Right    Rt Pressure (mmHg)IndexWaveform  Comment  +---------+------------------+-----+----------+--------+ Brachial 146                                       +---------+------------------+-----+----------+--------+ PTA      57                0.39 monophasic         +---------+------------------+-----+----------+--------+ PERO     55                0.38 monophasic         +---------+------------------+-----+----------+--------+ DP       58                0.40 monophasic         +---------+------------------+-----+----------+--------+ Great Toe64                0.44                    +---------+------------------+-----+----------+--------+ +---------+------------------+-----+----------+-------+ Left     Lt Pressure (mmHg)IndexWaveform  Comment +---------+------------------+-----+----------+-------+ Brachial 141                                      +---------+------------------+-----+----------+-------+ PTA      76                0.52 monophasic        +---------+------------------+-----+----------+-------+ PERO     75                0.51 monophasic        +---------+------------------+-----+----------+-------+ DP       56                0.38 monophasic        +---------+------------------+-----+----------+-------+ Great Toe81                0.55                   +---------+------------------+-----+----------+-------+ +-------+-----------+-----------+------------+------------+ ABI/TBIToday's ABIToday's TBIPrevious ABIPrevious TBI +-------+-----------+-----------+------------+------------+ Right  0.40       0.44                                +-------+-----------+-----------+------------+------------+  Left   0.52       0.55                                 +-------+-----------+-----------+------------+------------+   Summary: Right: Resting right ankle-brachial index indicates severe right lower extremity arterial disease. The right toe-brachial index is abnormal. Left: Resting left ankle-brachial index indicates moderate left lower extremity arterial disease. The left toe-brachial index is abnormal. *See table(s) above for measurements and observations.  Electronically signed by Charlton Haws MD on 01/03/2024 at 3:52:43 PM.    Final    ECHOCARDIOGRAM COMPLETE Result Date: 12/13/2023    ECHOCARDIOGRAM REPORT   Patient Name:   HAJIRA VERHAGEN Date of Exam: 12/13/2023 Medical Rec #:  [id number]     Height:       63.0 in Accession #:    [id number]    Weight:       110.8 lb Date of Birth:  [birth year]      BSA:          1.504 m Patient Age:    89 years      BP:           113/38 mmHg Patient Gender: F             HR:           56 bpm. [facility] Procedure: 2D Echo, Cardiac Doppler and Color Doppler (Both Spectral and Color            Flow Doppler were utilized during procedure). Indications:    NSTEMI I21.4  History:        Patient has no prior history of Echocardiogram examinations.                 Acute MI; Risk Factors:Diabetes and Hypertension.  Sonographer:    Webb Laws Referring Phys: [id number] CARLOS MADERA IMPRESSIONS  1. Left ventricular ejection fraction, by estimation, is 60 to 65%. The left ventricle has normal function. The left ventricle demonstrates regional wall motion abnormalities (see scoring diagram/findings for description). There is severe asymmetric left ventricular hypertrophy of the basal segment. Left ventricular diastolic parameters are consistent with Grade I diastolic dysfunction (impaired relaxation).  2. Right ventricular systolic function is normal. The right ventricular size is normal. There is normal pulmonary artery systolic pressure. The estimated right ventricular systolic pressure is 28.4 mmHg.  3. Left atrial size was  upper normal.  4. The mitral valve is degenerative. Trivial mitral valve regurgitation.  5. The aortic valve is tricuspid. There is moderate calcification of the aortic valve with restricted motion of the noncoronary cusp. Aortic valve regurgitation is not visualized.  6. The inferior vena cava is normal in size with greater than 50% respiratory variability, suggesting right atrial pressure of 3 mmHg. Comparison(s): No prior Echocardiogram. FINDINGS  Left Ventricle: Left ventricular ejection fraction, by estimation, is 60 to 65%. The left ventricle has normal function. The left ventricle demonstrates regional wall motion abnormalities. Strain imaging was not performed. The left ventricular internal cavity size was normal in size. There is severe asymmetric left ventricular hypertrophy of the basal segment. Left ventricular diastolic parameters are consistent with Grade I diastolic dysfunction (impaired relaxation).  LV Wall Scoring: The basal inferior segment and basal inferoseptal segment are hypokinetic. The entire anterior wall, entire lateral wall, entire anterior septum, entire apex, mid and distal inferior wall, and mid inferoseptal segment are normal. Right  Ventricle: The right ventricular size is normal. No increase in right ventricular wall thickness. Right ventricular systolic function is normal. There is normal pulmonary artery systolic pressure. The tricuspid regurgitant velocity is 2.52 m/s, and  with an assumed right atrial pressure of 3 mmHg, the estimated right ventricular systolic pressure is 28.4 mmHg. Left Atrium: Left atrial size was upper normal. Right Atrium: Right atrial size was normal in size. Pericardium: There is no evidence of pericardial effusion. Presence of epicardial fat layer. Mitral Valve: The mitral valve is degenerative in appearance. There is mild calcification of the mitral valve leaflet(s). Mild mitral annular calcification. Trivial mitral valve regurgitation. Tricuspid Valve:  The tricuspid valve is grossly normal. Tricuspid valve regurgitation is mild. Aortic Valve: The aortic valve is tricuspid. There is moderate calcification of the aortic valve. There is mild aortic valve annular calcification. Aortic valve regurgitation is not visualized. Pulmonic Valve: The pulmonic valve was not well visualized. Pulmonic valve regurgitation is trivial. Aorta: The aortic root and ascending aorta are structurally normal, with no evidence of dilitation. Venous: The inferior vena cava is normal in size with greater than 50% respiratory variability, suggesting right atrial pressure of 3 mmHg. IAS/Shunts: The interatrial septum was not well visualized. Additional Comments: 3D imaging was not performed.  LEFT VENTRICLE PLAX 2D LVIDd:         2.10 cm   Diastology LVIDs:         1.60 cm   LV e' medial:    4.03 cm/s LV PW:         1.50 cm   LV E/e' medial:  24.1 LV IVS:        1.90 cm   LV e' lateral:   7.29 cm/s LVOT diam:     1.80 cm   LV E/e' lateral: 13.3 LV SV:         61 LV SV Index:   40 LVOT Area:     2.54 cm  RIGHT VENTRICLE RV Basal diam:  2.90 cm RV S prime:     16.00 cm/s TAPSE (M-mode): 2.1 cm LEFT ATRIUM             Index        RIGHT ATRIUM          Index LA diam:        4.30 cm 2.86 cm/m   RA Area:     9.91 cm LA Vol (A2C):   49.3 ml 32.77 ml/m  RA Volume:   15.40 ml 10.24 ml/m LA Vol (A4C):   42.3 ml 28.12 ml/m LA Biplane Vol: 45.8 ml 30.44 ml/m  AORTIC VALVE LVOT Vmax:   95.10 cm/s LVOT Vmean:  70.400 cm/s LVOT VTI:    0.239 m  AORTA Ao Root diam: 2.60 cm Ao Asc diam:  3.30 cm MITRAL VALVE                TRICUSPID VALVE MV Area (PHT): 2.54 cm     TR Peak grad:   25.4 mmHg MV Decel Time: 299 msec     TR Vmax:        252.00 cm/s MV E velocity: 97.30 cm/s MV A velocity: 134.00 cm/s  SHUNTS MV E/A ratio:  0.73         Systemic VTI:  0.24 m                             Systemic Diam: 1.80 cm  Nona Dell MD Electronically signed by Nona Dell MD Signature Date/Time:  12/13/2023/2:37:43 PM    Final    DG Chest Port 1 View Result Date: 12/13/2023 CLINICAL DATA:  Fever and chills. EXAM: PORTABLE CHEST 1 VIEW COMPARISON:  Chest radiograph dated 12/03/2023. FINDINGS: Stable mild cardiomegaly. Aortic atherosclerosis. Emphysematous changes in the upper lungs. No focal consolidation, sizeable pleural effusion, or pneumothorax. Osteopenia. No acute osseous abnormality. IMPRESSION: No acute cardiopulmonary findings.  Emphysema. Electronically Signed   By: Hart Robinsons M.D.   On: 12/13/2023 13:43      Subjective: Patient was seen and examined at bedside.Overnight events noted.   Patient reports doing much better and wants to be discharged.  She denies any dizziness.  Discharge Exam: Vitals:   01/08/24 0027 01/08/24 0723  BP: (!) 127/48 131/65  Pulse: 90 77  Resp: 16 18  Temp: 98.8 F (37.1 C) 98.1 F (36.7 C)  SpO2: 93%    Vitals:   01/07/24 2002 01/08/24 0027 01/08/24 0500 01/08/24 0723  BP: 97/80 (!) 127/48  131/65  Pulse: 64 90  77  Resp: 19 16  18   Temp: 98.8 F (37.1 C) 98.8 F (37.1 C)  98.1 F (36.7 C)  TempSrc: Oral Oral  Oral  SpO2: 93% 93%    Weight:   49.9 kg   Height:        General: Pt is alert, awake, not in acute distress Cardiovascular: RRR, S1/S2 +, no rubs, no gallops Respiratory: CTA bilaterally, no wheezing, no rhonchi Abdominal: Soft, NT, ND, bowel sounds + Extremities: no edema, no cyanosis    The results of significant diagnostics from this hospitalization (including imaging, microbiology, ancillary and laboratory) are listed below for reference.     Microbiology: No results found for this or any previous visit (from the past 240 hours).   Labs: BNP (last 3 results) Recent Labs    01/06/24 1452  BNP 2,396.4*   Basic Metabolic Panel: Recent Labs  Lab 01/06/24 0901 01/06/24 1452 01/07/24 0528 01/08/24 0825  NA 143  --  142 141  K 3.8  --  4.7 3.5  CL 109  --  109 105  CO2 22  --  27 28  GLUCOSE  182*  --  136* 125*  BUN 22  --  25* 29*  CREATININE 1.03*  --  1.35* 1.26*  CALCIUM 8.7*  --  8.7* 8.7*  MG  --  2.1  --  2.2  PHOS  --   --   --  3.2   Liver Function Tests: No results for input(s): "AST", "ALT", "ALKPHOS", "BILITOT", "PROT", "ALBUMIN" in the last 168 hours. No results for input(s): "LIPASE", "AMYLASE" in the last 168 hours. No results for input(s): "AMMONIA" in the last 168 hours. CBC: Recent Labs  Lab 01/06/24 0901 01/07/24 0528 01/08/24 0825  WBC 11.7* 9.7 8.9  HGB 11.4* 11.4* 12.3  HCT 38.4 35.6* 37.9  MCV 96.5 90.6 90.7  PLT 183 194 178   Cardiac Enzymes: No results for input(s): "CKTOTAL", "CKMB", "CKMBINDEX", "TROPONINI" in the last 168 hours. BNP: Invalid input(s): "POCBNP" CBG: Recent Labs  Lab 01/07/24 0834 01/07/24 1134 01/07/24 1614 01/07/24 2200 01/08/24 0816  GLUCAP 109* 107* 172* 111* 110*   D-Dimer No results for input(s): "DDIMER" in the last 72 hours. Hgb A1c No results for input(s): "HGBA1C" in the last 72 hours. Lipid Profile No results for input(s): "CHOL", "HDL", "LDLCALC", "TRIG", "CHOLHDL", "LDLDIRECT" in the last 72 hours. Thyroid function studies No  results for input(s): "TSH", "T4TOTAL", "T3FREE", "THYROIDAB" in the last 72 hours.  Invalid input(s): "FREET3" Anemia work up Recent Labs    01/06/24 1452  FERRITIN 129  TIBC 280  IRON 36   Urinalysis    Component Value Date/Time   COLORURINE YELLOW 12/13/2023 1057   APPEARANCEUR CLEAR 12/13/2023 1057   LABSPEC 1.014 12/13/2023 1057   PHURINE 5.0 12/13/2023 1057   GLUCOSEU NEGATIVE 12/13/2023 1057   GLUCOSEU NEGATIVE 12/03/2023 1143   HGBUR NEGATIVE 12/13/2023 1057   HGBUR negative 06/29/2010 1015   BILIRUBINUR NEGATIVE 12/13/2023 1057   BILIRUBINUR n 05/24/2015 1121   KETONESUR NEGATIVE 12/13/2023 1057   PROTEINUR NEGATIVE 12/13/2023 1057   UROBILINOGEN 0.2 12/03/2023 1143   NITRITE POSITIVE (A) 12/13/2023 1057   LEUKOCYTESUR TRACE (A) 12/13/2023 1057    Sepsis Labs Recent Labs  Lab 01/06/24 0901 01/07/24 0528 01/08/24 0825  WBC 11.7* 9.7 8.9   Microbiology No results found for this or any previous visit (from the past 240 hours).   Time coordinating discharge: Over 30 minutes  SIGNED:   Willeen Niece, MD  [facility] 01/08/2024, 4:22 PM Pager   If 7PM-7AM, please contact night-coverage

## 2024-01-08 NOTE — Plan of Care (Signed)
 Patient is discharged to home. All personal belongings returned. PIV removed. Discharge education/instruction provided to patient and granddaughter.    Problem: Education: Goal: Ability to describe self-care measures that may prevent or decrease complications (Diabetes Survival Skills Education) will improve Outcome: Progressing Goal: Individualized Educational Video(s) Outcome: Progressing   Problem: Coping: Goal: Ability to adjust to condition or change in health will improve Outcome: Progressing   Problem: Fluid Volume: Goal: Ability to maintain a balanced intake and output will improve Outcome: Progressing   Problem: Health Behavior/Discharge Planning: Goal: Ability to identify and utilize available resources and services will improve Outcome: Progressing Goal: Ability to manage health-related needs will improve Outcome: Progressing   Problem: Metabolic: Goal: Ability to maintain appropriate glucose levels will improve Outcome: Progressing   Problem: Nutritional: Goal: Maintenance of adequate nutrition will improve Outcome: Progressing Goal: Progress toward achieving an optimal weight will improve Outcome: Progressing   Problem: Skin Integrity: Goal: Risk for impaired skin integrity will decrease Outcome: Progressing   Problem: Tissue Perfusion: Goal: Adequacy of tissue perfusion will improve Outcome: Progressing   Problem: Education: Goal: Knowledge of General Education information will improve Description: Including pain rating scale, medication(s)/side effects and non-pharmacologic comfort measures Outcome: Progressing   Problem: Health Behavior/Discharge Planning: Goal: Ability to manage health-related needs will improve Outcome: Progressing   Problem: Clinical Measurements: Goal: Ability to maintain clinical measurements within normal limits will improve Outcome: Progressing Goal: Will remain free from infection Outcome: Progressing Goal: Diagnostic  test results will improve Outcome: Progressing Goal: Respiratory complications will improve Outcome: Progressing Goal: Cardiovascular complication will be avoided Outcome: Progressing   Problem: Activity: Goal: Risk for activity intolerance will decrease Outcome: Progressing   Problem: Nutrition: Goal: Adequate nutrition will be maintained Outcome: Progressing   Problem: Coping: Goal: Level of anxiety will decrease Outcome: Progressing   Problem: Elimination: Goal: Will not experience complications related to bowel motility Outcome: Progressing Goal: Will not experience complications related to urinary retention Outcome: Progressing   Problem: Pain Managment: Goal: General experience of comfort will improve and/or be controlled Outcome: Progressing   Problem: Safety: Goal: Ability to remain free from injury will improve Outcome: Progressing   Problem: Skin Integrity: Goal: Risk for impaired skin integrity will decrease Outcome: Progressing

## 2024-01-08 NOTE — Progress Notes (Signed)
 Heart Failure Navigator Progress Note  Assessed for Heart & Vascular TOC clinic readiness.  Patient does not meet criteria due to per Cardiology going home with Palliative care and conservative treatment. .   Navigator will sign off at this time.   Rhae Hammock, BSN, Scientist, clinical (histocompatibility and immunogenetics) Only

## 2024-01-08 NOTE — Progress Notes (Addendum)
   Patient Name: Linda Golden Date of Encounter: 01/08/2024 [facility] Cardiologist: None   Interval Summary  .    She is doing ok today. Denies chest pain or shortness of breath. Continues to have globus sensation. She would like to go home today.   Vital Signs .    Vitals:   01/07/24 2002 01/08/24 0027 01/08/24 0500 01/08/24 0723  BP: 97/80 (!) 127/48  131/65  Pulse: 64 90  77  Resp: 19 16  18   Temp: 98.8 F (37.1 C) 98.8 F (37.1 C)  98.1 F (36.7 C)  TempSrc: Oral Oral  Oral  SpO2: 93% 93%    Weight:   49.9 kg   Height:        Intake/Output Summary (Last 24 hours) at 01/08/2024 0921 Last data filed at 01/08/2024 0139 Gross per 24 hour  Intake 583.09 ml  Output 900 ml  Net -316.91 ml      01/08/2024    5:00 AM 01/07/2024    5:23 AM 01/06/2024    8:59 AM  Last 3 Weights  Weight (lbs) 110 lb 113 lb 1.5 oz 123 lb 0.3 oz  Weight (kg) 49.896 kg 51.3 kg 55.8 kg      Telemetry/ECG    Sinus rhythm with occasional PVCs - Personally Reviewed  Physical Exam .   GEN: No acute distress.  Frail appearing, elderly  Neck: No JVD Cardiac: RRR, soft systolic murmur loudest at the apex, no rubs or gallops.  Respiratory: Clear to auscultation bilaterally. MS: No edema  Assessment & Plan .     NSTEMI Patient was started on IV heparin on 3/24, patient denies chest pain, will stop heparin drip this afternoon. Will continue aspirin, liptior, and losartan. Patient's blood pressure has improved, may start nitroglycerin prn for anti-anginals. DO not use beat blockers due to bradycardia or calcium channel blockers due to HFrEF. Do not recommended DAPT due to + FOBT.    HFrEF LVEF 30-35% Patient remains euvolemic. Will continue to hold off on lasix at this time.   CKD Stage 3a Renal function has improved to 1.26.    Normocytic Anemia  Hgb is 12.3 today, continue to monitor.   Cardiology will sign off: For questions or updates, please contact [facility] Please consult [email or web address] for contact info under   Signed, Faith Rogue, DO    Patient seen, examined. Available data reviewed. Agree with findings, assessment, and plan as outlined by Faith Rogue, DO.  Patient is sitting up in her bed eating breakfast.  She has no complaints today.  She denies chest pain, chest pressure, or shortness of breath.  On exam she is an elderly woman in no distress.  She is very frail appearing and kyphotic.  Lungs are clear, heart is regular rate and rhythm with a 2/6 systolic murmur best heard at the apex.  Due to her advanced age and frailty, she is not a candidate for aggressive GDMT.  Hold on beta-blocker due to bradycardia.  Treat with aspirin 81 mg.  Avoid clopidogrel due to bleeding risk.  Continue atorvastatin and losartan at current doses.  No evidence of volume overload.  It would be reasonable to discharge her with sublingual nitroglycerin to be used as needed.  We will sign off today.  Please call if any questions.  Tonny Bollman, M.D. 01/08/2024 11:04 AM

## 2024-01-08 NOTE — TOC Initial Note (Signed)
 Transition of Care [facility]) - Initial/Assessment Note    Patient Details  Name: Linda Golden MRN: [id number] Date of Birth: August 24, 1927  [facility] CM/SW Contact:    Gala Lewandowsky, RN Phone Number: 01/08/2024, 12:17 PM  Clinical Narrative: Patient presented for Nstemi-plan for home with home health services. Case Manager spoke with patient and patient's son regarding care-agreeable to home health services. Medicare.gov list reviewed and the son chose [facility]- agency unable to accept the patient and son is agreeable to [facility]. Referral made and the office will contact son for visit times. No DME needs at this time. Daughter-in-law to transport home via private vehicle.             Expected Discharge Plan: Home w Home Health Services Barriers to Discharge: No Barriers Identified   Patient Goals and CMS Choice Patient states their goals for this hospitalization and ongoing recovery are:: plan to return home   Choice offered to / list presented to : NA  Expected Discharge Plan and Services   Discharge Planning Services: CM Consult Post Acute Care Choice: Home Health Living arrangements for the past 2 months: Single Family Home Expected Discharge Date: 01/08/24                HH Arranged: PT, OT HH Agency: [facility] Date [facility] Agency Contacted: 01/08/24 Time HH Agency Contacted: 1217 Representative spoke with at [facility] Agency: Amy  Prior Living Arrangements/Services Living arrangements for the past 2 months: Single Family Home Lives with:: Self Patient language and need for interpreter reviewed:: Yes Do you feel safe going back to the place where you live?: Yes      Need for Family Participation in Patient Care: Yes (Comment) Care giver support system in place?: Yes (comment)   Criminal Activity/Legal Involvement Pertinent to Current Situation/Hospitalization: No - Comment as needed  Activities of Daily Living   ADL Screening  (condition at time of admission) Independently performs ADLs?: Yes (appropriate for developmental age) Is the patient deaf or have difficulty hearing?: Yes Does the patient have difficulty seeing, even when wearing glasses/contacts?: Yes Does the patient have difficulty concentrating, remembering, or making decisions?: No  Permission Sought/Granted Permission sought to share information with : Family Supports, Case Production designer, theatre/television/film, Oceanographer granted to share information with : Yes, Verbal Permission Granted     Permission granted to share info w AGENCY: [facility]  Emotional Assessment Appearance:: Appears stated age Attitude/Demeanor/Rapport: Engaged Affect (typically observed): Appropriate Orientation: : Oriented to Self, Oriented to Place, Oriented to  Time, Oriented to Situation Alcohol / Substance Use: Not Applicable Psych Involvement: No (comment)  Admission diagnosis:  NSTEMI (non-ST elevated myocardial infarction) (HCC) [I21.4] Congestive heart failure, unspecified HF chronicity, unspecified heart failure type (HCC) [I50.9] Patient Active Problem List   Diagnosis Date Noted   Acute on chronic diastolic CHF (congestive heart failure) (HCC) 01/06/2024   Normocytic anemia 01/06/2024   Memory loss 01/06/2024   Globus sensation 01/06/2024   Carotid stenosis 01/06/2024   NSTEMI (non-ST elevated myocardial infarction) (HCC) 12/13/2023   Diverticulitis of intestine with abscess 07/03/2015   CKD (chronic kidney disease), stage III (HCC) 10/23/2014   Raynaud's disease 10/08/2013   Osteoarthritis 07/01/2008   Varicose veins 11/04/2007   Controlled type 2 diabetes mellitus with renal manifestation (HCC) 04/18/2007   Hyperlipidemia associated with type 2 diabetes mellitus (HCC) 04/18/2007   Essential hypertension 04/18/2007   Osteopenia 04/18/2007   PCP:  Shelva Majestic, MD Pharmacy:  [address] - [place], [place] - [address] [place] [address] Phone: [phone] Fax: [phone]  [facility] - [place], [place] - [facility] AT Portal to Registered [address] [place] [address] Phone: [phone] Fax: [phone]  Social Drivers of Health (SDOH) Social History: SDOH Screenings   Food Insecurity: No Food Insecurity (01/06/2024)  Housing: Low Risk  (01/06/2024)  Transportation Needs: No Transportation Needs (01/06/2024)  Utilities: Not At Risk (01/06/2024)  Depression (PHQ2-9): Low Risk  (04/16/2023)  Financial Resource Strain: Low Risk  (04/16/2023)  Physical Activity: Inactive (04/16/2023)  Social Connections: Moderately Integrated (01/06/2024)  Stress: No Stress Concern Present (04/16/2023)  Tobacco Use: Low Risk  (01/06/2024)   Readmission Risk Interventions     No data to display

## 2024-01-08 NOTE — Evaluation (Signed)
 Physical Therapy Evaluation Patient Details Name: Linda Golden MRN: [id number] DOB: 1927-03-21 Today's Date: 01/08/2024  History of Present Illness  Linda Golden is a 89 y.o. female with medical history significant of T2DM, HTN, CKD stage 3, HLD who presented to [facility] 01/06/24 with complaints of waking up feeling like she couldn't breath and having something stuck in her throat. She also had some chest pain and was sweating. She describes it as chest tightness that is off and on.   Clinical Impression  Patient received up in recliner with family at bedside. Patient is very HOH, but is agreeable to PT/OT assessment. She is independent with sit to stand. Ambulated 125 feet without AD, supervision. No lob. Patient appears to be close to baseline. She would benefit from PT/OT home assessment for safety as she lives alone. No further skilled acute needs. Signing off.          If plan is discharge home, recommend the following: A little help with walking and/or transfers;A little help with bathing/dressing/bathroom;Assist for transportation;Assistance with cooking/housework;Help with stairs or ramp for entrance   Can travel by private vehicle    yes    Equipment Recommendations None recommended by PT  Recommendations for Other Services       Functional Status Assessment Patient has had a recent decline in their functional status and demonstrates the ability to make significant improvements in function in a reasonable and predictable amount of time.     Precautions / Restrictions Precautions Precautions: Fall Recall of Precautions/Restrictions: Impaired Restrictions Weight Bearing Restrictions Per Provider Order: No      Mobility  Bed Mobility Overal bed mobility: Independent                  Transfers Overall transfer level: Independent Equipment used: None                    Ambulation/Gait Ambulation/Gait assistance: Supervision Gait Distance (Feet): 125  Feet Assistive device: None Gait Pattern/deviations: Step-through pattern, Decreased step length - right, Decreased step length - left, Decreased stride length, Narrow base of support Gait velocity: decr     General Gait Details: no LOB, no AD  Stairs            Wheelchair Mobility     Tilt Bed    Modified Rankin (Stroke Patients Only)       Balance Overall balance assessment: Independent, Mild deficits observed, not formally tested                                           Pertinent Vitals/Pain Pain Assessment Pain Assessment: No/denies pain    Home Living Family/patient expects to be discharged to:: Private residence Living Arrangements: Alone Available Help at Discharge: Family;Available PRN/intermittently Type of Home: House         Home Layout: One level        Prior Function Prior Level of Function : Independent/Modified Independent             Mobility Comments: Denies recent falls, ambulates without AD ADLs Comments: family does shopping, she does cook and use stove.     Extremity/Trunk Assessment   Upper Extremity Assessment Upper Extremity Assessment: Defer to OT evaluation    Lower Extremity Assessment Lower Extremity Assessment: Overall WFL for tasks assessed    Cervical / Trunk Assessment Cervical / Trunk  Assessment: Kyphotic  Communication   Communication Communication: Impaired Factors Affecting Communication: Hearing impaired    Cognition Arousal: Alert Behavior During Therapy: WFL for tasks assessed/performed   PT - Cognitive impairments: Memory                         Following commands: Intact       Cueing Cueing Techniques: Verbal cues     General Comments      Exercises     Assessment/Plan    PT Assessment All further PT needs can be met in the next venue of care  PT Problem List Decreased balance;Decreased cognition       PT Treatment Interventions      PT Goals  (Current goals can be found in the Care Plan section)  Acute Rehab PT Goals Patient Stated Goal: return home PT Goal Formulation: With patient/family Time For Goal Achievement: 01/12/24 Potential to Achieve Goals: Good    Frequency       Co-evaluation PT/OT/SLP Co-Evaluation/Treatment: Yes Reason for Co-Treatment: For patient/therapist safety;To address functional/ADL transfers PT goals addressed during session: Mobility/safety with mobility;Balance         AM-PAC PT "6 Clicks" Mobility  Outcome Measure Help needed turning from your back to your side while in a flat bed without using bedrails?: None Help needed moving from lying on your back to sitting on the side of a flat bed without using bedrails?: None Help needed moving to and from a bed to a chair (including a wheelchair)?: A Little Help needed standing up from a chair using your arms (e.g., wheelchair or bedside chair)?: None Help needed to walk in hospital room?: A Little Help needed climbing 3-5 steps with a railing? : A Little 6 Click Score: 21    End of Session   Activity Tolerance: Patient tolerated treatment well Patient left: in chair;with family/visitor present Nurse Communication: Mobility status PT Visit Diagnosis: Unsteadiness on feet (R26.81)    Time: 9629-5284 PT Time Calculation (min) (ACUTE ONLY): 13 min   Charges:   PT Evaluation $PT Eval Low Complexity: 1 Low   PT General Charges $$ ACUTE PT VISIT: 1 Visit         Lively Haberman, PT, GCS 01/08/24,11:55 AM

## 2024-01-08 NOTE — Progress Notes (Signed)
 Discharge instructions (including medications) discussed with patient and daughter and copy provided to patient/caregiver  Patient is getting her heparin stopped by her RN and she is giving suppository right before patient leaves.

## 2024-01-08 NOTE — Discharge Instructions (Signed)
 Advised to follow-up with primary care physician in 1 week. Advised to follow-up with cardiology as scheduled. Advised to hold beta-blockers due to bradycardia.

## 2024-01-09 ENCOUNTER — Encounter: Payer: Self-pay | Admitting: Family Medicine

## 2024-01-09 ENCOUNTER — Telehealth: Payer: Self-pay | Admitting: *Deleted

## 2024-01-09 ENCOUNTER — Ambulatory Visit: Payer: PPO | Admitting: Family Medicine

## 2024-01-09 VITALS — BP 128/60 | HR 88 | Temp 97.2°F | Ht 63.0 in | Wt 111.6 lb

## 2024-01-09 DIAGNOSIS — N183 Chronic kidney disease, stage 3 unspecified: Secondary | ICD-10-CM

## 2024-01-09 DIAGNOSIS — E785 Hyperlipidemia, unspecified: Secondary | ICD-10-CM

## 2024-01-09 DIAGNOSIS — E1122 Type 2 diabetes mellitus with diabetic chronic kidney disease: Secondary | ICD-10-CM

## 2024-01-09 DIAGNOSIS — I10 Essential (primary) hypertension: Secondary | ICD-10-CM

## 2024-01-09 DIAGNOSIS — E1169 Type 2 diabetes mellitus with other specified complication: Secondary | ICD-10-CM

## 2024-01-09 DIAGNOSIS — I5033 Acute on chronic diastolic (congestive) heart failure: Secondary | ICD-10-CM

## 2024-01-09 DIAGNOSIS — I2510 Atherosclerotic heart disease of native coronary artery without angina pectoris: Secondary | ICD-10-CM | POA: Insufficient documentation

## 2024-01-09 DIAGNOSIS — I25118 Atherosclerotic heart disease of native coronary artery with other forms of angina pectoris: Secondary | ICD-10-CM | POA: Diagnosis not present

## 2024-01-09 NOTE — Progress Notes (Signed)
 Phone [phone] In person visit   Subjective:   Linda Golden is a 89 y.o. year old very pleasant female patient who presents for/with See problem oriented charting Chief Complaint  Patient presents with   1 month f/u   Past Medical History-  Patient Active Problem List   Diagnosis Date Noted   CAD (coronary artery disease) 01/09/2024    Priority: High   Acute on chronic diastolic CHF (congestive heart failure) (HCC) 01/06/2024    Priority: High   Controlled type 2 diabetes mellitus with renal manifestation (HCC) 04/18/2007    Priority: High   Diverticulitis of intestine with abscess 07/03/2015    Priority: Medium    CKD (chronic kidney disease), stage III (HCC) 10/23/2014    Priority: Medium    Hyperlipidemia associated with type 2 diabetes mellitus (HCC) 04/18/2007    Priority: Medium    Essential hypertension 04/18/2007    Priority: Medium    Raynaud's disease 10/08/2013    Priority: Low   Osteoarthritis 07/01/2008    Priority: Low   Varicose veins 11/04/2007    Priority: Low   Osteopenia 04/18/2007    Priority: Low   Normocytic anemia 01/06/2024   Memory loss 01/06/2024   Globus sensation 01/06/2024   Carotid stenosis 01/06/2024   NSTEMI (non-ST elevated myocardial infarction) (HCC) 12/13/2023    Medications- reviewed and updated Current Outpatient Medications  Medication Sig Dispense Refill   aspirin EC 81 MG tablet Take 1 tablet (81 mg total) by mouth daily. Swallow whole. 30 tablet 12   atorvastatin (LIPITOR) 40 MG tablet Take 1 tablet (40 mg total) by mouth daily. 30 tablet 1   fluticasone (FLONASE) 50 MCG/ACT nasal spray Place 1 spray into both nostrils daily. (Patient taking differently: Place 1 spray into both nostrils as needed for allergies.) 16 g 0   losartan (COZAAR) 25 MG tablet Take 1 tablet (25 mg total) by mouth daily. 30 tablet 1   nitroGLYCERIN (NITROSTAT) 0.4 MG SL tablet Place 1 tablet (0.4 mg total) under the tongue every 5 (five) minutes  as needed for chest pain. 100 tablet 3   pantoprazole (PROTONIX) 40 MG tablet Take 1 tablet (40 mg total) by mouth daily. 30 tablet 1   No current facility-administered medications for this visit.     Objective:  BP 128/60   Pulse 88   Temp (!) 97.2 F (36.2 C)   Ht 5\' 3"  (1.6 m)   Wt 111 lb 9.6 oz (50.6 kg)   SpO2 97%   BMI 19.77 kg/m  Gen: NAD, resting comfortably CV: RRR no murmurs rubs or gallops Lungs: CTAB no crackles, wheeze, rhonchi Ext: no edema Skin: warm, dry Neuro: Hard of hearing-we talked about today's visit and she read my comments  Present today- Bessie Weinert- granddaughter in law took her to hospital     Assessment and Plan    # Hospital follow-up-patient's original plan for today was 1 month follow-up for unintentional weight loss. Patient with previous unintentional weight loss-had been down 9 pounds from July at last visit.  She was trying to eat more calories and increase protein and weight has been stable since last visit thankfully and actually up 1 pound.  Boost has been helpful. -Has had positive fecal occult blood but she has been uninterested in endoscopic evaluation and deemed not an ideal candidate.  Patient prefers palliative approach -Has also had globus sensation-is on proton pump inhibitor and wants to simply remain on that -The biggest issue is patient  recently had NSTEMI and is not considered a candidate for catheterization-see discussion below     #CAD- nstemi February 2025-conservative management only with 48 hours of heparin as was deemed poor candidate for cardiac catheterization-echocardiogram did show wall motion abnormalities and had some Lasix short-term # Carotid artery stenosis-symptomatic right-sided critical ICA stenosis but has opted out of surgery #hyperlipidemia-lipoprotein a not elevated  S: Medication: none in the past but restarted on atorvastatin in the hospital.  -As patient about chest pain shortness of breath and she  denies this but family seems to suggest she is having episodes of this at home   A/P: CAD with angina with recent NSTEMI not considered candidate for catheterization.  Similarly patient has significant carotid artery stenosis and not considered operative candidate and she also prefers not to have any operation-she prefers to be at home. - Discussed formal referral to palliative care or hospice and she declines both of these-she simply wants to live at home and enjoy her life is much as possible and reduce medical intervention --We opted for palliative approach even without formal palliative care or hospice referral (both were offered today as above)-family and patient aware she may pass in the coming months/weeks/days but she would prefer not to have outside people in her home such as hospice or palliative care -She is agreeable to continue losartan, aspirin and atorvastatin 40 mg - Family asks about home oxygen but we discussed not currently qualify for that-apparently she had 1 night where her oxygen dropped in hospital but I do not have record of this to be able to get her oxygen -At present has cardiology follow-up but she may decide to cancel that later -CHF noted but not requiring Lasix at this time #Diabetes S: Diet controlled.    Lab Results  Component Value Date   HGBA1C 6.3 (A) 12/03/2023   HGBA1C 6.6 (H) 05/30/2023   HGBA1C 6.9 (H) 11/21/2022  A/P: Diabetes has been well-controlled even without medication-we will discontinue checks with palliative approach   #Hypertension/CKD stage III S: Medication: Losartan 25 mg .   -Amlodipine has been discontinued, hydrochlorothiazide has been discontinued due to soft blood pressures as well -GFR is have been largely stable in the 50s.  Knows to avoid NSAIDs.  On ACE-i case proteinuric element.   BP Readings from Last 3 Encounters:  01/09/24 128/60  01/08/24 131/65  12/15/23 (!) 111/43   A/P: Blood pressure well-controlled-continue current  medication-multiple medications have been able to be discontinued with time CKD stage III largely stable in the hospital  Recommended follow up: Only as needed per patient as moving toward palliative approach Future Appointments  Date Time Provider Department Center  03/04/2024  1:00 PM Furth, Cadence H, PA-C [facility]    Lab/Order associations:   ICD-10-CM   1. Coronary artery disease involving native coronary artery of native heart with other form of angina pectoris (HCC)  I25.118     2. Acute on chronic diastolic CHF (congestive heart failure) (HCC)  I50.33     3. Controlled type 2 diabetes mellitus with stage 3 chronic kidney disease, without long-term current use of insulin (HCC)  E11.22    N18.30     4. Essential hypertension  I10     5. Hyperlipidemia associated with type 2 diabetes mellitus (HCC)  E11.69    E78.5       No orders of the defined types were placed in this encounter.   Return precautions advised.  Tana Conch, MD

## 2024-01-09 NOTE — Transitions of Care (Post Inpatient/ED Visit) (Signed)
   01/09/2024  Name: Linda Golden MRN: [id number] DOB: 07-Oct-1927  Today's TOC FU Call Status: Today's TOC FU Call Status:: Unsuccessful Call (1st Attempt) Unsuccessful Call (1st Attempt) Date: 01/09/24  Attempted to reach the patient regarding the most recent Inpatient/ED visit.  Follow Up Plan: Additional outreach attempts will be made to reach the patient to complete the Transitions of Care (Post Inpatient/ED visit) call.   Irving Shows [facility], BSN RN Care Manager/ Transition of Care [place]/ [facility] [phone]

## 2024-01-09 NOTE — Patient Instructions (Addendum)
 So good to see you today. We want to keep you as comfortable and happy as possible- let me know if there is anything I can do to help  Recommended follow up: you preferred just as needed follow up/open follow up- I'm here for you

## 2024-01-10 DIAGNOSIS — E1122 Type 2 diabetes mellitus with diabetic chronic kidney disease: Secondary | ICD-10-CM | POA: Diagnosis not present

## 2024-01-10 DIAGNOSIS — I214 Non-ST elevation (NSTEMI) myocardial infarction: Secondary | ICD-10-CM | POA: Diagnosis not present

## 2024-01-10 DIAGNOSIS — I5033 Acute on chronic diastolic (congestive) heart failure: Secondary | ICD-10-CM | POA: Diagnosis not present

## 2024-01-10 DIAGNOSIS — E1151 Type 2 diabetes mellitus with diabetic peripheral angiopathy without gangrene: Secondary | ICD-10-CM | POA: Diagnosis not present

## 2024-01-10 DIAGNOSIS — N183 Chronic kidney disease, stage 3 unspecified: Secondary | ICD-10-CM | POA: Diagnosis not present

## 2024-01-10 DIAGNOSIS — E785 Hyperlipidemia, unspecified: Secondary | ICD-10-CM | POA: Diagnosis not present

## 2024-01-10 DIAGNOSIS — I130 Hypertensive heart and chronic kidney disease with heart failure and stage 1 through stage 4 chronic kidney disease, or unspecified chronic kidney disease: Secondary | ICD-10-CM | POA: Diagnosis not present

## 2024-01-10 DIAGNOSIS — I7300 Raynaud's syndrome without gangrene: Secondary | ICD-10-CM | POA: Diagnosis not present

## 2024-01-10 DIAGNOSIS — E1169 Type 2 diabetes mellitus with other specified complication: Secondary | ICD-10-CM | POA: Diagnosis not present

## 2024-01-10 DIAGNOSIS — D631 Anemia in chronic kidney disease: Secondary | ICD-10-CM | POA: Diagnosis not present

## 2024-01-11 ENCOUNTER — Telehealth: Payer: Self-pay

## 2024-01-11 ENCOUNTER — Telehealth: Payer: Self-pay | Admitting: *Deleted

## 2024-01-11 NOTE — Telephone Encounter (Signed)
 This must of been set up by the hospital-patient declined home health with me yesterday as well

## 2024-01-11 NOTE — Transitions of Care (Post Inpatient/ED Visit) (Signed)
   01/11/2024  Name: Linda Golden MRN: [id number] DOB: [birth year]  Today's TOC FU Call Status: Today's TOC FU Call Status:: Unsuccessful Call (2nd Attempt) Unsuccessful Call (2nd Attempt) Date: 01/11/24  Attempted to reach the patient regarding the most recent Inpatient/ED visit. Granddaughter answered the phone and states she is not with patient,  RN Care Manager will attempt outreach another day.  Follow Up Plan: Additional outreach attempts will be made to reach the patient to complete the Transitions of Care (Post Inpatient/ED visit) call.   Irving Shows [facility], BSN RN Care Manager/ Transition of Care [place]/ [facility] [phone]

## 2024-01-11 NOTE — Telephone Encounter (Signed)
 FYI.  Copied from CRM [id number]. Topic: Clinical - Home Health Verbal Orders >> Jan 11, 2024  1:16 PM Fredrich Romans wrote: Caller/Agency: [facility] Callback Number: [phone] Vinnie Langton from [place] [facility] called to let provider know that she did not pick patient  up for any [facility] services. Patient did not want her to come back.

## 2024-01-14 ENCOUNTER — Telehealth: Payer: Self-pay | Admitting: *Deleted

## 2024-01-14 NOTE — Transitions of Care (Post Inpatient/ED Visit) (Signed)
 01/14/2024  Name: Linda Golden MRN: [id number] DOB: December 24, 1926  Today's TOC FU Call Status: Today's TOC FU Call Status:: Successful TOC FU Call Completed TOC FU Call Complete Date: 01/14/24 Patient's Name and Date of Birth confirmed.  Transition Care Management Follow-up Telephone Call Date of Discharge: 01/09/24 Discharge Facility: [facility]) Type of Discharge: Inpatient Admission Primary Inpatient Discharge Diagnosis:: non-ST elevated myocardial infarction How have you been since you were released from the hospital?: Better Any questions or concerns?: No  Items Reviewed: Did you receive and understand the discharge instructions provided?: Yes Medications obtained,verified, and reconciled?: Yes (Medications Reviewed) Any new allergies since your discharge?: No Dietary orders reviewed?: No Do you have support at home?: Yes People in Home: alone Name of Support/Comfort Primary Source: Bess Granddaughter  Medications Reviewed Today: Medications Reviewed Today     Reviewed by Luella Cook, RN (Case Manager) on 01/14/24 at 1529  Med List Status: <None>   Medication Order Taking? Sig Documenting Provider Last Dose Status Informant  aspirin EC 81 MG tablet [id number] Yes Take 1 tablet (81 mg total) by mouth daily. Swallow whole. Vassie Loll, MD Taking Active Family Member, Pharmacy Records  atorvastatin (LIPITOR) 40 MG tablet [id number] Yes Take 1 tablet (40 mg total) by mouth daily. Vassie Loll, MD Taking Active Family Member, Pharmacy Records  fluticasone [facility]) 50 MCG/ACT nasal spray [id number] Yes Place 1 spray into both nostrils daily.  Patient taking differently: Place 1 spray into both nostrils as needed for allergies.   Raspet, Noberto Retort, PA-C Taking Active Family Member, Pharmacy Records  losartan (COZAAR) 25 MG tablet [id number] Yes Take 1 tablet (25 mg total) by mouth daily. Vassie Loll, MD Taking Active Family Member, Pharmacy Records  nitroGLYCERIN  (NITROSTAT) 0.4 MG SL tablet [id number] Yes Place 1 tablet (0.4 mg total) under the tongue every 5 (five) minutes as needed for chest pain. Willeen Niece, MD Taking Active   pantoprazole (PROTONIX) 40 MG tablet [id number] Yes Take 1 tablet (40 mg total) by mouth daily. Vassie Loll, MD Taking Active Family Member, Pharmacy Records            Home Care and Equipment/Supplies: Were Home Health Services Ordered?: Yes Name of Home Health Agency:: [facility] Has Agency set up a time to come to your home?: Yes First Home Health Visit Date: 01/10/24 Any new equipment or medical supplies ordered?: NA  Functional Questionnaire: Do you need assistance with bathing/showering or dressing?: No Do you need assistance with meal preparation?: No Do you need assistance with eating?: No Do you have difficulty maintaining continence: No Do you need assistance with getting out of bed/getting out of a chair/moving?: No Do you have difficulty managing or taking your medications?: No  Follow up appointments reviewed: PCP Follow-up appointment confirmed?: Yes Date of PCP follow-up appointment?: 01/09/24 Follow-up Provider: [facility] Follow-up appointment confirmed?: Yes Date of Specialist follow-up appointment?: 03/04/24 Follow-Up Specialty Provider:: Cardiology Do you need transportation to your follow-up appointment?: No Do you understand care options if your condition(s) worsen?: Yes-patient verbalized understanding  SDOH Interventions Today    Flowsheet Row Most Recent Value  SDOH Interventions   Food Insecurity Interventions Intervention Not Indicated  Housing Interventions Intervention Not Indicated  Transportation Interventions Intervention Not Indicated, Patient Resources (Friends/Family)  Utilities Interventions Intervention Not Indicated      Interventions Today    Flowsheet Row Most Recent Value  Chronic Disease   Chronic disease during today's visit Other   [non-ST elevated myocardial  infarction]  General Interventions   General Interventions Discussed/Reviewed General Interventions Discussed, General Interventions Reviewed, Doctor Visits  Doctor Visits Discussed/Reviewed Doctor Visits Discussed, Doctor Visits Reviewed, PCP, Specialist  PCP/Specialist Visits Compliance with follow-up visit  Pharmacy Interventions   Pharmacy Dicussed/Reviewed Pharmacy Topics Discussed, Pharmacy Topics Reviewed      Patient care giver declined TOC follow up outreach calls  Gean Maidens BSN RN [facility] Health Care Management Coordinator [email or web address] Direct Dial: [phone]  Fax: [phone] Website: [email or web address]

## 2024-01-22 ENCOUNTER — Telehealth: Payer: Self-pay | Admitting: Family Medicine

## 2024-01-22 NOTE — Telephone Encounter (Signed)
 Received faxed  document Home Health Certificate (Order ID [id number] ), to be filled out by provider. Patient requested to send it back via Fax  Document is located in providers tray at front office.Please advise

## 2024-01-23 NOTE — Telephone Encounter (Signed)
Form has been completed and faxed back.

## 2024-02-05 ENCOUNTER — Encounter: Admitting: Vascular Surgery

## 2024-02-14 ENCOUNTER — Encounter: Admitting: Vascular Surgery

## 2024-02-29 ENCOUNTER — Encounter: Payer: Self-pay | Admitting: Medical

## 2024-02-29 ENCOUNTER — Telehealth: Payer: Self-pay | Admitting: Cardiology

## 2024-02-29 MED ORDER — ASPIRIN 81 MG PO TBEC: 81.0000 mg | DELAYED_RELEASE_TABLET | Freq: Every day | ORAL | 12 refills | Status: AC

## 2024-02-29 MED ORDER — LOSARTAN POTASSIUM 25 MG PO TABS: 25.0000 mg | ORAL_TABLET | Freq: Every day | ORAL | 1 refills | Status: DC

## 2024-02-29 MED ORDER — ATORVASTATIN CALCIUM 40 MG PO TABS: 40.0000 mg | ORAL_TABLET | Freq: Every day | ORAL | 1 refills | Status: DC

## 2024-02-29 NOTE — Telephone Encounter (Signed)
 Pt c/o medication issue:  1. Name of Medication:    losartan  (COZAAR ) 25 MG tablet    atorvastatin  (LIPITOR) 40 MG tablet    aspirin  EC 81 MG tablet    2. How are you currently taking this medication (dosage and times per day)? As written   3. Are you having a reaction (difficulty breathing--STAT)? No   4. What is your medication issue? Pt called in about her medications. She saw Dr. Londa Rival in the hospital and she has a f/u with APP  03/04/24. She is a little confused on what dose she should be taking on the losartan . PCP usually fills 100mg  tablets and she got a mail order delivered for those today, but she has been taking 25 mg tablet. She also needs a refill sent on the other two to [place]. Please advise.

## 2024-02-29 NOTE — Telephone Encounter (Signed)
 Error

## 2024-02-29 NOTE — Telephone Encounter (Signed)
 Per Hospitalist note:  5-hypertension -Soft blood pressure appreciated -Norvasc  has been discontinued -Continue treatment with losartan .  Dosage for Losartan  changed to 25 mg once daily   Patient stated that pharmacy delivered Losartan  100 mg and Hydrochlorothiazide  tablets to her home. Advised patient that she should be taking Losartan  25 mg once daily and HCTZ was discontinued by hospitalist.   Called pharmacy and advised that dosage for Losartan  and HCTZ was d/c'd per hospitalist note.

## 2024-03-04 ENCOUNTER — Ambulatory Visit: Attending: Medical | Admitting: Physician Assistant

## 2024-03-04 ENCOUNTER — Encounter: Payer: Self-pay | Admitting: Medical

## 2024-03-04 VITALS — BP 136/68 | HR 74 | Wt 116.0 lb

## 2024-03-04 DIAGNOSIS — E1169 Type 2 diabetes mellitus with other specified complication: Secondary | ICD-10-CM | POA: Diagnosis not present

## 2024-03-04 DIAGNOSIS — I10 Essential (primary) hypertension: Secondary | ICD-10-CM

## 2024-03-04 DIAGNOSIS — I5020 Unspecified systolic (congestive) heart failure: Secondary | ICD-10-CM | POA: Diagnosis not present

## 2024-03-04 DIAGNOSIS — Z79899 Other long term (current) drug therapy: Secondary | ICD-10-CM | POA: Diagnosis not present

## 2024-03-04 DIAGNOSIS — I6521 Occlusion and stenosis of right carotid artery: Secondary | ICD-10-CM

## 2024-03-04 DIAGNOSIS — D649 Anemia, unspecified: Secondary | ICD-10-CM | POA: Diagnosis not present

## 2024-03-04 DIAGNOSIS — I214 Non-ST elevation (NSTEMI) myocardial infarction: Secondary | ICD-10-CM | POA: Diagnosis not present

## 2024-03-04 DIAGNOSIS — E785 Hyperlipidemia, unspecified: Secondary | ICD-10-CM

## 2024-03-04 MED ORDER — DAPAGLIFLOZIN PROPANEDIOL 10 MG PO TABS: 10.0000 mg | ORAL_TABLET | Freq: Every day | ORAL | 0 refills | Status: DC

## 2024-03-04 MED ORDER — DAPAGLIFLOZIN PROPANEDIOL 10 MG PO TABS: 10.0000 mg | ORAL_TABLET | Freq: Every day | ORAL | 11 refills | Status: AC

## 2024-03-04 NOTE — Patient Instructions (Signed)
 Medication Instructions:   START Farxiga 10 mg daily   30 day Free samples provided to you today  Labwork: Fasting Lipids,CBC,BMET in 2 weeks (6/3)  Testing/Procedures: None today  Follow-Up: 2-3 months  Any Other Special Instructions Will Be Listed Below (If Applicable).  If you need a refill on your cardiac medications before your next appointment, please call your pharmacy.

## 2024-03-04 NOTE — Progress Notes (Signed)
 Cardiology Office Note:  .   Date:  03/04/2024  ID:  Linda Golden, DOB 29-Oct-1926, MRN [id number] PCP: Almira Jaeger, MD  [facility] Providers Cardiologist:  Teddie Favre, MD {  History of Present Illness: .   Linda Golden is a 89 y.o. female  with PMHx of NSTEMI (2/27-12/15/2023 & 3/24-25/2025: not a candidate for cath and recommended conservative management), HFrEF (ECHO 01/07/2024: LVEF 30 to 35%,  12/13/2023: 60-65%), Right carotid stenosis (12/18/2023: US  carotid: RICA 80-99% stenosis with string sign, medically managed per Vascular), PAD (01/03/2024 Vascular US  ABI: Severe right LE, moderate left lower LE, medically managed per Vascular), HTN, HLD, DM 2, CKDS3, normocytic anemia  who  reports to office for hospital follow up.   Patient was hospitalized from 2/27-12/15/2023 for NSTEMI.  Not a candidate for cath with concern for bleeding, unexplained weight loss, CKD stage III and severe hyperlipidemia reluctance to take medications. Treated medically with ASA, atorvastatin , heparin  drip, and losartan . At that time, not started on BB due to bradycardia or Plavix due to anemia and positive FOBT on 2/26.  Last seen in hospital consult 3/24-25/2025 for NSTEMI and acute HF.  BNP 2,396. Chest CT moderate bilateral effusions.  Echo with  LVEF 30 to 35%, moderately decreased LV function, global hypokinesis with akinesis in the inferior wall, mild concentric LVH, G3 DD, LV global longitudinal strain -15.3%, RV systolic function moderately reduced, mildly elevated PASP, LA mildly dilated, moderate to severe AVS. Received IV 20 mg Lasix  x 2 with urine output of 1.1 L.  Diuresis was held due to AKI.  Euvolemic on exam prior to discharge.  Not a suitable candidate for cardiac catheterization and recommended conservative management.  Still not started on BB or Plavix for similar reasons previous hospitalization.  DC'd amlodipine .  Started nitro as needed.  Continued on ASA 81 mg, Lipitor 40 mg, losartan   25 mg. Recommended palliative care.   On interview, patient is accompanied by daughter in law and history is obtained from both. Reported intermittent "weird feeling in chest" when she lays down that has been present since 11/2023 when she went to hospital. Unable to explain more or describe more details but states it does not stop her from going to sleep and she just knows it is there. Reported unchanged SOB with exertions that is relieved by rest. Reported unchanged claudications.  Denies orthopnea, PND, edema, dizziness, syncope, signs of bleeding. Report poor eating habitats.  Love to cook with salt. Activity is limited due to claudications.  Stays at home by self and able to take care of self and ADLs. Reports medication compliance.  Per daughter-in-law, patient refuses palliative care and in-home assistance and would like to remain independent staying home alone.  Studies Reviewed: Aaron Aas    ECHO 01/07/2024 IMPRESSIONS   1. Left ventricular ejection fraction, by estimation, is 30 to 35%. The  left ventricle has moderately decreased function. The left ventricle  demonstrates global hypokinesis with akinesis of the inferior wall. There  is mild concentric left ventricular  hypertrophy. Left ventricular diastolic parameters are consistent with  Grade III diastolic dysfunction (restrictive). The average left  ventricular global longitudinal strain is -15.3 %.   2. Right ventricular systolic function is moderately reduced. The right  ventricular size is normal. There is mildly elevated pulmonary artery  systolic pressure.   3. Left atrial size was mildly dilated.   4. The mitral valve is normal in structure. No evidence of mitral valve  regurgitation. No  evidence of mitral stenosis.   5. By Doppler AS appears mild. But visually AS appears quite severe.  Suspect moderate to severe low-gradient, low-flow AS. DI 0.50. The aortic  valve is tricuspid. There is moderate calcification of the aortic valve.   Aortic valve regurgitation is not  visualized. Moderate to severe aortic valve stenosis. Aortic valve area,  by VTI measures 1.59 cm. Aortic valve mean gradient measures 7.0 mmHg.  Aortic valve Vmax measures 1.81 m/s.   6. The inferior vena cava is normal in size with greater than 50%  respiratory variability, suggesting right atrial pressure of 3 mmHg.   US  carotids 01/07/2024 Summary:  Right Carotid: Velocities in the right ICA are consistent with a 80-99%                 stenosis, with evidence of string sign.  Vertebrals:  Right vertebral artery demonstrates antegrade flow.  Subclavians: Normal flow hemodynamics were seen in the right subclavian  artery.  Suggest Peripheral Vascular Consult.   Vascular US  ABI 01/03/2024 Summary:  Right: Resting right ankle-brachial index indicates severe right lower  extremity arterial disease. The right toe-brachial index is abnormal.   Left: Resting left ankle-brachial index indicates moderate left lower  extremity arterial disease. The left toe-brachial index is abnormal.   Physical Exam:   VS:  BP 136/68 (BP Location: Left Arm, Cuff Size: Normal)   Pulse 74   Wt 116 lb (52.6 kg)   SpO2 98%   BMI 20.55 kg/m    Wt Readings from Last 3 Encounters:  03/04/24 116 lb (52.6 kg)  01/09/24 111 lb 9.6 oz (50.6 kg)  01/08/24 110 lb (49.9 kg)    GEN: Well nourished, well developed in no acute distress NECK: No JVD; No carotid bruits CARDIAC: RRR, no murmurs, rubs, gallops RESPIRATORY:  Clear to auscultation without rales, wheezing or rhonchi  ABDOMEN: Soft, non-tender, non-distended EXTREMITIES:  No edema; No deformity   ASSESSMENT AND PLAN: .   NSTEMI HLD NSTEMI x 2 2/27-12/15/2023 & 3/24-25/2025: Not a suitable candidate for catheterization and recommended medication management due to concerns for bleeding risk. Not on BB due to bradycardia.  Not on clopidogrel due to bleeding risk. Recommenced Palliative care.  Today per daughter-in-law,  patient refused palliative care and in-home assistance and would like to remain independent staying home alone. 2023: LDL 158, 12/2023: K 3.5, Cr 1.26. ordered Lipid panel. Also ordered CBC to evaluate anemia.  Reported intermittent " weird feeling in chest" when laying down since 11/2023 hospital visit.  Does not stop her from going to sleep.  Discussed Imdur as an option but patient stated it does not bother that much to add an additional medication.  Continue ASA 81 mg, atorvastatin  40 mg and losartan  25 mg Will continue with medical management. No plans for further ischemic work up with  not a candidate for cath, frailty, and bleeding risk.   HFrEF ECHO 12/13/2023: 60-65%,  ECHO 01/07/2024: LVEF 30 to 35%, moderately decreased LV function, global hypokinesis with akinesis in the inferior wall, mild concentric LVH, G3 DD, LV global longitudinal strain -15.3%, RV systolic function moderately reduced, mildly elevated PASP, LA mildly dilated, moderate to severe AVS Today, reported SOB with exertion that is relieved with rest.  Continue losartan  25 mg Per chart review, due to her advanced age and frailty, she is not a candidate for aggressive GDMT. Not on BB due to bradycardia.  Discussed benefits of SGLT2 inhibitors and spironolactone to increase EF.  Patient  was very hesitant to add additional medication she feels like she is doing so well.  However, agreed to add just 1 medication. No history of UTI's. Ordered Farxiga 10 mg daily.   Aortic Stenosis  ECHO 01/07/2024: moderate to severe AVS Denies dizziness or syncope. Avoid low blood pressures. Discussed staying hydrated.  Continue to follow. Will plan for repeat ECHO in 12/2024.   Right Carotid Stenosis  12/18/2023: US  carotid: RICA 80-99% stenosis with string sign Peripheral Vascular Consult 01/07/2024: Recommended continued medical management with ASA and statin due to her age and being asymptomatic.  Also noted that patient wanted to avoid any  surgical intervention.  Per chart review, No additional follow-up was needed.  Continue ASA and statin as above  PAD  01/03/2024 Vascular US  ABI: Severe right LE, moderate left lower LE  Same as above peripheral vascular consult.  Continue ASA and statin as above  HTN  BP in office today well controlled: 136/68 Continue losartan  25 mg    Dispo: Ordered Farxiga 10 mg daily. Ordered CBC/BMP/FLP in 2 weeks.   Signed, Metta Actis, PA-C

## 2024-03-18 ENCOUNTER — Ambulatory Visit: Payer: Self-pay | Admitting: Physician Assistant

## 2024-03-18 ENCOUNTER — Other Ambulatory Visit (HOSPITAL_COMMUNITY)
Admission: RE | Admit: 2024-03-18 | Discharge: 2024-03-18 | Disposition: A | Discharge status: Home / Self Care | Source: Ambulatory Visit | Attending: Physician Assistant | Admitting: Physician Assistant

## 2024-03-18 DIAGNOSIS — Z79899 Other long term (current) drug therapy: Secondary | ICD-10-CM | POA: Insufficient documentation

## 2024-03-18 DIAGNOSIS — I6521 Occlusion and stenosis of right carotid artery: Secondary | ICD-10-CM | POA: Insufficient documentation

## 2024-03-18 DIAGNOSIS — E785 Hyperlipidemia, unspecified: Secondary | ICD-10-CM | POA: Diagnosis not present

## 2024-03-18 DIAGNOSIS — D649 Anemia, unspecified: Secondary | ICD-10-CM | POA: Diagnosis not present

## 2024-03-18 DIAGNOSIS — E1169 Type 2 diabetes mellitus with other specified complication: Secondary | ICD-10-CM | POA: Insufficient documentation

## 2024-03-18 DIAGNOSIS — I5020 Unspecified systolic (congestive) heart failure: Secondary | ICD-10-CM | POA: Diagnosis not present

## 2024-03-18 DIAGNOSIS — I214 Non-ST elevation (NSTEMI) myocardial infarction: Secondary | ICD-10-CM | POA: Diagnosis not present

## 2024-03-18 DIAGNOSIS — I10 Essential (primary) hypertension: Secondary | ICD-10-CM | POA: Diagnosis not present

## 2024-03-18 LAB — LIPID PANEL
Cholesterol: 117 mg/dL (ref 0–200)
HDL: 52 mg/dL (ref >40)
LDL Cholesterol: 49 mg/dL (ref 0–99)
Total CHOL/HDL Ratio: 2.3 ratio
Triglycerides: 80 mg/dL (ref <150)
VLDL: 16 mg/dL (ref 0–40)

## 2024-03-18 LAB — BASIC METABOLIC PANEL WITH GFR
Anion gap: 8 (ref 5–15)
BUN: 27 mg/dL — ABNORMAL HIGH (ref 8–23)
CO2: 25 mmol/L (ref 22–32)
Calcium: 9.2 mg/dL (ref 8.9–10.3)
Chloride: 104 mmol/L (ref 98–111)
Creatinine, Ser: 1.08 mg/dL — ABNORMAL HIGH (ref 0.44–1.00)
GFR, Estimated: 47 mL/min — ABNORMAL LOW (ref >60)
Glucose, Bld: 131 mg/dL — ABNORMAL HIGH (ref 70–99)
Potassium: 4.4 mmol/L (ref 3.5–5.1)
Sodium: 137 mmol/L (ref 135–145)

## 2024-03-18 LAB — CBC
HCT: 40.7 % (ref 36.0–46.0)
Hemoglobin: 12.9 g/dL (ref 12.0–15.0)
MCH: 29.1 pg (ref 26.0–34.0)
MCHC: 31.7 g/dL (ref 30.0–36.0)
MCV: 91.7 fL (ref 80.0–100.0)
Platelets: 237 10*3/uL (ref 150–400)
RBC: 4.44 MIL/uL (ref 3.87–5.11)
RDW: 12.7 % (ref 11.5–15.5)
WBC: 9.8 10*3/uL (ref 4.0–10.5)
nRBC: 0 % (ref 0.0–0.2)

## 2024-04-01 ENCOUNTER — Ambulatory Visit (INDEPENDENT_AMBULATORY_CARE_PROVIDER_SITE_OTHER): Admitting: Family Medicine

## 2024-04-01 ENCOUNTER — Encounter: Payer: Self-pay | Admitting: Family Medicine

## 2024-04-01 VITALS — BP 138/76 | HR 67 | Temp 98.3°F | Ht 63.0 in | Wt 113.0 lb

## 2024-04-01 DIAGNOSIS — E1169 Type 2 diabetes mellitus with other specified complication: Secondary | ICD-10-CM | POA: Diagnosis not present

## 2024-04-01 DIAGNOSIS — I25118 Atherosclerotic heart disease of native coronary artery with other forms of angina pectoris: Secondary | ICD-10-CM | POA: Diagnosis not present

## 2024-04-01 DIAGNOSIS — Z7984 Long term (current) use of oral hypoglycemic drugs: Secondary | ICD-10-CM | POA: Diagnosis not present

## 2024-04-01 DIAGNOSIS — I6521 Occlusion and stenosis of right carotid artery: Secondary | ICD-10-CM | POA: Diagnosis not present

## 2024-04-01 DIAGNOSIS — N183 Chronic kidney disease, stage 3 unspecified: Secondary | ICD-10-CM

## 2024-04-01 DIAGNOSIS — E785 Hyperlipidemia, unspecified: Secondary | ICD-10-CM

## 2024-04-01 DIAGNOSIS — E1122 Type 2 diabetes mellitus with diabetic chronic kidney disease: Secondary | ICD-10-CM

## 2024-04-01 DIAGNOSIS — I10 Essential (primary) hypertension: Secondary | ICD-10-CM | POA: Diagnosis not present

## 2024-04-01 NOTE — Patient Instructions (Addendum)
 Hold off on labs today- we will do these next visit  No changes today- we considered going up on losartan  to 50 mg but we will hold steady until follow up at least   Recommended follow up: Return in about 4 months (around 08/01/2024) for followup or sooner if needed.Schedule b4 you leave.

## 2024-04-01 NOTE — Progress Notes (Signed)
 Phone [phone] In person visit   Subjective:   Linda Golden is a 89 y.o. year old very pleasant female patient who presents for/with See problem oriented charting Chief Complaint  Patient presents with   Medical Management of Chronic Issues   Hyperlipidemia   Diabetes    Past Medical History-  Patient Active Problem List   Diagnosis Date Noted   CAD (coronary artery disease) 01/09/2024    Priority: High   Acute on chronic diastolic CHF (congestive heart failure) (HCC) 01/06/2024    Priority: High   Carotid stenosis 01/06/2024    Priority: High   History of non-ST elevation myocardial infarction (NSTEMI) 12/13/2023    Priority: High   Controlled type 2 diabetes mellitus with renal manifestation (HCC) 04/18/2007    Priority: High   Diverticulitis of intestine with abscess 07/03/2015    Priority: Medium    CKD (chronic kidney disease), stage III (HCC) 10/23/2014    Priority: Medium    Hyperlipidemia associated with type 2 diabetes mellitus (HCC) 04/18/2007    Priority: Medium    Essential hypertension 04/18/2007    Priority: Medium    Raynaud's disease 10/08/2013    Priority: Low   Osteoarthritis 07/01/2008    Priority: Low   Varicose veins 11/04/2007    Priority: Low   Osteopenia 04/18/2007    Priority: Low   Normocytic anemia 01/06/2024   Memory loss 01/06/2024   Globus sensation 01/06/2024    Medications- reviewed and updated Current Outpatient Medications  Medication Sig Dispense Refill   aspirin  EC 81 MG tablet Take 1 tablet (81 mg total) by mouth daily. Swallow whole. 30 tablet 12   atorvastatin  (LIPITOR) 40 MG tablet Take 1 tablet (40 mg total) by mouth daily. 30 tablet 1   dapagliflozin  propanediol (FARXIGA ) 10 MG TABS tablet Take 1 tablet (10 mg total) by mouth daily before breakfast. 30 tablet 11   losartan  (COZAAR ) 25 MG tablet Take 1 tablet (25 mg total) by mouth daily. 30 tablet 1   nitroGLYCERIN  (NITROSTAT ) 0.4 MG SL tablet Place 1 tablet (0.4  mg total) under the tongue every 5 (five) minutes as needed for chest pain. 100 tablet 3   No current facility-administered medications for this visit.     Objective:  BP 138/76   Pulse 67   Temp 98.3 F (36.8 C)   Ht 5' 3 (1.6 m)   Wt 113 lb (51.3 kg)   SpO2 97%   BMI 20.02 kg/m  Gen: NAD, resting comfortably  No edema noted  Assessment and Plan   # Social update-still wants to take rather palliative approach and minimize interventions if possible but we are thankful she has done reasonably well after NSTEMI and heart failure episodes  # Prior unintentional weight loss-has stabilized with increasing protein and caloric intake.  She is up another 2 pounds from last visit and continues boost.  Positive fecal occult blood test in the past but uninterested in endoscopic evaluation plus not an ideal candidate at her age and with NSTEMI history (was deemed not a candidate for catheterization at last hospitalization)   #CAD- nstemi February 2025-conservative management only with 48 hours of heparin  as was deemed poor candidate for cardiac catheterization # Carotid artery stenosis-symptomatic right-sided critical ICA stenosis but has opted out of surgery #hyperlipidemia-lipoprotein a not elevated  S: Medication: Aspirin  80 mg,atorvastatin  40 mg -Had hair loss in the past on statin but restarted after NSTEMI on atorvastatin  40 mg and tolerating at present without issues -  Has nitroglycerin  but not needing Lab Results  Component Value Date   CHOL 117 03/18/2024   HDL 52 03/18/2024   LDLCALC 49 03/18/2024   LDLDIRECT 225.0 05/30/2023   TRIG 80 03/18/2024   CHOLHDL 2.3 03/18/2024     A/P: Patient with CAD and currently asymptomatic after prior NSTEMI in February but her activity level is minimal.  I am impressed that she continues to mow her lawn even if she is on riding mower.  She would still like to minimize medical interventions -Lipids much improved on atorvastatin -continue current  medication -Also has peripheral arterial disease in legs-declines intervention as well as for carotid stenosis  # Heart failure with reduced ejection fraction at 30 to 35% on 01/07/2024 decreased from prior to NSTEMI-losartan  25 mg, also started on Farxiga  10 mg-seems to be tolerating this without issues  # Prior anemia-interestingly enough resolved on repeat most recently  # Aortic stenosis-moderate to severe on 01/07/2024-no dizziness or syncope, should avoid low blood pressures-but we did discuss possibly increasing losartan  to 50 mg for heart remodeling but she ultimately declined, repeat echo March 2026 planned  #Diabetes S: Diet controlled but cardiology started Farxiga  for her heart.     Lab Results  Component Value Date   HGBA1C 6.3 (A) 12/03/2023   HGBA1C 6.6 (H) 05/30/2023   HGBA1C 6.9 (H) 11/21/2022  A/P: Suspect even further improved on Farxiga -continue current medication-check A1c next visit   #Hypertension/CKD stage III S: Medication: Losartan  25 mg .   -GFR is have been largely stable in the 50s.  Knows to avoid NSAIDs.  On ACE-i case proteinuric element.  BP Readings from Last 3 Encounters:  04/01/24 138/76  03/04/24 136/68  01/09/24 128/60   A/P: Blood pressure high acceptable and we considered increasing losartan  to 50 mg primarily for heart remodeling benefit-she declines and wants to stay on current dose-discussed balance of this with the aortic stenosis in fact she is preload dependent CKD 3-stable on most recent labs  Recommended follow up: Return in about 4 months (around 08/01/2024) for followup or sooner if needed.Schedule b4 you leave. Future Appointments  Date Time Provider Department Center  05/06/2024 10:40 AM [facility] VISIT 1 [facility]  06/04/2024  1:00 PM Gerard Knight, MD [facility]  08/01/2024 10:20 AM Almira Jaeger, MD [facility]    Lab/Order associations:   ICD-10-CM   1. Coronary artery disease involving  native coronary artery of native heart with other form of angina pectoris (HCC)  I25.118     2. Controlled type 2 diabetes mellitus with stage 3 chronic kidney disease, without long-term current use of insulin  (HCC)  E11.22    N18.30     3. Stage 3 chronic kidney disease, unspecified whether stage 3a or 3b CKD (HCC)  N18.30     4. Essential hypertension  I10     5. Hyperlipidemia associated with type 2 diabetes mellitus (HCC)  E11.69    E78.5     6. Stenosis of right carotid artery  I65.21       No orders of the defined types were placed in this encounter.   Return precautions advised.  Clarisa Crooked, MD

## 2024-04-10 DIAGNOSIS — H43393 Other vitreous opacities, bilateral: Secondary | ICD-10-CM | POA: Diagnosis not present

## 2024-04-17 DIAGNOSIS — B351 Tinea unguium: Secondary | ICD-10-CM | POA: Diagnosis not present

## 2024-04-17 DIAGNOSIS — L84 Corns and callosities: Secondary | ICD-10-CM | POA: Diagnosis not present

## 2024-04-17 DIAGNOSIS — E1142 Type 2 diabetes mellitus with diabetic polyneuropathy: Secondary | ICD-10-CM | POA: Diagnosis not present

## 2024-04-20 ENCOUNTER — Other Ambulatory Visit: Payer: Self-pay | Admitting: Cardiology

## 2024-05-06 ENCOUNTER — Ambulatory Visit (INDEPENDENT_AMBULATORY_CARE_PROVIDER_SITE_OTHER)

## 2024-05-06 VITALS — BP 120/70 | HR 93 | Temp 98.7°F | Ht 64.0 in | Wt 112.6 lb

## 2024-05-06 DIAGNOSIS — Z0000 Encounter for general adult medical examination without abnormal findings: Secondary | ICD-10-CM | POA: Diagnosis not present

## 2024-05-06 NOTE — Patient Instructions (Signed)
 Ms. Linda Golden , Thank you for taking time out of your busy schedule to complete your Annual Wellness Visit with me. I enjoyed our conversation and look forward to speaking with you again next year. I, as well as your care team,  appreciate your ongoing commitment to your health goals. Please review the following plan we discussed and let me know if I can assist you in the future. Your Game plan/ To Do List    Referrals: If you haven't heard from the office you've been referred to, please reach out to them at the phone provided.   Follow up Visits: Next Medicare AWV with our clinical staff: 05/12/25   Have you seen your provider in the last 6 months (3 months if uncontrolled diabetes)? Yes Next Office Visit with your provider: 08/01/24  Clinician Recommendations:Each day, aim for 6 glasses of water, plenty of protein in your diet and try to get up and walk/ stretch every hour for 5-10 minutes at a time.        This is a list of the screening recommended for you and due dates:  Health Maintenance  Topic Date Due   Zoster (Shingles) Vaccine (1 of 2) Never done   COVID-19 Vaccine (5 - 2024-25 season) 06/17/2023   Eye exam for diabetics  04/09/2024   Flu Shot  05/16/2024   Complete foot exam   05/29/2024   Hemoglobin A1C  06/01/2024   Medicare Annual Wellness Visit  05/06/2025   DTaP/Tdap/Td vaccine (3 - Td or Tdap) 03/01/2028   Pneumococcal Vaccine for age over 67  Completed   DEXA scan (bone density measurement)  Addressed   Hepatitis B Vaccine  Aged Out   HPV Vaccine  Aged Out   Meningitis B Vaccine  Aged Out    Advanced directives: (Copy Requested) Please bring a copy of your health care power of attorney and living will to the office to be added to your chart at your convenience. You can mail to [facility] [place] [address] [place], [place] [address] or email to [email or web address] Advance Care Planning is important because it:  [x]  Makes sure you receive the  medical care that is consistent with your values, goals, and preferences  [x]  It provides guidance to your family and loved ones and reduces their decisional burden about whether or not they are making the right decisions based on your wishes.  Follow the link provided in your after visit summary or read over the paperwork we have mailed to you to help you started getting your Advance Directives in place. If you need assistance in completing these, please reach out to us  so that we can help you!  See attachments for Preventive Care and Fall Prevention Tips.

## 2024-05-06 NOTE — Progress Notes (Signed)
 Subjective:   Linda Golden is a 89 y.o. who presents for a Medicare Wellness preventive visit.  As a reminder, Annual Wellness Visits don't include a physical exam, and some assessments may be limited, especially if this visit is performed virtually. We may recommend an in-person follow-up visit with your provider if needed.  Visit Complete: In person    Persons Participating in Visit: Patient assisted by Granddaughter Josefine Novak .  AWV Questionnaire: No: Patient Medicare AWV questionnaire was not completed prior to this visit.  Cardiac Risk Factors include: advanced age (>18men, >58 women);dyslipidemia;diabetes mellitus;hypertension     Objective:    Today's Vitals   05/06/24 1030 05/06/24 1036  BP: 120/70   Pulse: 93   Temp: 98.7 F (37.1 C)   SpO2: 94%   Weight: 112 lb 9.6 oz (51.1 kg)   Height: 5' 4 (1.626 m)   PainSc:  1    Body mass index is 19.33 kg/m.     05/06/2024   10:45 AM 01/06/2024    9:12 PM 12/13/2023    3:00 PM 12/13/2023   10:11 AM 04/16/2023   11:03 AM 03/31/2022    9:01 AM 12/13/2020   11:54 AM  Advanced Directives  Does Patient Have a Medical Advance Directive? Yes Yes No No Yes No No  Type of Estate agent of [place];Living will Living will   Healthcare Power of [place];Living will    Does patient want to make changes to medical advance directive?  No - Patient declined       Copy of Healthcare Power of Attorney in Chart? No - copy requested    No - copy requested    Would patient like information on creating a medical advance directive?   No - Patient declined No - Patient declined  No - Patient declined No - Patient declined    Current Medications (verified) Outpatient Encounter Medications as of 05/06/2024  Medication Sig   aspirin  EC 81 MG tablet Take 1 tablet (81 mg total) by mouth daily. Swallow whole.   atorvastatin  (LIPITOR) 40 MG tablet Take 1 tablet (40 mg total) by mouth daily.   dapagliflozin  propanediol  (FARXIGA ) 10 MG TABS tablet Take 1 tablet (10 mg total) by mouth daily before breakfast.   losartan  (COZAAR ) 25 MG tablet TAKE ONE TABLET BY MOUTH DAILY. STOP TAKING 100MG  DOSAGE PER OFFICE.   nitroGLYCERIN  (NITROSTAT ) 0.4 MG SL tablet Place 1 tablet (0.4 mg total) under the tongue every 5 (five) minutes as needed for chest pain.   No facility-administered encounter medications on file as of 05/06/2024.    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   Diabetes mellitus    Hyperlipidemia    Hypertension    Kidney mass    Was told was born with this   Osteopenia    Past Surgical History:  Procedure Laterality Date   CATARACT EXTRACTION     bilateral planned-right done, left soon   foot spur     Family History  Problem Relation Age of Onset   Alzheimer's disease Mother    Hypertension Father    Social History   Socioeconomic History   Marital status: Widowed    Spouse name: Not on file   Number of children: Not on file   Years of education: Not on file   Highest education level: Not on  file  Occupational History   Not on file  Tobacco Use   Smoking status: Never   Smokeless tobacco: Never  Vaping Use   Vaping status: Never Used  Substance and Sexual Activity   Alcohol use: No   Drug use: No   Sexual activity: Not Currently  Other Topics Concern   Not on file  Social History Narrative   Widowed 2004. 1 son. 2 adopted grandchildren, 2 greatgrandkids. Assists in the care of autistic grandchild    [place]. Son lives close as well as grandson (next door). Completely independent. Still mows her yard.       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       Hobbies: reading, work outside      [employer] of [employer]: Low Risk  (05/06/2024)   Overall Financial Resource Strain (CARDIA)    Difficulty of Paying  Living Expenses: Not hard at all  Food Insecurity: No Food Insecurity (05/06/2024)   Hunger Vital Sign    Worried About Running Out of Food in the Last Year: Never true    Ran Out of Food in the Last Year: Never true  Transportation Needs: No Transportation Needs (05/06/2024)   PRAPARE - Administrator, Civil Service (Medical): No    Lack of Transportation (Non-Medical): No  Physical Activity: Inactive (05/06/2024)   Exercise Vital Sign    Days of Exercise per Week: 0 days    Minutes of Exercise per Session: 0 min  Stress: No Stress Concern Present (05/06/2024)   [employer] of Occupational Health - Occupational Stress Questionnaire    Feeling of Stress: Not at all  Social Connections: Moderately Isolated (05/06/2024)   Social Connection and Isolation Panel    Frequency of Communication with Friends and Family: More than three times a week    Frequency of Social Gatherings with Friends and Family: More than three times a week    Attends Religious Services: 1 to 4 times per year    Active Member of [employer] or Organizations: No    Attends Banker Meetings: Never    Marital Status: Widowed    Tobacco Counseling Counseling given: Not Answered    Clinical Intake:  Pre-visit preparation completed: Yes  Pain : 0-10 Pain Score: 1  Pain Type: Chronic pain Pain Location: Leg Pain Descriptors / Indicators: Other (Comment) (circulations issues)     BMI - recorded: 19.33 Nutritional Status: BMI of 19-24  Normal Nutritional Risks: None Diabetes: Yes CBG done?: No Did pt. bring in CBG monitor from home?: No  Lab Results  Component Value Date   HGBA1C 6.3 (A) 12/03/2023   HGBA1C 6.6 (H) 05/30/2023   HGBA1C 6.9 (H) 11/21/2022     How often do you need to have someone help you when you read instructions, pamphlets, or other written materials from your doctor or pharmacy?: 1 - Never  Interpreter Needed?: No  Information entered by :: Ellouise Haws,  LPN   Activities of Daily Living     05/06/2024   10:39 AM 01/06/2024    9:12 PM  In your present state of health, do you have any difficulty performing the following activities:  Hearing? 1 1  Comment has hearing aids HOH   Vision? 0 1  Difficulty concentrating or making decisions? 0 0  Walking or climbing stairs? 0   Dressing or bathing? 0   Doing errands, shopping? 0 0  Preparing Food  and eating ? N   Using the Toilet? N   In the past six months, have you accidently leaked urine? Y   Comment wears a pad   Do you have problems with loss of bowel control? N   Managing your Medications? N   Managing your Finances? N   Housekeeping or managing your Housekeeping? N     Patient Care Team: Katrinka Garnette KIDD, MD as PCP - General (Family Medicine) Debera Jayson MATSU, MD as PCP - Cardiology (Cardiology) Nicholaus Sherlean CROME, [facility] (Inactive) (Pharmacist)  I have updated your Care Teams any recent Medical Services you may have received from other providers in the past year.     Assessment:   This is a routine wellness examination for Madeliene.  Hearing/Vision screen Hearing Screening - Comments:: HOH has hearing aids  Vision Screening - Comments:: Wears rx glasses - up to date with routine eye exams with [facility] Dr benton    Goals Addressed   None    Depression Screen     05/06/2024   10:42 AM 04/16/2023   11:01 AM 11/21/2022   10:54 AM 03/31/2022    9:00 AM 04/22/2021    2:48 PM 12/13/2020   11:53 AM 11/06/2019   11:04 AM  PHQ 2/9 Scores  PHQ - 2 Score 1 0 0 0 0 0 0  PHQ- 9 Score   2        Fall Risk     05/06/2024   10:45 AM 04/16/2023   11:04 AM 11/21/2022   10:45 AM 03/31/2022    9:03 AM 06/14/2021   10:31 AM  Fall Risk   Falls in the past year? 0 1 1 1 1   Number falls in past yr: 0 1 0 1 0  Injury with Fall? 0 0 0 1 1  Comment    right arm and knee Pain in knee and arm last Sunday had a fall in the kitchen  Risk for fall due to : Impaired balance/gait;Impaired mobility  Impaired vision;Impaired balance/gait No Fall Risks Impaired vision   Follow up Falls prevention discussed Falls prevention discussed Falls evaluation completed Falls prevention discussed       Data saved with a previous flowsheet row definition    MEDICARE RISK AT HOME:  Medicare Risk at Home Any stairs in or around the home?: Yes If so, are there any without handrails?: No Home free of loose throw rugs in walkways, pet beds, electrical cords, etc?: Yes Adequate lighting in your home to reduce risk of falls?: Yes Life alert?: No Use of a cane, walker or w/c?: No Grab bars in the bathroom?: Yes Shower chair or bench in shower?: Yes Elevated toilet seat or a handicapped toilet?: Yes  TIMED UP AND GO:  Was the test performed?  Yes  Length of time to ambulate 10 feet: 15 sec Gait slow and steady without use of assistive device  Cognitive Function: 6CIT completed        05/06/2024   10:47 AM 04/18/2023   12:49 PM 03/31/2022    9:08 AM 11/06/2019   11:05 AM 05/30/2017   11:13 AM  6CIT Screen  What Year? 0 points 0 points 0 points 0 points 0 points  What month? 0 points 0 points 0 points 0 points 0 points  What time? 0 points 0 points 0 points 0 points 0 points  Count back from 20 0 points 0 points 0 points 0 points 0 points  Months in reverse 4  points 0 points 0 points 0 points 0 points  Repeat phrase 6 points 0 points 0 points 0 points 0 points  Total Score 10 points 0 points 0 points 0 points 0 points    Immunizations Immunization History  Administered Date(s) Administered   Fluad Quad(high Dose 65+) 08/24/2020, 11/21/2022   Influenza Split 07/27/2011, 07/08/2012   Influenza Whole 07/16/2006, 08/22/2007, 07/01/2008, 07/30/2009, 06/29/2010   Influenza, High Dose Seasonal PF 08/18/2016, 08/03/2017, 08/19/2018, 07/25/2023   Influenza,inj,Quad PF,6+ Mos 08/01/2013, 08/05/2014, 07/13/2015   Influenza-Unspecified 08/19/1995   Moderna Covid-19 Vaccine Bivalent Booster 39yrs & up  07/14/2021   Moderna Sars-Covid-2 Vaccination 12/11/2019, 01/09/2020, 09/30/2020   Pneumococcal Conjugate-13 05/24/2015   Pneumococcal Polysaccharide-23 08/19/1995, 02/14/1999   Td 10/16/2005   Tdap 03/01/2018    Screening Tests Health Maintenance  Topic Date Due   Zoster Vaccines- Shingrix (1 of 2) Never done   COVID-19 Vaccine (5 - 2024-25 season) 06/17/2023   OPHTHALMOLOGY EXAM  04/09/2024   INFLUENZA VACCINE  05/16/2024   FOOT EXAM  05/29/2024   HEMOGLOBIN A1C  06/01/2024   Medicare Annual Wellness (AWV)  05/06/2025   DTaP/Tdap/Td (3 - Td or Tdap) 03/01/2028   Pneumococcal Vaccine: 50+ Years  Completed   DEXA SCAN  Addressed   Hepatitis B Vaccines  Aged Out   HPV VACCINES  Aged Out   Meningococcal B Vaccine  Aged Out    Health Maintenance  Health Maintenance Due  Topic Date Due   Zoster Vaccines- Shingrix (1 of 2) Never done   COVID-19 Vaccine (5 - 2024-25 season) 06/17/2023   OPHTHALMOLOGY EXAM  04/09/2024   Health Maintenance Items Addressed: See Nurse Notes at the end of this note  Additional Screening:  Vision Screening: Recommended annual ophthalmology exams for early detection of glaucoma and other disorders of the eye. Would you like a referral to an eye doctor? No    Dental Screening: Recommended annual dental exams for proper oral hygiene  Community Resource Referral / Chronic Care Management: CRR required this visit?  No   CCM required this visit?  No   Plan:    I have personally reviewed and noted the following in the patient's chart:   Medical and social history Use of alcohol, tobacco or illicit drugs  Current medications and supplements including opioid prescriptions. Patient is not currently taking opioid prescriptions. Functional ability and status Nutritional status Physical activity Advanced directives List of other physicians Hospitalizations, surgeries, and ER visits in previous 12 months Vitals Screenings to include cognitive,  depression, and falls Referrals and appointments  In addition, I have reviewed and discussed with patient certain preventive protocols, quality metrics, and best practice recommendations. A written personalized care plan for preventive services as well as general preventive health recommendations were provided to patient.   Ellouise VEAR Haws, LPN   2/77/7974   After Visit Summary: (In Person-Printed) AVS printed and given to the patient  Notes: Nothing significant to report at this time.

## 2024-06-04 ENCOUNTER — Ambulatory Visit: Admitting: Cardiology

## 2024-06-05 ENCOUNTER — Encounter: Payer: Self-pay | Admitting: Cardiology

## 2024-06-05 ENCOUNTER — Ambulatory Visit: Attending: Cardiology | Admitting: Cardiology

## 2024-06-05 VITALS — BP 138/60 | HR 76 | Ht 63.0 in | Wt 112.8 lb

## 2024-06-05 DIAGNOSIS — I10 Essential (primary) hypertension: Secondary | ICD-10-CM

## 2024-06-05 DIAGNOSIS — I259 Chronic ischemic heart disease, unspecified: Secondary | ICD-10-CM

## 2024-06-05 DIAGNOSIS — I5020 Unspecified systolic (congestive) heart failure: Secondary | ICD-10-CM

## 2024-06-05 DIAGNOSIS — I6521 Occlusion and stenosis of right carotid artery: Secondary | ICD-10-CM

## 2024-06-05 NOTE — Progress Notes (Signed)
    Cardiology Office Note  Date: 06/05/2024   ID: Linda Golden, DOB 09/25/1927, MRN [id number]  History of Present Illness: Linda Golden is a 89 y.o. female last seen in May by Ms. Sheron RIGGERS, I reviewed her note.  She is here today with family member for a follow-up visit.  She reports no angina or interval nitroglycerin  use, stable NYHA class II dyspnea.  No sudden palpitations or syncope.  Still lives in her own home and functions with ADLs.  We reviewed her medications.  She reports compliance with current regimen, no obvious intolerances.  Blood pressure reasonable today, no changes in therapy anticipated.  I did review her interval lab work showing LDL down to 49 in June.  Physical Exam: VS:  BP 138/60 (BP Location: Left Arm, Patient Position: Sitting, Cuff Size: Normal)   Pulse 76   Ht 5' 3 (1.6 m)   Wt 112 lb 12.8 oz (51.2 kg)   SpO2 98%   BMI 19.98 kg/m , BMI Body mass index is 19.98 kg/m.  Wt Readings from Last 3 Encounters:  06/05/24 112 lb 12.8 oz (51.2 kg)  05/06/24 112 lb 9.6 oz (51.1 kg)  04/01/24 113 lb (51.3 kg)    General: Patient appears comfortable at rest. HEENT: Conjunctiva and lids normal. Neck: Supple, no elevated JVP or carotid bruits. Lungs: Clear to auscultation, nonlabored breathing at rest. Cardiac: Regular rate and rhythm, no S3, 2/6 systolic murmur. Extremities: No pitting edema.  ECG:  An ECG dated 01/06/2024 was personally reviewed today and demonstrated:  Sinus rhythm with decreased R wave progression and nonspecific ST changes.  Labwork: 12/03/2023: TSH 3.95 12/13/2023: ALT 14; AST 25 01/06/2024: B Natriuretic Peptide 2,396.4 01/08/2024: Magnesium 2.2 03/18/2024: BUN 27; Creatinine, Ser 1.08; Hemoglobin 12.9; Platelets 237; Potassium 4.4; Sodium 137     Component Value Date/Time   CHOL 117 03/18/2024 0933   TRIG 80 03/18/2024 0933   HDL 52 03/18/2024 0933   CHOLHDL 2.3 03/18/2024 0933   VLDL 16 03/18/2024 0933   LDLCALC 49 03/18/2024  0933   LDLCALC 158 (H) 05/19/2022 1532   LDLDIRECT 225.0 05/30/2023 1045   Other Studies Reviewed Today:  No interval cardiac testing for review today.  Assessment and Plan:  1.  Ischemic heart disease status post medically managed NSTEMI in February/March of this year.  Felt to be poor candidate for cardiac catheterization given bleeding risk.  She is clinically stable with no increasing angina on medical therapy which now includes aspirin  81 mg daily, Farxiga  10 mg daily, Lipitor 40 mg daily, and as needed nitroglycerin .  LDL 49 in June.  Continue with present regimen.  2.  HFrEF, LVEF 30 to 35% by echocardiogram in March with wall motion abnormalities consistent with ischemic cardiomyopathy.  No fluid retention and stable NYHA class II dyspnea.  Continue Cozaar  25 mg daily and Farxiga  10 mg daily.  3.  Aortic stenosis, possibly at least moderate low flow/low gradient in the setting of cardiomyopathy.  Echocardiogram from March revealed mean AV gradient 7 mmHg with dimensionless index 0.5.  Would manage conservatively at this point.  4.  Severe RICA stenosis based on evaluation in March.  VVS consultation at that time noted with recommendation for medical therapy.  5.  Primary hypertension.  No change in current regimen.   Disposition:  Follow up 6 months.  Signed, Jayson JUDITHANN Sierras, M.D., F.A.C.C. [facility] at [facility]

## 2024-06-05 NOTE — Patient Instructions (Signed)
 Medication Instructions:  Your physician recommends that you continue on your current medications as directed. Please refer to the Current Medication list given to you today.  *If you need a refill on your cardiac medications before your next appointment, please call your pharmacy*  Lab Work: NONE   If you have labs (blood work) drawn today and your tests are completely normal, you will receive your results only by: MyChart Message (if you have MyChart) OR A paper copy in the mail If you have any lab test that is abnormal or we need to change your treatment, we will call you to review the results.  Testing/Procedures: NONE   Follow-Up: At [facility], you and your health needs are our priority.  As part of our continuing mission to provide you with exceptional heart care, our providers are all part of one team.  This team includes your primary Cardiologist (physician) and Advanced Practice Providers or APPs (Physician Assistants and Nurse Practitioners) who all work together to provide you with the care you need, when you need it.  Your next appointment:   6 month(s)  Provider:   Jayson Sierras, MD    We recommend signing up for the patient portal called MyChart.  Sign up information is provided on this After Visit Summary.  MyChart is used to connect with patients for Virtual Visits (Telemedicine).  Patients are able to view lab/test results, encounter notes, upcoming appointments, etc.  Non-urgent messages can be sent to your provider as well.   To learn more about what you can do with MyChart, go to [email or web address].   Other Instructions Thank you for choosing [facility]!

## 2024-06-12 ENCOUNTER — Encounter: Payer: Self-pay | Admitting: Pharmacist

## 2024-06-12 NOTE — Progress Notes (Signed)
 Pharmacy Quality Measure Review  This patient is appearing on a report for being at risk of failing the adherence measure for cholesterol (statin), diabetes, and hypertension (ACEi/ARB) medications this calendar year.   Medication: Farxiga  Last fill date: 04/14/2024 for 30 day supply  Medication: atorvastatin  Last fill date: 04/23/2024 for 30 day supply  Medication: losartan  Last fill date: 04/23/2024 for 30 day supply  Reviewed recent refill history in Dr Annemarie database. Actual last refill date was 05/16/2024 or 05/17/2024 for 30 day supply for medications listed above.  Patient has 9 refills remaining on Farxiga , 2 refills remaining for 90 DS for atorvastatin  and losartan . Next appointment with PCP is 08/01/2024.   I spoke to [facility] to see if maybe the patient was using adherence packaging. She is not currently. Asked about getting 90 - 100 days filled in future and they placed a note in her chart to ask about 90-100 day supply.   I also spoke with patient's granddaughter. She reports they are thinking out starting adherence packaging because Mrs. Mabey has reached out to the family a few times and seemed a little confused about if she had taken her medications or not.  Provided information about adherence packing available at local pharmacies in [facility] and also about [facility] adherence packaging. Granddaughter will discuss with her grandmother and family and let me know about assisting with transition.   Madelin Ray, PharmD Clinical Pharmacist [facility] Primary Care  Population Health [phone]

## 2024-06-26 ENCOUNTER — Other Ambulatory Visit: Payer: Self-pay | Admitting: Pharmacist

## 2024-06-26 DIAGNOSIS — L84 Corns and callosities: Secondary | ICD-10-CM | POA: Diagnosis not present

## 2024-06-26 DIAGNOSIS — B351 Tinea unguium: Secondary | ICD-10-CM | POA: Diagnosis not present

## 2024-06-26 DIAGNOSIS — E1142 Type 2 diabetes mellitus with diabetic polyneuropathy: Secondary | ICD-10-CM | POA: Diagnosis not present

## 2024-06-26 NOTE — Progress Notes (Signed)
 06/26/2024 Name: Linda Golden MRN: [id number] DOB: [birth year]  Chief Complaint  Patient presents with   Medication Adherence    Linda Golden is a 89 y.o. year old female. Spoke with Linda Golden Golden today. 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    They were referred to the pharmacist by a quality report for assistance in managing complex medication management.    Subjective:  Care Team: Primary Care Provider: Katrinka Garnette KIDD, MD ; Next Scheduled Visit: 08/01/2024  Medication Access/Adherence  Current Pharmacy:  [facility] - [place], [place] - [address] [place] [address] Phone: [phone] Fax: [phone]  [facility] - [place], [place] - [address] [facility] AT Portal to Registered Caremark Sites [address] [place] [address] Phone: [phone] Fax: [phone]   Patient reports affordability concerns with their medications: No  Patient reports access/transportation concerns to their pharmacy: No  Patient reports adherence concerns with their medications:  No      This patient is appearing on a report for being at risk of failing the adherence measure for cholesterol (statin), diabetes, and hypertension (ACEi/ARB) medications this calendar year. I spoke with Linda Golden 2 weeks ago about adherence and ways to improve adherence. She wanted to discuss the option of using adherence packages with the family and Linda Golden. Bess Linda Golden did discuss with Linda Golden son and he thought adherence packs would be a good options but they would like to discuss with Linda Golden first and they have not had the chance to do this yet.   Current medications:  Medication: Farxiga  Last fill date: 06/20/2024 for 30 day supply   Medication: atorvastatin  Last fill date: 05/16/2024 for 30 day supply   Medication: losartan  Last fill date: 05/16/2024 for 30 day  supply    Objective:  Lab Results  Component Value Date   HGBA1C 6.3 (A) 12/03/2023    Lab Results  Component Value Date   CREATININE 1.08 (H) 03/18/2024   BUN 27 (H) 03/18/2024   NA 137 03/18/2024   K 4.4 03/18/2024   CL 104 03/18/2024   CO2 25 03/18/2024    Lab Results  Component Value Date   CHOL 117 03/18/2024   HDL 52 03/18/2024   LDLCALC 49 03/18/2024   LDLDIRECT 225.0 05/30/2023   TRIG 80 03/18/2024   CHOLHDL 2.3 03/18/2024    Medications Reviewed Today     Reviewed by Carla Milling, RPH-CPP (Pharmacist) on 06/26/24 at 1142  Med List Status: <None>   Medication Order Taking? Sig Documenting Provider Last Dose Status Informant  aspirin  EC 81 MG tablet [id number] Yes Take 1 tablet (81 mg total) by mouth daily. Swallow whole. Debera Jayson MATSU, MD  Active   atorvastatin  (LIPITOR) 40 MG tablet [id number] Yes Take 1 tablet (40 mg total) by mouth daily. Sheron Lorette GRADE, PA-C  Active   dapagliflozin  propanediol (FARXIGA ) 10 MG TABS tablet [id number] Yes Take 1 tablet (10 mg total) by mouth daily before breakfast. Sheron Lorette GRADE, PA-C  Active   losartan  (COZAAR ) 25 MG tablet [id number] Yes TAKE ONE TABLET BY MOUTH DAILY. STOP TAKING 100MG  DOSAGE PER OFFICE. Sheron Lorette GRADE, PA-C  Active   nitroGLYCERIN  (NITROSTAT ) 0.4 MG SL tablet [id number] Yes Place 1 tablet (0.4 mg total) under the tongue every 5 (five) minutes as needed for chest pain. Leotis Bogus, MD  Active  Assessment/Plan:   Medication Management: - Current strategy insufficient to maintain appropriate adherence to prescribed medication regimen - Suggested use of weekly pill box, bubble packs or adherence packaging. Called Linda current pharmacy [facility] - they have bubble packs and could do a 2 week trial if patient is interested. Linda Golden will discuss with patient.  - Reminded Linda Golden that patient is past due to fill both atorvastatin  and losartan . She  will discuss with Linda Golden. There are refills remaining for both losartan  and atorvastatin .   - Linda current Rx at [place] is for 90 days and they can request to have filled fro 90 day supply.   Follow Up Plan: Sees PCP again 08/01/2024 Family will let me know if they would like to try either bubble packs or adherence packs.   Madelin Ray, PharmD Clinical Pharmacist [facility] Primary Care  Population Health [phone]

## 2024-07-11 ENCOUNTER — Encounter: Payer: Self-pay | Admitting: Pharmacist

## 2024-07-11 NOTE — Progress Notes (Signed)
 Pharmacy Quality Measure Review  This patient is appearing on a report for being at risk of failing the adherence measure for cholesterol (statin) and hypertension (ACEi/ARB) medications this calendar year.   Medication: atorvastatin   Last fill date: 05/16/2024 for 30 day supply   Medication: losartan  25mg  (was on 100mg  earlier in 2025) Last fill date: 05/16/2024 for 30 day supply  Reviewed recent refill history in Dr Annemarie database. Actual last refill date was 07/02/2024 for 30 day supply - patient has filled since last outreach.  Patient has 3 refills remaining. Next appointment with PCP is 08/01/2024.    Insurance report was not up to date. No action needed at this time.  Will continue to follow. Would like to change to 90 or 100 day supply but patient is considering adherence packs to will leave at 30 DS for now.   Madelin Ray, PharmD Clinical Pharmacist [facility] Primary Care  Population Health [phone]

## 2024-08-01 ENCOUNTER — Encounter: Payer: Self-pay | Admitting: Family Medicine

## 2024-08-01 ENCOUNTER — Ambulatory Visit: Admitting: Family Medicine

## 2024-08-01 ENCOUNTER — Other Ambulatory Visit: Admitting: Pharmacist

## 2024-08-01 VITALS — BP 128/70 | HR 69 | Temp 98.1°F | Ht 63.0 in | Wt 110.6 lb

## 2024-08-01 DIAGNOSIS — H9193 Unspecified hearing loss, bilateral: Secondary | ICD-10-CM | POA: Diagnosis not present

## 2024-08-01 DIAGNOSIS — Z7984 Long term (current) use of oral hypoglycemic drugs: Secondary | ICD-10-CM

## 2024-08-01 DIAGNOSIS — I10 Essential (primary) hypertension: Secondary | ICD-10-CM | POA: Diagnosis not present

## 2024-08-01 DIAGNOSIS — E785 Hyperlipidemia, unspecified: Secondary | ICD-10-CM

## 2024-08-01 DIAGNOSIS — N183 Chronic kidney disease, stage 3 unspecified: Secondary | ICD-10-CM | POA: Diagnosis not present

## 2024-08-01 DIAGNOSIS — I25118 Atherosclerotic heart disease of native coronary artery with other forms of angina pectoris: Secondary | ICD-10-CM | POA: Diagnosis not present

## 2024-08-01 DIAGNOSIS — Z23 Encounter for immunization: Secondary | ICD-10-CM

## 2024-08-01 DIAGNOSIS — E1169 Type 2 diabetes mellitus with other specified complication: Secondary | ICD-10-CM | POA: Diagnosis not present

## 2024-08-01 DIAGNOSIS — E1129 Type 2 diabetes mellitus with other diabetic kidney complication: Secondary | ICD-10-CM

## 2024-08-01 LAB — CBC WITH DIFFERENTIAL/PLATELET
Basophils Absolute: 0.1 K/uL (ref 0.0–0.1)
Basophils Relative: 1.2 % (ref 0.0–3.0)
Eosinophils Absolute: 0.1 K/uL (ref 0.0–0.7)
Eosinophils Relative: 1.3 % (ref 0.0–5.0)
HCT: 39.4 % (ref 36.0–46.0)
Hemoglobin: 12.8 g/dL (ref 12.0–15.0)
Lymphocytes Relative: 30.7 % (ref 12.0–46.0)
Lymphs Abs: 2.2 K/uL (ref 0.7–4.0)
MCHC: 32.4 g/dL (ref 30.0–36.0)
MCV: 88.2 fl (ref 78.0–100.0)
Monocytes Absolute: 0.5 K/uL (ref 0.1–1.0)
Monocytes Relative: 7.6 % (ref 3.0–12.0)
Neutro Abs: 4.2 K/uL (ref 1.4–7.7)
Neutrophils Relative %: 59.2 % (ref 43.0–77.0)
Platelets: 184 K/uL (ref 150.0–400.0)
RBC: 4.47 Mil/uL (ref 3.87–5.11)
RDW: 14.3 % (ref 11.5–15.5)
WBC: 7.2 K/uL (ref 4.0–10.5)

## 2024-08-01 LAB — COMPREHENSIVE METABOLIC PANEL WITH GFR
ALT: 11 U/L (ref 0–35)
AST: 17 U/L (ref 0–37)
Albumin: 4.3 g/dL (ref 3.5–5.2)
Alkaline Phosphatase: 69 U/L (ref 39–117)
BUN: 22 mg/dL (ref 6–23)
CO2: 28 meq/L (ref 19–32)
Calcium: 9.4 mg/dL (ref 8.4–10.5)
Chloride: 105 meq/L (ref 96–112)
Creatinine, Ser: 1.08 mg/dL (ref 0.40–1.20)
GFR: 43.7 mL/min — ABNORMAL LOW (ref >60.00)
Glucose, Bld: 106 mg/dL — ABNORMAL HIGH (ref 70–99)
Potassium: 4.1 meq/L (ref 3.5–5.1)
Sodium: 141 meq/L (ref 135–145)
Total Bilirubin: 0.7 mg/dL (ref 0.2–1.2)
Total Protein: 7.2 g/dL (ref 6.0–8.3)

## 2024-08-01 LAB — LIPID PANEL
Cholesterol: 143 mg/dL (ref 0–200)
HDL: 52.9 mg/dL (ref >39.00)
LDL Cholesterol: 66 mg/dL (ref 0–99)
NonHDL: 89.92
Total CHOL/HDL Ratio: 3
Triglycerides: 122 mg/dL (ref 0.0–149.0)
VLDL: 24.4 mg/dL (ref 0.0–40.0)

## 2024-08-01 NOTE — Progress Notes (Signed)
 Phone [phone] In person visit   Subjective:   Linda Golden is a 89 y.o. year old very pleasant female patient who presents for/with See problem oriented charting Chief Complaint  Patient presents with   Medical Management of Chronic Issues    Pt is fasting;    Hyperlipidemia   Diabetes   Past Medical History-  Patient Active Problem List   Diagnosis Date Noted   CAD (coronary artery disease) 01/09/2024    Priority: High   Acute on chronic diastolic CHF (congestive heart failure) (HCC) 01/06/2024    Priority: High   Carotid stenosis 01/06/2024    Priority: High   History of non-ST elevation myocardial infarction (NSTEMI) 12/13/2023    Priority: High   Controlled type 2 diabetes mellitus with renal manifestation (HCC) 04/18/2007    Priority: High   Diverticulitis of intestine with abscess 07/03/2015    Priority: Medium    CKD (chronic kidney disease), stage III (HCC) 10/23/2014    Priority: Medium    Hyperlipidemia associated with type 2 diabetes mellitus (HCC) 04/18/2007    Priority: Medium    Essential hypertension 04/18/2007    Priority: Medium    Raynaud's disease 10/08/2013    Priority: Low   Osteoarthritis 07/01/2008    Priority: Low   Varicose veins 11/04/2007    Priority: Low   Osteopenia 04/18/2007    Priority: Low   Normocytic anemia 01/06/2024   Memory loss 01/06/2024   Globus sensation 01/06/2024    Medications- reviewed and updated Current Outpatient Medications  Medication Sig Dispense Refill   aspirin  EC 81 MG tablet Take 1 tablet (81 mg total) by mouth daily. Swallow whole. 30 tablet 12   atorvastatin  (LIPITOR) 40 MG tablet Take 1 tablet (40 mg total) by mouth daily. 90 tablet 3   dapagliflozin  propanediol (FARXIGA ) 10 MG TABS tablet Take 1 tablet (10 mg total) by mouth daily before breakfast. 30 tablet 11   losartan  (COZAAR ) 25 MG tablet TAKE ONE TABLET BY MOUTH DAILY. STOP TAKING 100MG  DOSAGE PER OFFICE. 90 tablet 3   nitroGLYCERIN   (NITROSTAT ) 0.4 MG SL tablet Place 1 tablet (0.4 mg total) under the tongue every 5 (five) minutes as needed for chest pain. 100 tablet 3   No current facility-administered medications for this visit.     Objective:  BP 128/70 (BP Location: Left Arm, Patient Position: Sitting, Cuff Size: Normal)   Pulse 69   Temp 98.1 F (36.7 C) (Temporal)   Ht 5' 3 (1.6 m)   Wt 110 lb 9.6 oz (50.2 kg)   SpO2 95%   BMI 19.59 kg/m  Gen: NAD, resting comfortably CV: RRR no rubs or gallops Lungs: CTAB no crackles, wheeze, rhonchi Ext: no edema Skin: warm, dry Very hard of hearing  No claudication reported Diabetic foot exam was performed with the following findings:   No deformities, ulcerations, or other skin breakdown Normal sensation of 10g monofilament Intact posterior tibialis and dorsalis pedis pulses Pulses only 1+/weak        Assessment and Plan   # Hard of hearing-she has older hearing aids that she does not feel are beneficial-refer to audiology for updated evaluation  #CAD- nstemi February 2025-conservative management only with 48 hours of heparin  as was deemed poor candidate for cardiac catheterization-echocardiogram did show wall motion abnormalities also with carotid stenosis but has opted out of surgery though candidate  #hyperlipidemia-lipoprotein a not elevated  S: Medication: Aspirin  80 mg,atorvastatin  40 mg -no chest pain or shortness of breath  Lab Results  Component Value Date   CHOL 117 03/18/2024   HDL 52 03/18/2024   LDLCALC 49 03/18/2024   LDLDIRECT 225.0 05/30/2023   TRIG 80 03/18/2024   CHOLHDL 2.3 03/18/2024   A/P: CAD remains asymptomatic-continue atorvastatin  as well as aspirin  81 mg - Lipids at goal-continue current medication -Also has carotid artery disease and peripheral arterial disease-she has declined interventions in these areas and would be high risk for procedures regardless at her age  # Heart failure with reduced ejection fraction at 30 to  35% on 01/07/2024 decreased from prior to NSTEMI-losartan  25 mg, also started on Farxiga  10 mg  -Prior Lasix  20 mg during hospitalization in March 2025 after NSTEMI in February 2025 but has not required outpatient -no weight gain or edema. Compliant with medications.  -appears euvolemic- continue current medications   # Aortic stenosis-moderate to severe on 01/07/2024-no dizziness or syncope has been reported, repeat echo March 2026  #Diabetes S: Diet controlled but cardiology started Farxiga  for her heart.  Lab Results  Component Value Date   HGBA1C 6.3 (A) 12/03/2023   HGBA1C 6.6 (H) 05/30/2023   HGBA1C 6.9 (H) 11/21/2022  A/P: Diabetes has been well-controlled-update A1c today.  Thankful diet controlled other than Farxiga  for heart failure   #Hypertension/CKD stage III S: Medication: Losartan  25 mg .   -Amlodipine  has been discontinued, hydrochlorothiazide  has been discontinued due to soft blood pressures as well -GFR is have been largely stable in the 50s.  Knows to avoid NSAIDs.  On ACE-i case proteinuric element.   A/P: Blood pressure well-controlled.  GFR has been stable in the 40s-check today  Recommended follow up: Return in about 4 months (around 12/02/2024) for physical or sooner if needed.Schedule b4 you leave. Future Appointments  Date Time Provider Department Center  05/12/2025 10:40 AM [facility] ANNUAL WELLNESS VISIT 1 [facility] [place]    Lab/Order associations:   ICD-10-CM   1. Controlled type 2 diabetes mellitus with renal manifestation (HCC)  E11.29 Comprehensive metabolic panel with GFR    CBC with Differential/Platelet    Lipid panel    2. Bilateral hearing loss, unspecified hearing loss type  H91.93 Ambulatory referral to Audiology    3. Coronary artery disease involving native coronary artery of native heart with other form of angina pectoris  I25.118     4. Essential hypertension  I10     5. Stage 3 chronic kidney disease, unspecified whether stage 3a or  3b CKD (HCC)  N18.30     6. Hyperlipidemia associated with type 2 diabetes mellitus (HCC)  E11.69    E78.5     7. Immunization due  Z23 Flu vaccine HIGH DOSE PF(Fluzone Trivalent)      No orders of the defined types were placed in this encounter.   Return precautions advised.  Garnette Lukes, MD

## 2024-08-01 NOTE — Patient Instructions (Addendum)
 Flu shot today  We have placed a referral for you today to audiology- please call their # if you do not hear within a week (may be listed below or you may see mychart message within a few days with #).   Please stop by lab before you go If you have mychart- we will send your results within 3 business days of us  receiving them.  If you do not have mychart- we will call you about results within 5 business days of us  receiving them.  *please also note that you will see labs on mychart as soon as they post. I will later go in and write notes on them- will say notes from Dr. Katrinka   Recommended follow up: Return in about 4 months (around 12/02/2024) for physical or sooner if needed.Schedule b4 you leave.

## 2024-08-02 ENCOUNTER — Ambulatory Visit: Payer: Self-pay | Admitting: Family Medicine

## 2024-08-03 ENCOUNTER — Encounter: Payer: Self-pay | Admitting: Pharmacist

## 2024-08-03 NOTE — Progress Notes (Signed)
   08/03/2024  Patient ID: Linda Golden, female   DOB: [birth year], 89 y.o.   MRN: [id number]  Pharmacy Quality Measure Review  This patient is appearing on a report for being at risk of failing the adherence measure for cholesterol (statin) and hypertension (ACEi/ARB) medications this calendar year.   Medication: atorvastatin   Last fill date: 07/02/2024 for 30 day supply  Medication: losartan  25mg  (was on 100mg  earlier in 2025) Last fill date: 07/02/2024 for 30 day supply  Reviewed recent refill history in Dr Annemarie database. Actual last refill date was 07/31/2024 for 30 day supply - patient has filled since last outreach.  Patient has 2 refills remaining. Next appointment with PCP is 12/05/2024 (was seen by PCP last 08/01/2024)  She is also taking Farxiga  - lat refill was 07/23/2024 for 30 days - has 7 refills remaining.   Insurance report was not up to date. No action needed at this time.  Will continue to follow. Would like to change to 90 or 100 day supply but patient is considering adherence packs so will leave at 30 day supply for now.   Madelin Ray, PharmD Clinical Pharmacist [facility]  Population Health [phone]

## 2024-08-18 ENCOUNTER — Telehealth: Payer: Self-pay | Admitting: Family Medicine

## 2024-08-18 NOTE — Telephone Encounter (Signed)
 Noted.    Copied from CRM [id number]. Topic: Clinical - Lab/Test Results >> Aug 18, 2024 12:07 PM Delon DASEN wrote: Reason for CRM: granddaughter called about results, read note from provider, no questions

## 2024-09-08 ENCOUNTER — Ambulatory Visit: Attending: Family Medicine | Admitting: Audiology

## 2024-09-08 ENCOUNTER — Encounter: Payer: Self-pay | Admitting: Pharmacist

## 2024-09-08 DIAGNOSIS — H6121 Impacted cerumen, right ear: Secondary | ICD-10-CM | POA: Insufficient documentation

## 2024-09-08 NOTE — Progress Notes (Signed)
   09/08/2024  Patient ID: Linda Golden, female   DOB: [birth year], 89 y.o.   MRN: [id number]  Pharmacy Quality Measure Review  This patient is appearing on a report for being at risk of failing the adherence measure for cholesterol (statin) and hypertension (ACEi/ARB) medications this calendar year.   Medication: atorvastatin   Last fill date: 07/31/2024 for 30 day supply  Medication: losartan  25mg  (was on 100mg  earlier in 2025) Last fill date: 07/31/2024 for 30 day supply  Medication: Farxiga  Last fill date: 07/31/2024 for 30 day supply  Reviewed recent refill history in Dr Annemarie database. Actual last refill date was 09/03/2024 for 30 day supply. Patient has 2 refills remaining. Next appointment with PCP is 12/05/2024 (was seen by PCP last 08/01/2024)  Insurance report was not up to date. No action needed at this time.  Will continue to follow. Would like to change to 90 or 100 day supply but patient is considering adherence packs so will leave at 30 day supply for now.   Madelin Ray, PharmD Clinical Pharmacist [facility]  Population Health [phone]

## 2024-09-09 NOTE — Procedures (Signed)
  Outpatient Audiology and [facility] [address] [place], [place]  [address] [phone]  AUDIOLOGICAL  EVALUATION  NAME: DANEY MOOR     DOB:   Mar 09, 1927      MRN: [id number]                                                                                     DATE: 09/09/2024     REFERENT: Katrinka Garnette KIDD, MD STATUS: Outpatient DIAGNOSIS: sensorineural hearing loss   History: Jalissa was seen for an audiological evaluation. Lajean was accompanied to the appointment by her granddaughter. Peytyn reports hearing loss occurring for many years. Maylynn has receiver in the ear hearing aids from [facility] in [place]. Spencer reports she cannot hear well with the [facility] hearing aids. Dama is interested in new amplification.   Evaluation:  Otoscopy showed excessive cerumen in the right ear and the tympanic membrane could not be visualized and showed non-occluding cerumen in the left ear.  Tympanometry results were consistent with in the right ear with no tympanic membrane mobility and in the left ear with normal tympanic membrane mobility.  Audiometric testing was not completed at today's evaluation due to cerumen.  Results:  The test results were reviewed with Zion and her granddaughter. Telisha and her granddaughter were counseled to schedule an appointment for cerumen removal. Joeleen was encouraged to schedule the appointment with her PCP or an ENT physician. Radiance was given information on Debrox drops and was encouraged to start using Debrox drops to help with cerumen removal. Lorin was given a list of hearing aid providers in the [place] area. Syann was encouraged to contact [facility] if interested in new amplification or assistive listening device options or follow up with [employer] for hearing aid re-programming. Rahma will schedule an audiological evaluation after cerumen management. A Pocket talker was demonstrated for Mischele however she  reported limited benefit.   Recommendations: 1.   Use of Debrox wax softening drops.  2.   Cerumen removal 3.  Audiological evaluation after cerumen removal 4.   Communication Needs Assessment with an Audiologist to discuss amplification and hearing aids.    20 minutes spent testing and counseling on results.   If you have any questions please feel free to contact me at [phone].  Darryle Posey Audiologist, Au.D., CCC-A 09/09/2024  1:21 PM  Cc: Katrinka Garnette KIDD, MD

## 2024-09-18 DIAGNOSIS — E1142 Type 2 diabetes mellitus with diabetic polyneuropathy: Secondary | ICD-10-CM | POA: Diagnosis not present

## 2024-09-18 DIAGNOSIS — B351 Tinea unguium: Secondary | ICD-10-CM | POA: Diagnosis not present

## 2024-09-18 DIAGNOSIS — L84 Corns and callosities: Secondary | ICD-10-CM | POA: Diagnosis not present

## 2024-09-24 DIAGNOSIS — Z974 Presence of external hearing-aid: Secondary | ICD-10-CM | POA: Diagnosis not present

## 2024-09-24 DIAGNOSIS — H6121 Impacted cerumen, right ear: Secondary | ICD-10-CM | POA: Diagnosis not present

## 2024-09-30 ENCOUNTER — Ambulatory Visit: Attending: Family Medicine | Admitting: Audiologist

## 2024-09-30 ENCOUNTER — Encounter: Payer: Self-pay | Admitting: Audiologist

## 2024-09-30 DIAGNOSIS — H903 Sensorineural hearing loss, bilateral: Secondary | ICD-10-CM | POA: Insufficient documentation

## 2024-09-30 DIAGNOSIS — H9193 Unspecified hearing loss, bilateral: Secondary | ICD-10-CM

## 2024-09-30 NOTE — Procedures (Signed)
°  Outpatient Audiology and [facility] [address] [place], [place]  [address] [phone]  AUDIOLOGICAL  EVALUATION  NAME: Linda Golden     DOB:   1927/08/06      MRN: [id number]                                                                                     DATE: 09/30/2024     REFERENT: Katrinka Garnette KIDD, MD STATUS: Outpatient DIAGNOSIS: Severe Sensorineural Hearing Loss    History: Linda Golden was seen for an audiological evaluation due to difficulty hearing. She has hearing aids from a [employer] like store. [name] does not wear them and does not like her hearing aids. She was seen previously at [facility] for a hearing test but had impacted wax. She has since had it removed but is still struggling to hear.  Linda Golden denies pain, pressure, or tinnitus.    Evaluation:  Otoscopy showed a clear view of the tympanic membranes, bilaterally. Cerumen removed and canals clear. Tympanometry results were consistent with normal middle ear function, bilaterally. Audiometric testing was completed using Conventional Audiometry techniques with insert earphones and supraural headphones. Test results are consistent with moderately severe sloping to profound sensorineural hearing loss bilaterally. Speech Recognition Thresholds were obtained at 60dB HL in the right ear and at 55dB HL in the left ear. SRT presented slowly with live voice. Word Recognition Testing was completed at 90dB, UCL for speech 95dB. Linda Golden scored 68% in the right ear and 52% in the left ear.    Results:  The test results were reviewed with Linda Golden and her granddaughter. Linda Golden has severe sensorineural hearing loss bilaterally. The difficulty hearing was not only due to wax. Linda Golden needs hearing aids that are powerful and worn regularly.  Audiogram printed and provided to [place].    Recommendations: Hearing aids recommended for both ears. Patient given list of local hearing aid providers. Counseled Linda Golden and  granddaughter on realistic expectations and necessity of consistent use for success.  If [name] is not willing to wear aids regularly, then Linda Golden LoudEar is an alternate option, demoed in office.  Annual audiometric testing recommended to monitor hearing loss for progression.    30 minutes spent testing and counseling on results.   If you have any questions please feel free to contact me at [phone].  Lauraine Ka Stalnaker Au.D.  Audiologist   09/30/2024  2:37 PM  Cc: Katrinka Garnette KIDD, MD

## 2024-11-21 ENCOUNTER — Ambulatory Visit: Admitting: Cardiology

## 2024-11-21 ENCOUNTER — Encounter: Payer: Self-pay | Admitting: Cardiology

## 2024-11-21 VITALS — BP 134/56 | HR 69 | Ht 65.0 in | Wt 114.2 lb

## 2024-11-21 DIAGNOSIS — I350 Nonrheumatic aortic (valve) stenosis: Secondary | ICD-10-CM

## 2024-11-21 DIAGNOSIS — I10 Essential (primary) hypertension: Secondary | ICD-10-CM

## 2024-11-21 DIAGNOSIS — I259 Chronic ischemic heart disease, unspecified: Secondary | ICD-10-CM

## 2024-11-21 DIAGNOSIS — I5020 Unspecified systolic (congestive) heart failure: Secondary | ICD-10-CM

## 2024-11-21 NOTE — Patient Instructions (Signed)
 Medication Instructions:   Your physician recommends that you continue on your current medications as directed. Please refer to the Current Medication list given to you today.   Labwork: None today  Testing/Procedures: None today  Follow-Up: 6 months Dr.McDowell  Any Other Special Instructions Will Be Listed Below (If Applicable).  If you need a refill on your cardiac medications before your next appointment, please call your pharmacy.

## 2024-11-21 NOTE — Progress Notes (Signed)
"  ° ° °  Cardiology Office Note  Date: 11/21/2024   ID: Linda Golden, DOB 1927-06-01, MRN [id number]  History of Present Illness: Linda Golden is a 89 y.o. female last seen in August 2025.  She is here today with family member for a follow-up visit.  Still living at home with assistance.  She does not report any chest pain, no recent falls or syncope.  I went over her medications, she reports compliance with therapy, no obvious intolerances or concerns.  She has a visit with her PCP later this month.  I reviewed her interval lab work.  I reviewed her ECG today which shows sinus rhythm with PVC and nonspecific ST-T changes.  Physical Exam: VS:  BP (!) 134/56   Pulse 69   Ht 5' 5 (1.651 m)   Wt 114 lb 3.2 oz (51.8 kg)   SpO2 98%   BMI 19.00 kg/m , BMI Body mass index is 19 kg/m.  Wt Readings from Last 3 Encounters:  11/21/24 114 lb 3.2 oz (51.8 kg)  08/01/24 110 lb 9.6 oz (50.2 kg)  06/05/24 112 lb 12.8 oz (51.2 kg)    General: Patient appears comfortable at rest. HEENT: Conjunctiva and lids normal. Neck: Supple, no elevated JVP or carotid bruits. Lungs: Clear to auscultation, nonlabored breathing at rest. Cardiac: Regular rate and rhythm, no S3, 1/6 systolic murmur. Extremities: No pitting edema.  ECG:  An ECG dated 01/06/2024 was personally reviewed today and demonstrated:  Sinus rhythm with decreased R wave progression and nonspecific ST changes.  Labwork: 12/03/2023: TSH 3.95 01/06/2024: B Natriuretic Peptide 2,396.4 01/08/2024: Magnesium 2.2 08/01/2024: ALT 11; AST 17; BUN 22; Creatinine, Ser 1.08; Hemoglobin 12.8; Platelets 184.0; Potassium 4.1; Sodium 141     Component Value Date/Time   CHOL 143 08/01/2024 1058   TRIG 122.0 08/01/2024 1058   HDL 52.90 08/01/2024 1058   CHOLHDL 3 08/01/2024 1058   VLDL 24.4 08/01/2024 1058   LDLCALC 66 08/01/2024 1058   LDLCALC 158 (H) 05/19/2022 1532   LDLDIRECT 225.0 05/30/2023 1045   Other Studies Reviewed Today:  No interval  cardiac testing for review today.  Assessment and Plan:  1.  Ischemic heart disease status post medically managed NSTEMI in February/March 2025.  Continuing medical therapy and observation at this point.  She denies any angina.  I reviewed her ECG.  Continue aspirin  81 mg daily, Lipitor 40 mg daily, and as needed nitroglycerin .   2.  HFrEF, LVEF 30 to 35% by echocardiogram in March 2025 with wall motion abnormalities consistent with ischemic cardiomyopathy.  No fluid retention, continue Cozaar  25 mg daily and Farxiga  10 mg daily.  Not candidate for ICD.   3.  Aortic stenosis, possibly at least moderate low flow/low gradient in the setting of cardiomyopathy.  Echocardiogram from March 2025 revealed mean AV gradient 7 mmHg with dimensionless index 0.5.  Would manage conservatively at this point.   4.  Severe RICA stenosis based on evaluation in March 2025.  VVS consultation at that time noted with recommendation for medical therapy.  Continue antiplatelet regimen and statin.   5.  Primary hypertension.  No change to current regimen.  Disposition:  Follow up 6 months.  Signed, Jayson JUDITHANN Sierras, M.D., F.A.C.C. [facility] "

## 2024-12-05 ENCOUNTER — Encounter: Admitting: Family Medicine

## 2025-05-12 ENCOUNTER — Ambulatory Visit
